# Patient Record
Sex: Female | Born: 1950 | Race: White | Hispanic: No | Smoking: Never smoker
Health system: Southern US, Community
[De-identification: ages and names within clinical notes are randomized; demographics above are authoritative.]

## PROBLEM LIST (undated history)

## (undated) DIAGNOSIS — E785 Hyperlipidemia, unspecified: Secondary | ICD-10-CM

## (undated) DIAGNOSIS — I499 Cardiac arrhythmia, unspecified: Secondary | ICD-10-CM

## (undated) DIAGNOSIS — F329 Major depressive disorder, single episode, unspecified: Secondary | ICD-10-CM

## (undated) DIAGNOSIS — M810 Age-related osteoporosis without current pathological fracture: Secondary | ICD-10-CM

## (undated) DIAGNOSIS — M1990 Unspecified osteoarthritis, unspecified site: Secondary | ICD-10-CM

## (undated) DIAGNOSIS — J189 Pneumonia, unspecified organism: Secondary | ICD-10-CM

## (undated) DIAGNOSIS — Z8719 Personal history of other diseases of the digestive system: Secondary | ICD-10-CM

## (undated) DIAGNOSIS — R197 Diarrhea, unspecified: Secondary | ICD-10-CM

## (undated) DIAGNOSIS — F32A Depression, unspecified: Secondary | ICD-10-CM

## (undated) DIAGNOSIS — L709 Acne, unspecified: Secondary | ICD-10-CM

## (undated) DIAGNOSIS — K635 Polyp of colon: Secondary | ICD-10-CM

## (undated) DIAGNOSIS — T884XXA Failed or difficult intubation, initial encounter: Secondary | ICD-10-CM

## (undated) DIAGNOSIS — K219 Gastro-esophageal reflux disease without esophagitis: Secondary | ICD-10-CM

## (undated) DIAGNOSIS — F419 Anxiety disorder, unspecified: Secondary | ICD-10-CM

## (undated) DIAGNOSIS — D649 Anemia, unspecified: Secondary | ICD-10-CM

## (undated) DIAGNOSIS — G4700 Insomnia, unspecified: Secondary | ICD-10-CM

## (undated) HISTORY — DX: Diarrhea, unspecified: R19.7

## (undated) HISTORY — PX: DIAGNOSTIC LAPAROSCOPY: SUR761

## (undated) HISTORY — DX: Polyp of colon: K63.5

## (undated) HISTORY — DX: Insomnia, unspecified: G47.00

## (undated) HISTORY — DX: Gastro-esophageal reflux disease without esophagitis: K21.9

## (undated) HISTORY — DX: Major depressive disorder, single episode, unspecified: F32.9

## (undated) HISTORY — PX: ABDOMINAL HYSTERECTOMY: SHX81

## (undated) HISTORY — DX: Hyperlipidemia, unspecified: E78.5

## (undated) HISTORY — PX: TONSILLECTOMY AND ADENOIDECTOMY: SHX28

## (undated) HISTORY — DX: Depression, unspecified: F32.A

## (undated) HISTORY — DX: Acne, unspecified: L70.9

## (undated) HISTORY — PX: TONSILLECTOMY: SUR1361

## (undated) HISTORY — PX: COLONOSCOPY W/ POLYPECTOMY: SHX1380

## (undated) HISTORY — DX: Age-related osteoporosis without current pathological fracture: M81.0

## (undated) HISTORY — PX: ENDOMETRIAL ABLATION: SHX621

---

## 1999-06-04 ENCOUNTER — Encounter: Admission: RE | Admit: 1999-06-04 | Discharge: 1999-06-04 | Payer: Self-pay | Admitting: Family Medicine

## 1999-10-15 ENCOUNTER — Encounter: Payer: Self-pay | Admitting: Family Medicine

## 1999-10-15 ENCOUNTER — Ambulatory Visit (HOSPITAL_COMMUNITY): Admission: RE | Admit: 1999-10-15 | Discharge: 1999-10-15 | Payer: Self-pay | Admitting: Family Medicine

## 1999-10-21 ENCOUNTER — Encounter: Payer: Self-pay | Admitting: Family Medicine

## 1999-10-21 ENCOUNTER — Ambulatory Visit (HOSPITAL_COMMUNITY): Admission: RE | Admit: 1999-10-21 | Discharge: 1999-10-21 | Payer: Self-pay | Admitting: Family Medicine

## 2000-04-03 ENCOUNTER — Other Ambulatory Visit: Admission: RE | Admit: 2000-04-03 | Discharge: 2000-04-03 | Payer: Self-pay | Admitting: Internal Medicine

## 2000-04-03 ENCOUNTER — Encounter (INDEPENDENT_AMBULATORY_CARE_PROVIDER_SITE_OTHER): Payer: Self-pay

## 2000-04-03 ENCOUNTER — Encounter: Payer: Self-pay | Admitting: Internal Medicine

## 2004-11-26 ENCOUNTER — Ambulatory Visit: Payer: Self-pay | Admitting: Family Medicine

## 2005-09-02 ENCOUNTER — Ambulatory Visit: Payer: Self-pay | Admitting: Internal Medicine

## 2005-09-24 ENCOUNTER — Emergency Department (HOSPITAL_COMMUNITY): Admission: EM | Admit: 2005-09-24 | Discharge: 2005-09-24 | Payer: Self-pay | Admitting: *Deleted

## 2005-09-29 ENCOUNTER — Ambulatory Visit: Payer: Self-pay | Admitting: Family Medicine

## 2005-11-22 ENCOUNTER — Ambulatory Visit: Payer: Self-pay | Admitting: Family Medicine

## 2005-11-29 ENCOUNTER — Ambulatory Visit: Payer: Self-pay | Admitting: Family Medicine

## 2005-12-13 ENCOUNTER — Ambulatory Visit: Payer: Self-pay | Admitting: Family Medicine

## 2006-04-18 ENCOUNTER — Ambulatory Visit: Payer: Self-pay | Admitting: Family Medicine

## 2006-06-20 ENCOUNTER — Ambulatory Visit: Payer: Self-pay | Admitting: Family Medicine

## 2006-09-28 ENCOUNTER — Ambulatory Visit: Payer: Self-pay | Admitting: Gastroenterology

## 2006-10-10 ENCOUNTER — Ambulatory Visit: Payer: Self-pay | Admitting: Gastroenterology

## 2006-11-14 ENCOUNTER — Ambulatory Visit: Payer: Self-pay | Admitting: Family Medicine

## 2006-11-14 LAB — CONVERTED CEMR LAB
ALT: 16 units/L (ref 0–40)
AST: 18 units/L (ref 0–37)
Albumin: 4.2 g/dL (ref 3.5–5.2)
Alkaline Phosphatase: 47 units/L (ref 39–117)
BUN: 16 mg/dL (ref 6–23)
Basophils Absolute: 0 10*3/uL (ref 0.0–0.1)
Basophils Relative: 0.1 % (ref 0.0–1.0)
Bilirubin, Direct: 0.1 mg/dL (ref 0.0–0.3)
CO2: 32 meq/L (ref 19–32)
Calcium: 9.7 mg/dL (ref 8.4–10.5)
Chloride: 108 meq/L (ref 96–112)
Cholesterol: 190 mg/dL (ref 0–200)
Creatinine, Ser: 0.9 mg/dL (ref 0.4–1.2)
Eosinophils Absolute: 0.1 10*3/uL (ref 0.0–0.6)
Eosinophils Relative: 3.3 % (ref 0.0–5.0)
GFR calc Af Amer: 83 mL/min
GFR calc non Af Amer: 69 mL/min
Glucose, Bld: 90 mg/dL (ref 70–99)
HCT: 35.7 % — ABNORMAL LOW (ref 36.0–46.0)
HDL: 77.2 mg/dL (ref >39.0)
Hemoglobin: 12.6 g/dL (ref 12.0–15.0)
LDL Cholesterol: 97 mg/dL (ref 0–99)
Lymphocytes Relative: 37.2 % (ref 12.0–46.0)
MCHC: 35.4 g/dL (ref 30.0–36.0)
MCV: 94.5 fL (ref 78.0–100.0)
Monocytes Absolute: 0.3 10*3/uL (ref 0.2–0.7)
Monocytes Relative: 9.1 % (ref 3.0–11.0)
Neutro Abs: 1.9 10*3/uL (ref 1.4–7.7)
Neutrophils Relative %: 50.3 % (ref 43.0–77.0)
Platelets: 283 10*3/uL (ref 150–400)
Potassium: 4.1 meq/L (ref 3.5–5.1)
RBC: 3.78 M/uL — ABNORMAL LOW (ref 3.87–5.11)
RDW: 12.6 % (ref 11.5–14.6)
Sodium: 143 meq/L (ref 135–145)
TSH: 1.31 microintl units/mL (ref 0.35–5.50)
Total Bilirubin: 1.1 mg/dL (ref 0.3–1.2)
Total CHOL/HDL Ratio: 2.5
Total Protein: 6.7 g/dL (ref 6.0–8.3)
Triglycerides: 79 mg/dL (ref 0–149)
VLDL: 16 mg/dL (ref 0–40)
WBC: 3.7 10*3/uL — ABNORMAL LOW (ref 4.5–10.5)

## 2006-11-28 ENCOUNTER — Ambulatory Visit: Payer: Self-pay | Admitting: Family Medicine

## 2007-03-28 DIAGNOSIS — G43909 Migraine, unspecified, not intractable, without status migrainosus: Secondary | ICD-10-CM | POA: Insufficient documentation

## 2007-03-28 DIAGNOSIS — L708 Other acne: Secondary | ICD-10-CM | POA: Insufficient documentation

## 2007-03-28 DIAGNOSIS — K219 Gastro-esophageal reflux disease without esophagitis: Secondary | ICD-10-CM

## 2007-03-28 DIAGNOSIS — R319 Hematuria, unspecified: Secondary | ICD-10-CM

## 2007-03-28 DIAGNOSIS — G4700 Insomnia, unspecified: Secondary | ICD-10-CM

## 2007-03-28 DIAGNOSIS — M810 Age-related osteoporosis without current pathological fracture: Secondary | ICD-10-CM

## 2007-03-28 HISTORY — DX: Gastro-esophageal reflux disease without esophagitis: K21.9

## 2007-03-28 HISTORY — DX: Hematuria, unspecified: R31.9

## 2007-03-28 HISTORY — DX: Age-related osteoporosis without current pathological fracture: M81.0

## 2007-05-31 ENCOUNTER — Encounter: Payer: Self-pay | Admitting: Family Medicine

## 2007-09-27 ENCOUNTER — Telehealth: Payer: Self-pay | Admitting: Family Medicine

## 2007-12-19 ENCOUNTER — Telehealth: Payer: Self-pay | Admitting: Family Medicine

## 2008-03-21 ENCOUNTER — Telehealth: Payer: Self-pay | Admitting: Family Medicine

## 2008-04-17 ENCOUNTER — Ambulatory Visit: Payer: Self-pay | Admitting: Family Medicine

## 2008-04-17 DIAGNOSIS — F329 Major depressive disorder, single episode, unspecified: Secondary | ICD-10-CM

## 2008-04-29 ENCOUNTER — Telehealth: Payer: Self-pay | Admitting: Family Medicine

## 2008-05-01 ENCOUNTER — Ambulatory Visit: Payer: Self-pay | Admitting: Family Medicine

## 2008-05-01 LAB — CONVERTED CEMR LAB
ALT: 18 units/L (ref 0–35)
AST: 16 units/L (ref 0–37)
Albumin: 4.5 g/dL (ref 3.5–5.2)
Alkaline Phosphatase: 50 units/L (ref 39–117)
BUN: 17 mg/dL (ref 6–23)
Basophils Absolute: 0 10*3/uL (ref 0.0–0.1)
Basophils Relative: 0.4 % (ref 0.0–3.0)
Bilirubin Urine: NEGATIVE
Bilirubin, Direct: 0.1 mg/dL (ref 0.0–0.3)
CO2: 31 meq/L (ref 19–32)
Calcium: 9.3 mg/dL (ref 8.4–10.5)
Chloride: 106 meq/L (ref 96–112)
Cholesterol: 176 mg/dL (ref 0–200)
Creatinine, Ser: 0.8 mg/dL (ref 0.4–1.2)
Eosinophils Absolute: 0.1 10*3/uL (ref 0.0–0.7)
Eosinophils Relative: 3.5 % (ref 0.0–5.0)
GFR calc Af Amer: 95 mL/min
GFR calc non Af Amer: 79 mL/min
Glucose, Bld: 80 mg/dL (ref 70–99)
Glucose, Urine, Semiquant: NEGATIVE
HCT: 37.3 % (ref 36.0–46.0)
HDL: 78.9 mg/dL (ref >39.0)
Hemoglobin: 12.7 g/dL (ref 12.0–15.0)
Ketones, urine, test strip: NEGATIVE
LDL Cholesterol: 83 mg/dL (ref 0–99)
Lymphocytes Relative: 33.7 % (ref 12.0–46.0)
MCHC: 34.2 g/dL (ref 30.0–36.0)
MCV: 98.2 fL (ref 78.0–100.0)
Monocytes Absolute: 0.3 10*3/uL (ref 0.1–1.0)
Monocytes Relative: 7.7 % (ref 3.0–12.0)
Neutro Abs: 2.4 10*3/uL (ref 1.4–7.7)
Neutrophils Relative %: 54.7 % (ref 43.0–77.0)
Nitrite: NEGATIVE
Platelets: 247 10*3/uL (ref 150–400)
Potassium: 4 meq/L (ref 3.5–5.1)
Protein, U semiquant: NEGATIVE
RBC: 3.8 M/uL — ABNORMAL LOW (ref 3.87–5.11)
RDW: 12.8 % (ref 11.5–14.6)
Sodium: 144 meq/L (ref 135–145)
Specific Gravity, Urine: 1.02
TSH: 1.22 microintl units/mL (ref 0.35–5.50)
Total Bilirubin: 0.7 mg/dL (ref 0.3–1.2)
Total CHOL/HDL Ratio: 2.2
Total Protein: 7.1 g/dL (ref 6.0–8.3)
Triglycerides: 73 mg/dL (ref 0–149)
Urobilinogen, UA: 0.2
VLDL: 15 mg/dL (ref 0–40)
WBC Urine, dipstick: NEGATIVE
WBC: 4.2 10*3/uL — ABNORMAL LOW (ref 4.5–10.5)
pH: 6

## 2008-05-08 ENCOUNTER — Ambulatory Visit: Payer: Self-pay | Admitting: Family Medicine

## 2008-05-08 DIAGNOSIS — Z860100 Personal history of colon polyps, unspecified: Secondary | ICD-10-CM

## 2008-05-08 HISTORY — DX: Personal history of colonic polyps: Z86.010

## 2008-05-08 HISTORY — DX: Personal history of colon polyps, unspecified: Z86.0100

## 2008-12-19 ENCOUNTER — Telehealth: Payer: Self-pay | Admitting: *Deleted

## 2008-12-23 ENCOUNTER — Telehealth: Payer: Self-pay | Admitting: Family Medicine

## 2009-05-20 ENCOUNTER — Encounter: Payer: Self-pay | Admitting: *Deleted

## 2009-07-24 ENCOUNTER — Ambulatory Visit: Payer: Self-pay | Admitting: Family Medicine

## 2009-07-24 LAB — CONVERTED CEMR LAB
ALT: 19 units/L (ref 0–35)
AST: 21 units/L (ref 0–37)
Albumin: 4 g/dL (ref 3.5–5.2)
Alkaline Phosphatase: 46 units/L (ref 39–117)
BUN: 15 mg/dL (ref 6–23)
Basophils Absolute: 0 10*3/uL (ref 0.0–0.1)
Basophils Relative: 0.7 % (ref 0.0–3.0)
Bilirubin Urine: NEGATIVE
Bilirubin, Direct: 0 mg/dL (ref 0.0–0.3)
CO2: 29 meq/L (ref 19–32)
Calcium: 9.3 mg/dL (ref 8.4–10.5)
Chloride: 111 meq/L (ref 96–112)
Cholesterol: 188 mg/dL (ref 0–200)
Creatinine, Ser: 0.8 mg/dL (ref 0.4–1.2)
Eosinophils Absolute: 0.1 10*3/uL (ref 0.0–0.7)
Eosinophils Relative: 3.5 % (ref 0.0–5.0)
GFR calc non Af Amer: 77.99 mL/min (ref >60)
Glucose, Bld: 75 mg/dL (ref 70–99)
HCT: 35.6 % — ABNORMAL LOW (ref 36.0–46.0)
HDL: 83.6 mg/dL (ref >39.00)
Hemoglobin: 11.6 g/dL — ABNORMAL LOW (ref 12.0–15.0)
Ketones, ur: NEGATIVE mg/dL
LDL Cholesterol: 87 mg/dL (ref 0–99)
Leukocytes, UA: NEGATIVE
Lymphocytes Relative: 44.2 % (ref 12.0–46.0)
Lymphs Abs: 1.6 10*3/uL (ref 0.7–4.0)
MCHC: 32.7 g/dL (ref 30.0–36.0)
MCV: 99.1 fL (ref 78.0–100.0)
Monocytes Absolute: 0.3 10*3/uL (ref 0.1–1.0)
Monocytes Relative: 7.6 % (ref 3.0–12.0)
Neutro Abs: 1.6 10*3/uL (ref 1.4–7.7)
Neutrophils Relative %: 44 % (ref 43.0–77.0)
Nitrite: NEGATIVE
Platelets: 248 10*3/uL (ref 150.0–400.0)
Potassium: 3.9 meq/L (ref 3.5–5.1)
RBC: 3.59 M/uL — ABNORMAL LOW (ref 3.87–5.11)
RDW: 13.3 % (ref 11.5–14.6)
Sodium: 145 meq/L (ref 135–145)
Specific Gravity, Urine: 1.02 (ref 1.000–1.030)
TSH: 1.26 microintl units/mL (ref 0.35–5.50)
Total Bilirubin: 0.5 mg/dL (ref 0.3–1.2)
Total CHOL/HDL Ratio: 2
Total Protein, Urine: NEGATIVE mg/dL
Total Protein: 6.7 g/dL (ref 6.0–8.3)
Triglycerides: 87 mg/dL (ref 0.0–149.0)
Urine Glucose: NEGATIVE mg/dL
Urobilinogen, UA: 0.2 (ref 0.0–1.0)
VLDL: 17.4 mg/dL (ref 0.0–40.0)
WBC: 3.6 10*3/uL — ABNORMAL LOW (ref 4.5–10.5)
pH: 5.5 (ref 5.0–8.0)

## 2009-08-03 ENCOUNTER — Ambulatory Visit: Payer: Self-pay | Admitting: Family Medicine

## 2009-08-03 ENCOUNTER — Encounter (INDEPENDENT_AMBULATORY_CARE_PROVIDER_SITE_OTHER): Payer: Self-pay | Admitting: *Deleted

## 2009-08-03 DIAGNOSIS — I059 Rheumatic mitral valve disease, unspecified: Secondary | ICD-10-CM

## 2009-08-03 HISTORY — DX: Rheumatic mitral valve disease, unspecified: I05.9

## 2009-08-10 ENCOUNTER — Ambulatory Visit: Payer: Self-pay | Admitting: Internal Medicine

## 2009-08-10 ENCOUNTER — Encounter: Payer: Self-pay | Admitting: Family Medicine

## 2009-08-26 ENCOUNTER — Ambulatory Visit: Payer: Self-pay | Admitting: Gastroenterology

## 2009-08-28 ENCOUNTER — Ambulatory Visit: Payer: Self-pay | Admitting: Gastroenterology

## 2009-09-01 ENCOUNTER — Encounter: Payer: Self-pay | Admitting: Gastroenterology

## 2009-12-16 ENCOUNTER — Telehealth: Payer: Self-pay | Admitting: Family Medicine

## 2010-05-05 ENCOUNTER — Encounter: Payer: Self-pay | Admitting: Family Medicine

## 2010-08-19 NOTE — Assessment & Plan Note (Signed)
Summary: cpx/no pap/njr/pt rescd//ccm   Vital Signs:  Patient profile:   60 year old female Height:      61.5 inches Weight:      124 pounds BMI:     23.13 Temp:     98.4 degrees F oral BP sitting:   130 / 72  (left arm) Cuff size:   regular  Vitals Entered By: Kern Reap CMA Duncan Dull) (August 03, 2009 2:06 PM)  Reason for Visit cpx  History of Present Illness: Tonya Pope is a 60 year old female, retired Engineer, civil (consulting) nonsmoker, who comes in today for evaluation of multiple issues.  She has a history of sleep dysfunction, for which he takes Ambien 10 mg nightly p.r.n.  She has trouble with vaginal dryness.  She uses Premarin vaginal cream twice weekly.  She also has migraine headaches, however, she's not had a migraine in over 6 months.  She takes calcium and vitamin D for bone health.  She takes Prilosec 20 mg q.a.m. for reflux esophagitis.  She also takes indomethacin 50 mg daily in the morning with food for chronic low back pain.  If he doesn't take the Indocin.  She hurts all day long.  However, the indomethacin is causing her to double up on her Prilosec to one twice a day.  She is requesting a GI consult for evaluation.  She has no difficulty swallowing.  She also takes Effexor, long-acting 75 mg q.a.m. for mild depression.  We also gave her a few Vicodin and Zofran to take p.r.n. for breakthrough migraine.  She does recheck here dental care.  Colonoscopy 2008 normal, tetanus, 2004, seasonal flu 2010.  She also has a history of fibrocystic breast disease skipped her mammogram in November.  Advised to call and get it set up now.  She had a uterus and right ovary removed for nonmalignant reasons  Allergies: 1)  Phenergan (Promethazine Hcl) 2)  Codeine Phosphate (Codeine Phosphate) 3)  Penicillin V Potassium (Penicillin V Potassium)  Past History:  Past medical, surgical, family and social histories (including risk factors) reviewed, and no changes noted (except as noted  below).  Past Medical History: Reviewed history from 05/08/2008 and no changes required. GERD Osteoporosis migraine headache acne insomnia asymptomatic hematuria Depression TAH nonmalignant reasons Colonic polyps, hx of  Past Surgical History: Reviewed history from 03/28/2007 and no changes required. Colon polypectomy Hysterectomy, TAH Tonsillectomy, adenoidectomy abdominal pain X2  Family History: Reviewed history from 05/08/2008 and no changes required. father had a history of hypertension, glaucoma.  Mother died at 56 from multiple medical problems.  She had strokes, arthritis, and diabetes, heart attack, hypertension.  One brother in good health.  Sister with a history of hypothyroidism and cholecystitis  Social History: Reviewed history from 04/17/2008 and no changes required.  Never Smoked Alcohol use-no Drug use-no Retired       2010  Review of Systems      See HPI  Physical Exam  General:  Well-developed,well-nourished,in no acute distress; alert,appropriate and cooperative throughout examination Head:  Normocephalic and atraumatic without obvious abnormalities. No apparent alopecia or balding. Eyes:  No corneal or conjunctival inflammation noted. EOMI. Perrla. Funduscopic exam benign, without hemorrhages, exudates or papilledema. Vision grossly normal. Ears:  External ear exam shows no significant lesions or deformities.  Otoscopic examination reveals clear canals, tympanic membranes are intact bilaterally without bulging, retraction, inflammation or discharge. Hearing is grossly normal bilaterally. Nose:  External nasal examination shows no deformity or inflammation. Nasal mucosa are pink and moist without lesions or  exudates. Mouth:  Oral mucosa and oropharynx without lesions or exudates.  Teeth in good repair. Neck:  No deformities, masses, or tenderness noted. Chest Wall:  No deformities, masses, or tenderness noted. Breasts:  No mass, nodules, thickening,  tenderness, bulging, retraction, inflamation, nipple discharge or skin changes noted.   Lungs:  Normal respiratory effort, chest expands symmetrically. Lungs are clear to auscultation, no crackles or wheezes. Heart:  the PMI is in the fifth intercostal space in the future.  Line.  No heaves, or thrills.  S1 is within normal limits.  There is a wide split S2 with a mitral valve regurgitation murmur Abdomen:  Bowel sounds positive,abdomen soft and non-tender without masses, organomegaly or hernias noted. Rectal:  No external abnormalities noted. Normal sphincter tone. No rectal masses or tenderness. Genitalia:  Pelvic Exam:        External: normal female genitalia without lesions or masses        Vagina: normal without lesions or masses        Cervix: normal without lesions or masses        Adnexa: normal bimanual exam without masses or fullness        Uterus: normal by palpation        Pap smear: not performed Msk:  No deformity or scoliosis noted of thoracic or lumbar spine.   Pulses:  R and L carotid,radial,femoral,dorsalis pedis and posterior tibial pulses are full and equal bilaterally Extremities:  No clubbing, cyanosis, edema, or deformity noted with normal full range of motion of all joints.   Neurologic:  No cranial nerve deficits noted. Station and gait are normal. Plantar reflexes are down-going bilaterally. DTRs are symmetrical throughout. Sensory, motor and coordinative functions appear intact. Skin:  Intact without suspicious lesions or rashes Cervical Nodes:  No lymphadenopathy noted Axillary Nodes:  No palpable lymphadenopathy Inguinal Nodes:  No significant adenopathy Psych:  Cognition and judgment appear intact. Alert and cooperative with normal attention span and concentration. No apparent delusions, illusions, hallucinations   Impression & Recommendations:  Problem # 1:  HEALTH MAINTENANCE EXAM (ICD-V70.0) Assessment Unchanged  Orders: Prescription Created  Electronically [id number])  Problem # 2:  DEPRESSION (ICD-311) Assessment: Improved  Her updated medication list for this problem includes:    Effexor Xr 75 Mg Xr24h-cap (Venlafaxine hcl) .Marland Kitchen... Take 1 tablet by mouth every morning  Orders: Prescription Created Electronically [phone])  Problem # 3:  INSOMNIA (ICD-780.52) Assessment: Improved  Her updated medication list for this problem includes:    Ambien 10 Mg Tabs (Zolpidem tartrate) .Marland Kitchen... Take 1 tablet by mouth at bedtime  Orders: Prescription Created Electronically [phone])  Problem # 4:  MIGRAINE HEADACHE (ICD-346.90) Assessment: Improved  Her updated medication list for this problem includes:    Indomethacin 50 Mg Caps (Indomethacin) .Marland Kitchen... Take 1 capsule by mouth once daily    Zomig Zmt 5 Mg Tbdp (Zolmitriptan) .Marland Kitchen... Take 1 tablet by mouth    Vicodin 5-500 Mg Tabs (Hydrocodone-acetaminophen) .Marland Kitchen... As needed  Orders: Prescription Created Electronically [phone])  Problem # 5:  OSTEOPOROSIS (ICD-733.00) Assessment: Unchanged  Orders: T-Bone Densitometry (65784) Prescription Created Electronically [phone])  Problem # 6:  GERD (ICD-530.81) Assessment: Deteriorated  Her updated medication list for this problem includes:    Prilosec Otc 20 Mg Tbec (Omeprazole magnesium) .Marland Kitchen... Daily  Orders: Prescription Created Electronically [phone]) Gastroenterology Referral (GI)  Problem # 7:  MITRAL VALVE PROLAPSE (ICD-424.0) Assessment: Unchanged  Orders: EKG w/ Interpretation (93000)  Complete Medication List: 1)  Ambien 10 Mg Tabs (  Zolpidem tartrate) .... Take 1 tablet by mouth at bedtime 2)  Indomethacin 50 Mg Caps (Indomethacin) .... Take 1 capsule by mouth once daily 3)  Premarin 0.625 Mg/gm Crea (Estrogens, conjugated) .... Insert 1 a small amount into vagina as directed 4)  Zomig Zmt 5 Mg Tbdp (Zolmitriptan) .... Take 1 tablet by mouth 5)  Caltrate 600+d 600-400 Mg-unit Tabs (Calcium carbonate-vitamin d) .... 2 daily 6)   Prilosec Otc 20 Mg Tbec (Omeprazole magnesium) .... Daily 7)  Colace 100 Mg Caps (Docusate sodium) .... Daily 8)  Vicodin 5-500 Mg Tabs (Hydrocodone-acetaminophen) .... As needed 9)  Effexor Xr 75 Mg Xr24h-cap (Venlafaxine hcl) .... Take 1 tablet by mouth every morning 10)  Lutein 6 Mg Caps (Lutein) .... Take one tab once daily 11)  Zofran 4 Mg Tabs (Ondansetron hcl) .... Take 1 tablet by mouth three times a day as needed nausea  Patient Instructions: 1)  continue your current medications, except take one Prilosec twice a day. 2)  We will get u  set up for a GI consult. 3)  Call to get set up  your mammogram ASAP. 4)  We will get you set up for a follow-up bone density Prescriptions: ZOFRAN 4 MG TABS (ONDANSETRON HCL) Take 1 tablet by mouth three times a day as needed nausea  #10 x 1   Entered and Authorized by:   Roderick Pee MD   Signed by:   Roderick Pee MD on 08/03/2009   Method used:   Print then Give to Patient   RxID:   [id number] EFFEXOR XR 75 MG XR24H-CAP (VENLAFAXINE HCL) Take 1 tablet by mouth every morning  #100 x 3   Entered and Authorized by:   Roderick Pee MD   Signed by:   Roderick Pee MD on 08/03/2009   Method used:   Print then Give to Patient   RxID:   [id number] VICODIN 5-500 MG  TABS (HYDROCODONE-ACETAMINOPHEN) as needed  #30 x 2   Entered and Authorized by:   Roderick Pee MD   Signed by:   Roderick Pee MD on 08/03/2009   Method used:   Print then Give to Patient   RxID:   [id number] ZOMIG ZMT 5 MG TBDP (ZOLMITRIPTAN) Take 1 tablet by mouth  #6 x 4   Entered and Authorized by:   Roderick Pee MD   Signed by:   Roderick Pee MD on 08/03/2009   Method used:   Print then Give to Patient   RxID:   [id number] PREMARIN 0.625 MG/GM CREA (ESTROGENS, CONJUGATED) Insert 1 a small amount into vagina as directed  #3 tubes x 4   Entered and Authorized by:   Roderick Pee MD   Signed by:   Roderick Pee MD on 08/03/2009   Method used:    Print then Give to Patient   RxID:   [id number] INDOMETHACIN 50 MG CAPS (INDOMETHACIN) Take 1 capsule by mouth once daily  #100 x 3   Entered and Authorized by:   Roderick Pee MD   Signed by:   Roderick Pee MD on 08/03/2009   Method used:   Print then Give to Patient   RxID:   [id number] AMBIEN 10 MG TABS (ZOLPIDEM TARTRATE) Take 1 tablet by mouth at bedtime  #100 x 3   Entered and Authorized by:   Roderick Pee MD   Signed by:   Roderick Pee MD on 08/03/2009  Method used:   Print then Give to Patient   RxID:   [id number]

## 2010-08-19 NOTE — Miscellaneous (Signed)
Summary: BONE DENSITY  Clinical Lists Changes  Orders: Added new Test order of T-Bone Densitometry (77080) - Signed Added new Test order of T-Lumbar Vertebral Assessment (77082) - Signed 

## 2010-08-19 NOTE — Letter (Signed)
Summary: New Patient letter  [facility]  [address] [place], [place] [address]   Phone: [phone]  Fax: [phone]       08/03/2009 MRN: [id number]  [facility] [address] [place], [place]  [address]  Dear Ms. Shankel,  Welcome to the Gastroenterology Division at [facility].    You are scheduled to see Dr.  Arlyce Dice on  08-26-09 at 11:15AM on the 3rd floor at [facility], [address]  We ask that you try to arrive at our office 15 minutes prior to your appointment time to allow for check-in.  We would like you to complete the enclosed self-administered evaluation form prior to your visit and bring it with you on the day of your appointment.  We will review it with you.  Also, please bring a complete list of all your medications or, if you prefer, bring the medication bottles and we will list them.  Please bring your insurance card so that we may make a copy of it.  If your insurance requires a referral to see a specialist, please bring your referral form from your primary care physician.  Co-payments are due at the time of your visit and may be paid by cash, check or credit card.     Your office visit will consist of a consult with your physician (includes a physical exam), any laboratory testing he/she may order, scheduling of any necessary diagnostic testing (e.g. x-ray, ultrasound, CT-scan), and scheduling of a procedure (e.g. Endoscopy, Colonoscopy) if required.  Please allow enough time on your schedule to allow for any/all of these possibilities.    If you cannot keep your appointment, please call [phone] to cancel or reschedule prior to your appointment date.  This allows [place] the opportunity to schedule an appointment for another patient in need of care.  If you do not cancel or reschedule by 5 p.m. the business day prior to your appointment date, you will be charged a $50.00 late cancellation/no-show fee.    Thank you for  choosing  Gastroenterology for your medical needs.  We appreciate the opportunity to care for you.  Please visit [place] at our website  to learn more about our practice.                     Sincerely,                                                             The Gastroenterology Division

## 2010-08-19 NOTE — Progress Notes (Signed)
Summary: refill  Phone Note Refill Request Message from:  Fax from Pharmacy on December 16, 2009 10:34 AM  Refills Requested: Medication #1:  EFFEXOR XR 75 MG XR24H-CAP Take 1 tablet by mouth every morning Initial call taken by: Kern Reap CMA Duncan Dull),  December 16, 2009 10:34 AM    Prescriptions: EFFEXOR XR 75 MG XR24H-CAP (VENLAFAXINE HCL) Take 1 tablet by mouth every morning  #100 x 3   Entered by:   Kern Reap CMA (AAMA)   Authorized by:   Roderick Pee MD   Signed by:   Kern Reap CMA (AAMA) on 12/16/2009   Method used:   Electronically to        [facility]* (mail-order)             ,          Ph: [phone]       Fax: [phone]   RxID:   [id number]

## 2010-08-19 NOTE — Procedures (Signed)
Summary: colonoscopy   Colonoscopy  Procedure date:  10/10/2006  Findings:      Results: Normal. Location:  [facility].    Comments:      Repeat colonoscopy in 10 years.    Colonoscopy  Procedure date:  10/10/2006  Findings:      Results: Normal. Location:  [facility].    Comments:      Repeat colonoscopy in 10 years.   Patient Name: Tonya Pope, Tonya Pope MRN: [id number] Procedure Procedures: Colonoscopy CPT: [phone].  Personnel: Endoscopist: Barbette Hair. Arlyce Dice, MD.  Patient Consent: Procedure, Alternatives, Risks and Benefits discussed, consent obtained, from patient.  Indications  Surveillance of: Adenomatous Polyp(s). 2004.  History  Current Medications: Patient is not currently taking Coumadin.  Pre-Exam Physical: Performed Oct 10, 2006. Cardio-pulmonary exam, HEENT exam , Abdominal exam, Mental status exam WNL.  Comments: Patient history reviewed/updated, physical performed prior to initiation of sedation?yes Exam Exam: Extent of exam reached: Cecum, extent intended: Cecum.  The cecum was identified by appendiceal orifice and IC valve. Time to Cecum: 00:04: 26. Time for Withdrawl: 00:06:05. Colon retroflexion performed. ASA Classification: I. Tolerance: good.  Monitoring: Pulse and BP monitoring, Oximetry used. Supplemental O2 given. at 2 Liters.  Colon Prep Used Miralax for colon prep. Prep results: good.  Sedation Meds: Patient assessed and found to be appropriate for moderate (conscious) sedation. Sedation was managed by the Endoscopist. Fentanyl 75 mcg. given IV. Versed 8 mg. given IV.  Findings - NORMAL EXAM: Cecum to Rectum.  NORMAL EXAM: Cecum.  NORMAL EXAM: Rectum.   Assessment Normal examination.  Events  Unplanned Interventions: No intervention was required.  Unplanned Events: There were no complications. Plans  Post Exam Instructions: Post sedation instructions given.  Patient Education: Patient given  standard instructions for: a normal exam.  Disposition: After procedure patient sent to recovery. After recovery patient sent home.  Scheduling/Referral: Colonoscopy, to Barbette Hair. Arlyce Dice, MD, around Oct 09, 2016.    This report was created from the original endoscopy report, which was reviewed and signed by the above listed endoscopist.    cc. Eugenio Hoes Todd,MD

## 2010-08-19 NOTE — Letter (Signed)
Summary: Results Letter  [facility]  [address] [place], [place] [address]   Phone: [phone]  Fax: [phone]        September 01, 2009 MRN: [id number]    [facility] [address] [place], [place]  [address]    Dear Tonya Pope,   Your biopsies demonstrated inflammatory changes only.    Please follow the recommendations previously discussed.  Should you have any immediate concerns or questions, feel free to contact me at the office.    Sincerely,  Barbette Hair. Arlyce Dice, M.D., [facility]          Sincerely,  Louis Meckel MD  This letter has been electronically signed by your physician.  Appended Document: Results Letter Letter mailed 2.16.11

## 2010-08-19 NOTE — Procedures (Signed)
Summary: Upper Endoscopy  Patient: Tonya Pope Note: All result statuses are Final unless otherwise noted.  Tests: (1) Upper Endoscopy (EGD)   EGD Upper Endoscopy       DONE     [facility]     [address]     [place], [place]  [address]           ENDOSCOPY PROCEDURE REPORT           PATIENT:  Tonya, Pope  MR#:  [id number]     BIRTHDATE:  1951/01/27, 58 yrs. old  GENDER:  female           ENDOSCOPIST:  Barbette Hair. Arlyce Dice, MD     Referred by:  Eugenio Hoes Tawanna Cooler, M.D.           PROCEDURE DATE:  08/28/2009     PROCEDURE:  EGD with biopsy     ASA CLASS:  Class II     INDICATIONS:  GERD           MEDICATIONS:   Fentanyl 50 mcg IV, Versed 10 mg IV, 0.6cc     simethancone 0.6 cc PO     TOPICAL ANESTHETIC:  Exactacain Spray           DESCRIPTION OF PROCEDURE:   After the risks benefits and     alternatives of the procedure were thoroughly explained, informed     consent was obtained.  The LB [id number] endoscope was     introduced through the mouth and advanced to the third portion of     the duodenum, without limitations.  The instrument was slowly     withdrawn as the mucosa was fully examined.     <<PROCEDUREIMAGES>>           irregular Z-line at the gastroesophageal junction. Multiple     biopsies were obtained and sent to pathology to r/o Barrett's     esophagus (see image2 and image1).  Otherwise the examination was     normal.    Retroflexed views revealed no abnormalities.    The     scope was then withdrawn from the patient and the procedure     completed.           COMPLICATIONS:  None           ENDOSCOPIC IMPRESSION:     1) Irregular Z-line at the gastroesophageal junction - r/o     Barrett's esophagus     2) Otherwise normal examination     RECOMMENDATIONS:     1) await biopsy results     2) continue current medications           REPEAT EXAM:  You will receive a letter from Dr. Arlyce Dice in 1-2     weeks, after reviewing the final  pathology, with followup     recommendations.           ______________________________     Barbette Hair Arlyce Dice, MD           CC:           n.     eSIGNED:   Barbette Hair. Calena Salem at 08/28/2009 09:13 AM           Ted, Leonhart, [id number]  Note: An exclamation mark (!) indicates a result that was not dispersed into the flowsheet. Document Creation Date: 08/28/2009 9:13 AM _______________________________________________________________________  (1) Order result status: Final Collection or observation date-time: 08/28/2009 09:06 Requested date-time:  Receipt date-time:  Reported date-time:  Referring Physician:   Ordering Physician: Melvia Heaps [phone]) Specimen Source:  Source: Launa Grill Order Number: [id number] Lab site:

## 2010-08-19 NOTE — Assessment & Plan Note (Signed)
Summary: REFLUX ESOPHAGITITS/YF    History of Present Illness Visit Type: Initial Consult Primary GI MD: Melvia Heaps MD [facility] Primary Provider: Kelle Darting, MD Requesting Provider: Kelle Darting, MD Chief Complaint: Patient having alot of heartburn which she has had for several years and is wanting to know is she might need a EGD. She is taking the Prilosec OTC and does have to increase it from time to time to a twice a day dose.  History of Present Illness:   Tonya Pope is a pleasant 60 year old white female referred at the request of Dr. Tawanna Cooler for evaluation of reflux.  She has had reflux for several years characterized by pyrosis.  This was initially controlled with an H2 receptor antagonist.  She was switched to Prilosec  which she has taken for several years.  She has occasional breakthrough pyrosis.  She denies dysphagia.   GI Review of Systems    Reports acid reflux, belching, and  bloating.      Denies abdominal pain, chest pain, dysphagia with liquids, dysphagia with solids, heartburn, loss of appetite, nausea, vomiting, vomiting blood, weight loss, and  weight gain.      Reports constipation.     Denies anal fissure, black tarry stools, change in bowel habit, diarrhea, diverticulosis, fecal incontinence, heme positive stool, hemorrhoids, irritable bowel syndrome, jaundice, light color stool, liver problems, rectal bleeding, and  rectal pain. Preventive Screening-Counseling & Management  Alcohol-Tobacco     Smoking Status: quit    Current Medications (verified): 1)  Ambien 10 Mg Tabs (Zolpidem Tartrate) .... Take 1/2  Tablet By Mouth At Bedtime 2)  Indomethacin 50 Mg Caps (Indomethacin) .... Take 1 Capsule By Mouth Once Daily 3)  Premarin 0.625 Mg/gm Crea (Estrogens, Conjugated) .... Insert 1 A Small Amount Into Vagina As Directed 4)  Zomig Zmt 5 Mg Tbdp (Zolmitriptan) .... Take 1 Tablet By Mouth 5)  Caltrate 600+d 600-400 Mg-Unit  Tabs (Calcium Carbonate-Vitamin D) .... 2  Daily 6)  Prilosec Otc 20 Mg  Tbec (Omeprazole Magnesium) .... Daily 7)  Colace 100 Mg  Caps (Docusate Sodium) .... Daily 8)  Vicodin 5-500 Mg  Tabs (Hydrocodone-Acetaminophen) .... As Needed 9)  Effexor Xr 75 Mg Xr24h-Cap (Venlafaxine Hcl) .... Take 1 Tablet By Mouth Every Morning 10)  Lutein 6 Mg Caps (Lutein) .... Take One Tab Once Daily 11)  Zofran 4 Mg Tabs (Ondansetron Hcl) .... Take 1 Tablet By Mouth Three Times A Day As Needed Nausea 12)  Caltrate+d Plus 1200-800 Mg-Unit Tabs .... Take Two By Mouth Once Daily 13)  Multivitamins  Tabs (Multiple Vitamin) .... Take One By Mouth Once Daily 14)  Feosol 200 (65 Fe) Mg Tabs (Ferrous Sulfate Dried) .... Take One By Mouth Once Daily  Allergies (verified): 1)  Phenergan (Promethazine Hcl) 2)  Codeine Phosphate (Codeine Phosphate) 3)  Penicillin V Potassium (Penicillin V Potassium)  Past History:  Past Medical History: Reviewed history from 08/25/2009 and no changes required. GERD Osteoporosis migraine headache acne insomnia asymptomatic hematuria Depression TAH nonmalignant reasons Colonic polyps, hx of 2001  Past Surgical History: Reviewed history from 03/28/2007 and no changes required. Colon polypectomy Hysterectomy, TAH Tonsillectomy, adenoidectomy abdominal pain X2  Family History: father had a history of hypertension, glaucoma.  Mother died at 75 from multiple medical problems.  She had strokes, arthritis, and diabetes, heart attack, hypertension.  One brother in good health.  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  Alcohol Use - yes 2 per day Smoking Status:  quit  Review of Systems       The patient complains of back pain, heart murmur, heart rhythm changes, and  sleeping problems.  The patient denies allergy/sinus, anemia, anxiety-new, arthritis/joint pain, blood in urine, breast changes/lumps, change in vision, confusion, cough, coughing up blood, depression-new, fainting, fatigue, fever, headaches-new, hearing problems, itching, menstrual pain, muscle pains/cramps, night sweats, nosebleeds, pregnancy symptoms, shortness of breath, skin rash, sore throat, swelling of feet/legs, swollen lymph glands, thirst - excessive , urination - excessive , urination changes/pain, urine leakage, vision changes, and voice change.    Vital Signs:  Patient profile:   60 year old female Height:      61 inches Weight:      125 pounds BMI:     23.70 Pulse rate:   64 / minute Pulse rhythm:   regular BP sitting:   118 / 62  (left arm) Cuff size:   regular  Vitals Entered By: Harlow Mares CMA Duncan Dull) (August 26, 2009 11:58 AM)  Physical Exam  Additional Exam:  on physical exam she is a healthy-appearing female  skin: anicteric HEENT: normocephalic; PEERLA; no nasal or pharyngeal abnormalities neck: supple nodes: no cervical lymphadenopathy chest: clear to ausculatation and percussion heart: no murmurs, gallops, or rubs; there is a mid systolic click abd: soft, nontender; BS normoactive; no abdominal masses, tenderness, organomegaly rectal: deferred ext: no cynanosis, clubbing, edema skeletal: no deformities neuro: oriented x 3; no focal abnormalities    Impression & Recommendations:  Problem # 1:  GERD (ICD-530.81)  This has been  a stable problem which is well controlled with PPI therapy.  Barrett's esophagus what to be ruled out.  Recommendations #1 continue Prilosec #2 upper endoscopy  Risks, alternatives, and complications of the procedure, including bleeding, perforation, and possible need for surgery, were explained to the patient.  Patient's questions were answered.  Orders: EGD (EGD)  Problem # 2:  MITRAL VALVE PROLAPSE  (ICD-424.0) Assessment: Comment Only  Patient Instructions: 1)  Conscious Sedation brochure given.  2)  Upper Endoscopy brochure given.  3)  GI Reflux brochure given.  4)  Continue Prilosec 5)  Your EGD is scheduled for 08/28/2009 at 8:30am 6)  The medication list was reviewed and reconciled.  All changed / newly prescribed medications were explained.  A complete medication list was provided to the patient / caregiver.

## 2010-08-19 NOTE — Miscellaneous (Signed)
Summary: flu vaccine   Clinical Lists Changes  Observations: Added new observation of FLU VAX: Historical (04/30/2010 11:52)        Immunization History:  Influenza Immunization History:    Influenza:  historical (04/30/2010)

## 2010-08-19 NOTE — Procedures (Signed)
Summary: colonoscopy   Colonoscopy  Procedure date:  04/03/2000  Findings:      Results: Polyp.  Location:  [facility].    Comments:      Repeat colonoscopy in 5 years.   Colonoscopy  Procedure date:  04/03/2000  Findings:      Results: Polyp.  Location:  [facility].    Comments:      Repeat colonoscopy in 5 years.  Patient Name: Tonya Pope, Tonya Pope MRN: [id number] Procedure Procedures: Colonoscopy CPT: [phone].    with polypectomy. CPT: [id number].  Personnel: Endoscopist: Wilhemina Bonito. Marina Goodell, MD.  Patient Consent: Procedure, Alternatives, Risks and Benefits discussed, consent obtained, Consent to be contacted was given Indications  Surveillance of: Last exam: Jul, 1996.  Average Risk Screening Routine.  History  Pre-Exam Physical: Performed Apr 03, 2000. Cardio-pulmonary exam, Rectal exam, HEENT exam , Abdominal exam, Extremity exam, Neurological exam WNL.  Exam Exam: Extent of exam reached: Cecum, extent intended: Cecum.  The cecum was identified by appendiceal orifice and IC valve. Colon retroflexion performed. Images taken. ASA Classification: I. Tolerance: excellent.  Monitoring: Pulse and BP monitoring, Oximetry used. Supplemental O2 given.  Colon Prep Used Golytely for colon prep. Prep results: excellent.  Fluoroscopy: Fluoroscopy was not used.  Sedation Meds: Fentanyl 150 mcg. Versed 13  Findings POLYP: Descending Colon, Maximum size: 5 mm. Procedure:  snare with cautery, removed, retrieved, Polyp sent to pathology. ICD9: Colon Polyps: 211.3.   Assessment Abnormal examination, see findings above.  Diagnoses: 211.3: Colon Polyps.   Events  Unplanned Interventions: No intervention was required.  Unplanned Events: There were no complications. Plans Disposition: After procedure patient sent to recovery. After recovery patient sent home.  Scheduling/Referral: Await pathology to schedule patient. Colonoscopy, to Wilhemina Bonito.  Marina Goodell, MD, around Apr 03, 2005.    This report was created from the original endoscopy report, which was reviewed and signed by the above listed endoscopist.

## 2010-08-19 NOTE — Letter (Signed)
Summary: New Patient letter  [facility]  [address] [place], [place] [address]   Phone: [phone]  Fax: [phone]       08/03/2009 MRN: [id number]  [facility] [address] [place], [place]  [address]  Dear Ms. Eisner,  Welcome to the Gastroenterology Division at [facility].    You are scheduled to see Dr.  Arlyce Dice on 08-26-09 at 11:15am on the 3rd floor at [facility], [address]  We ask that you try to arrive at our office 15 minutes prior to your appointment time to allow for check-in.  We would like you to complete the enclosed self-administered evaluation form prior to your visit and bring it with you on the day of your appointment.  We will review it with you.  Also, please bring a complete list of all your medications or, if you prefer, bring the medication bottles and we will list them.  Please bring your insurance card so that we may make a copy of it.  If your insurance requires a referral to see a specialist, please bring your referral form from your primary care physician.  Co-payments are due at the time of your visit and may be paid by cash, check or credit card.     Your office visit will consist of a consult with your physician (includes a physical exam), any laboratory testing he/she may order, scheduling of any necessary diagnostic testing (e.g. x-ray, ultrasound, CT-scan), and scheduling of a procedure (e.g. Endoscopy, Colonoscopy) if required.  Please allow enough time on your schedule to allow for any/all of these possibilities.    If you cannot keep your appointment, please call [phone] to cancel or reschedule prior to your appointment date.  This allows [place] the opportunity to schedule an appointment for another patient in need of care.  If you do not cancel or reschedule by 5 p.m. the business day prior to your appointment date, you will be charged a $50.00 late cancellation/no-show fee.    Thank you for choosing  [facility] for your medical needs.  We appreciate the opportunity to care for you.  Please visit [place] at our website  to learn more about our practice.                     Sincerely,                                                             The Gastroenterology Division

## 2010-08-19 NOTE — Letter (Signed)
Summary: EGD Instructions  [facility]  [address] [place], [place] [address]   Phone: [phone]  Fax: [phone]       Tonya Pope    02/10/1951    MRN: [id number]       Procedure Day /Date:FRIDAY 08/28/2009     Arrival Time: 7;30AM     Procedure Time:8:30AM     Location of Procedure:                    [facility] (4th Floor)    PREPARATION FOR ENDOSCOPY   On 2/112011 F THE PROCEDURE:  1.   No solid foods, milk or milk products are allowed after midnight the night before your procedure.  2.   Do not drink anything colored red or purple.  Avoid juices with pulp.  No orange juice.  3.  You may drink clear liquids until6:30AMh is 2 hours before your procedure.                                                                                                CLEAR LIQUIDS INCLUDE: Water Jello Ice Popsicles Tea (sugar ok, no milk/cream) Powdered fruit flavored drinks Coffee (sugar ok, no milk/cream) Gatorade Juice: apple, white grape, white cranberry  Lemonade Clear bullion, consomm, broth Carbonated beverages (any kind) Strained chicken noodle soup Hard Candy   MEDICATION INSTRUCTIONS  Unless otherwise instructed, you should take regular prescription medications with a small sip of water as early as possible the morning of your procedure.            OTHER INSTRUCTIONS  You will need a responsible adult at least 61 years of age to accompany you and drive you home.   This person must remain in the waiting room during your procedure.  Wear loose fitting clothing that is easily removed.  Leave jewelry and other valuables at home.  However, you may wish to bring a book to read or an iPod/MP3 player to listen to music as you wait for your procedure to start.  Remove all body piercing jewelry and leave at home.  Total time from sign-in until discharge is approximately 2-3 hours.  You should go home directly after your  procedure and rest.  You can resume normal activities the day after your procedure.  The day of your procedure you should not:   Drive   Make legal decisions   Operate machinery   Drink alcohol   Return to work  You will receive specific instructions about eating, activities and medications before you leave.    The above instructions have been reviewed and explained to me by   _______________________    I fully understand and can verbalize these instructions _____________________________ Date _________

## 2010-09-28 ENCOUNTER — Other Ambulatory Visit: Payer: Self-pay | Admitting: Family Medicine

## 2010-11-25 ENCOUNTER — Other Ambulatory Visit: Payer: Self-pay | Admitting: Family Medicine

## 2010-11-25 NOTE — Telephone Encounter (Signed)
Time for an office visit 

## 2010-12-29 ENCOUNTER — Other Ambulatory Visit: Payer: Self-pay | Admitting: Family Medicine

## 2011-01-04 ENCOUNTER — Telehealth: Payer: Self-pay | Admitting: *Deleted

## 2011-01-04 MED ORDER — VENLAFAXINE HCL ER 75 MG PO CP24: 75.0000 mg | ORAL_CAPSULE | Freq: Every day | ORAL | Status: DC

## 2011-01-04 NOTE — Telephone Encounter (Signed)
rx sent

## 2011-01-04 NOTE — Telephone Encounter (Signed)
Pt states [facility] advised her that refill request for effexor ER 75mg  was denied.  Pt states she has appt with Dr. Tawanna Cooler next month and needs refill to be approved.

## 2011-01-10 ENCOUNTER — Other Ambulatory Visit (INDEPENDENT_AMBULATORY_CARE_PROVIDER_SITE_OTHER): Payer: 59

## 2011-01-10 DIAGNOSIS — Z0000 Encounter for general adult medical examination without abnormal findings: Secondary | ICD-10-CM

## 2011-01-10 LAB — CBC WITH DIFFERENTIAL/PLATELET
Eosinophils Absolute: 0.1 10*3/uL (ref 0.0–0.7)
MCHC: 34.2 g/dL (ref 30.0–36.0)
MCV: 100.8 fl — ABNORMAL HIGH (ref 78.0–100.0)
Monocytes Absolute: 0.3 10*3/uL (ref 0.1–1.0)
Neutrophils Relative %: 46.2 % (ref 43.0–77.0)
Platelets: 184 10*3/uL (ref 150.0–400.0)
RDW: 13.9 % (ref 11.5–14.6)

## 2011-01-10 LAB — POCT URINALYSIS DIPSTICK
Ketones, UA: NEGATIVE
Leukocytes, UA: NEGATIVE
Nitrite, UA: NEGATIVE
Protein, UA: NEGATIVE
pH, UA: 6

## 2011-01-10 LAB — BASIC METABOLIC PANEL
BUN: 19 mg/dL (ref 6–23)
CO2: 28 mEq/L (ref 19–32)
Chloride: 106 mEq/L (ref 96–112)
Creatinine, Ser: 0.8 mg/dL (ref 0.4–1.2)
Glucose, Bld: 92 mg/dL (ref 70–99)

## 2011-01-10 LAB — LIPID PANEL
Cholesterol: 218 mg/dL — ABNORMAL HIGH (ref 0–200)
HDL: 101.6 mg/dL (ref >39.00)
Total CHOL/HDL Ratio: 2
Triglycerides: 36 mg/dL (ref 0.0–149.0)

## 2011-01-10 LAB — HEPATIC FUNCTION PANEL
Bilirubin, Direct: 0.1 mg/dL (ref 0.0–0.3)
Total Bilirubin: 0.6 mg/dL (ref 0.3–1.2)

## 2011-01-14 ENCOUNTER — Encounter: Payer: Self-pay | Admitting: Family Medicine

## 2011-01-17 ENCOUNTER — Encounter: Payer: Self-pay | Admitting: Family Medicine

## 2011-01-17 ENCOUNTER — Ambulatory Visit (INDEPENDENT_AMBULATORY_CARE_PROVIDER_SITE_OTHER): Payer: 59 | Admitting: Family Medicine

## 2011-01-17 DIAGNOSIS — G4700 Insomnia, unspecified: Secondary | ICD-10-CM

## 2011-01-17 DIAGNOSIS — M810 Age-related osteoporosis without current pathological fracture: Secondary | ICD-10-CM

## 2011-01-17 DIAGNOSIS — G43909 Migraine, unspecified, not intractable, without status migrainosus: Secondary | ICD-10-CM

## 2011-01-17 DIAGNOSIS — I059 Rheumatic mitral valve disease, unspecified: Secondary | ICD-10-CM

## 2011-01-17 DIAGNOSIS — K219 Gastro-esophageal reflux disease without esophagitis: Secondary | ICD-10-CM

## 2011-01-17 DIAGNOSIS — F329 Major depressive disorder, single episode, unspecified: Secondary | ICD-10-CM

## 2011-01-17 MED ORDER — ZOLPIDEM TARTRATE 10 MG PO TABS: 10.0000 mg | ORAL_TABLET | Freq: Every evening | ORAL | Status: DC | PRN

## 2011-01-17 MED ORDER — OMEPRAZOLE 20 MG PO CPDR: 20.0000 mg | DELAYED_RELEASE_CAPSULE | Freq: Every day | ORAL | Status: DC

## 2011-01-17 MED ORDER — INDOMETHACIN 50 MG PO CAPS: 50.0000 mg | ORAL_CAPSULE | Freq: Two times a day (BID) | ORAL | Status: DC

## 2011-01-17 MED ORDER — VENLAFAXINE HCL ER 75 MG PO CP24: 75.0000 mg | ORAL_CAPSULE | Freq: Every day | ORAL | Status: DC

## 2011-01-17 MED ORDER — HYDROCODONE-ACETAMINOPHEN 5-500 MG PO TABS: 1.0000 | ORAL_TABLET | Freq: Four times a day (QID) | ORAL | Status: DC | PRN

## 2011-01-17 MED ORDER — ESTROGENS, CONJUGATED 0.625 MG/GM VA CREA: 1.0000 g | TOPICAL_CREAM | Freq: Every day | VAGINAL | Status: DC

## 2011-01-17 NOTE — Progress Notes (Signed)
  Subjective:    Patient ID: Tonya Pope, female    DOB: 1950-10-29, 60 y.o.   MRN: [id number]  HPIPam is a 60 year old, married female, nonsmoker, who comes in today for a general physical examination because of the following issues She has a history of low bone density and takes calcium, vitamin D, and exercises on a regular basis.  She has a history of postmenopausal vaginal dryness for which he uses Premarin vaginal cream twice weekly.  She has a history of osteoarthritis, which he takes Indocin, 50 mg daily.  She has a history of reflux esophagitis, for which he takes Prozac 20 mg daily.  She is a history of mild depression, for which he takes Effexor 75 mg daily.  She also has a history of migraine headaches.  However, this year.  Her migraines have virtually stopped.  She also has a history of insomnia for which he takes Ambien 10 mg about 5 tabs per month.  She also takes over-the-counter magnesium, and Claritin for allergic rhinitis.  She does have trouble with snoring, but no fatigue.  No symptoms of sleep apnea.  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  She is off lactose and feels better.  Mammogram 2011, tetanus, 2004, colonoscopy, 2000, information given on shingles.     Review of Systems  Constitutional: Negative.   HENT: Negative.   Eyes: Negative.   Respiratory: Negative.   Cardiovascular: Negative.   Gastrointestinal: Negative.   Genitourinary: Negative.   Musculoskeletal: Negative.   Neurological: Negative.   Hematological: Negative.   Psychiatric/Behavioral: Negative.        Objective:   Physical Exam  Constitutional: She appears well-developed and well-nourished.  HENT:  Head: Normocephalic and atraumatic.  Right Ear: External ear normal.  Left Ear: External ear normal.  Nose: Nose normal.  Mouth/Throat: Oropharynx is clear and moist.  Eyes: EOM are normal. Pupils are equal, round, and reactive to light.  Neck: Normal  range of motion. Neck supple. No thyromegaly present.  Cardiovascular: Normal rate, regular rhythm, normal heart sounds and intact distal pulses.  Exam reveals no gallop and no friction rub.   No murmur heard. Pulmonary/Chest: Effort normal and breath sounds normal.  Abdominal: Soft. Bowel sounds are normal. She exhibits no distension and no mass. There is no tenderness. There is no rebound.  Genitourinary: Vagina normal. Guaiac negative stool. No vaginal discharge found.       Bilateral breast exam shows thickening in both breasts from the 3 to the 9 o'clock position on the periphery.  This is inconsistent in the past with her fibrocystic changes  Musculoskeletal: Normal range of motion.  Lymphadenopathy:    She has no cervical adenopathy.  Neurological: She is alert. She has normal reflexes. No cranial nerve deficit. She exhibits normal muscle tone. Coordination normal.  Skin: Skin is warm and dry.  Psychiatric: She has a normal mood and affect. Her behavior is normal. Judgment and thought content normal.          Assessment & Plan:  Healthy female.  Osteopenia.  Continue calcium, vitamin D, and exercise.  Postmenopausal vaginal dryness.  Continue determine vaginal cream.  Osteoarthritis.  Continue Indocin 50 mg daily.  Reflux esophagitis.  Continue Prilosec 20 daily.  Depression.  Continue Effexor 75 daily.  Migraine headaches have stopped.  Sleep dysfunction.  Ambien p.r.n.  Chest the takes an occasional Vicodin about one tab per month when she is having severe pain.

## 2011-01-17 NOTE — Patient Instructions (Signed)
Continue current medications.  Follow-up in one year or sooner if any problems remember to do a thorough breast exam monthly and get an annual mammogram

## 2011-12-08 ENCOUNTER — Other Ambulatory Visit: Payer: Self-pay | Admitting: Family Medicine

## 2012-01-05 ENCOUNTER — Other Ambulatory Visit: Payer: Self-pay | Admitting: Family Medicine

## 2012-03-22 ENCOUNTER — Other Ambulatory Visit: Payer: Self-pay | Admitting: Family Medicine

## 2012-03-29 ENCOUNTER — Other Ambulatory Visit: Payer: Self-pay | Admitting: Family Medicine

## 2012-04-06 ENCOUNTER — Other Ambulatory Visit: Payer: Self-pay | Admitting: Family Medicine

## 2012-04-13 ENCOUNTER — Other Ambulatory Visit (INDEPENDENT_AMBULATORY_CARE_PROVIDER_SITE_OTHER): Payer: 59

## 2012-04-13 DIAGNOSIS — Z0000 Encounter for general adult medical examination without abnormal findings: Secondary | ICD-10-CM

## 2012-04-13 LAB — CBC WITH DIFFERENTIAL/PLATELET
Basophils Absolute: 0 K/uL (ref 0.0–0.1)
Basophils Relative: 0.6 % (ref 0.0–3.0)
Eosinophils Absolute: 0.1 K/uL (ref 0.0–0.7)
Eosinophils Relative: 3.8 % (ref 0.0–5.0)
HCT: 36.4 % (ref 36.0–46.0)
Hemoglobin: 12 g/dL (ref 12.0–15.0)
Lymphocytes Relative: 34.1 % (ref 12.0–46.0)
Lymphs Abs: 1.2 K/uL (ref 0.7–4.0)
MCHC: 32.9 g/dL (ref 30.0–36.0)
MCV: 100.1 fl — ABNORMAL HIGH (ref 78.0–100.0)
Monocytes Absolute: 0.3 K/uL (ref 0.1–1.0)
Monocytes Relative: 8.5 % (ref 3.0–12.0)
Neutro Abs: 1.8 K/uL (ref 1.4–7.7)
Neutrophils Relative %: 53 % (ref 43.0–77.0)
Platelets: 190 K/uL (ref 150.0–400.0)
RBC: 3.64 Mil/uL — ABNORMAL LOW (ref 3.87–5.11)
RDW: 13.8 % (ref 11.5–14.6)
WBC: 3.4 K/uL — ABNORMAL LOW (ref 4.5–10.5)

## 2012-04-13 LAB — BASIC METABOLIC PANEL
CO2: 28 mEq/L (ref 19–32)
Chloride: 105 mEq/L (ref 96–112)
Glucose, Bld: 95 mg/dL (ref 70–99)
Potassium: 4.9 mEq/L (ref 3.5–5.1)
Sodium: 140 mEq/L (ref 135–145)

## 2012-04-13 LAB — HEPATIC FUNCTION PANEL
AST: 23 U/L (ref 0–37)
Albumin: 4.3 g/dL (ref 3.5–5.2)

## 2012-04-13 LAB — LDL CHOLESTEROL, DIRECT: Direct LDL: 82.2 mg/dL

## 2012-04-13 LAB — LIPID PANEL
HDL: 97.7 mg/dL (ref >39.00)
Triglycerides: 41 mg/dL (ref 0.0–149.0)

## 2012-04-13 LAB — POCT URINALYSIS DIPSTICK
Bilirubin, UA: NEGATIVE
Glucose, UA: NEGATIVE
Nitrite, UA: NEGATIVE

## 2012-04-13 LAB — TSH: TSH: 1.39 u[IU]/mL (ref 0.35–5.50)

## 2012-05-01 ENCOUNTER — Encounter: Payer: Self-pay | Admitting: Family Medicine

## 2012-05-01 ENCOUNTER — Ambulatory Visit (INDEPENDENT_AMBULATORY_CARE_PROVIDER_SITE_OTHER): Payer: 59 | Admitting: Family Medicine

## 2012-05-01 VITALS — BP 120/80 | Temp 98.7°F | Ht 63.0 in | Wt 132.0 lb

## 2012-05-01 DIAGNOSIS — G4700 Insomnia, unspecified: Secondary | ICD-10-CM

## 2012-05-01 DIAGNOSIS — K219 Gastro-esophageal reflux disease without esophagitis: Secondary | ICD-10-CM

## 2012-05-01 DIAGNOSIS — R319 Hematuria, unspecified: Secondary | ICD-10-CM

## 2012-05-01 DIAGNOSIS — G43909 Migraine, unspecified, not intractable, without status migrainosus: Secondary | ICD-10-CM

## 2012-05-01 DIAGNOSIS — I059 Rheumatic mitral valve disease, unspecified: Secondary | ICD-10-CM

## 2012-05-01 DIAGNOSIS — M810 Age-related osteoporosis without current pathological fracture: Secondary | ICD-10-CM

## 2012-05-01 DIAGNOSIS — Z0000 Encounter for general adult medical examination without abnormal findings: Secondary | ICD-10-CM

## 2012-05-01 DIAGNOSIS — F329 Major depressive disorder, single episode, unspecified: Secondary | ICD-10-CM

## 2012-05-01 DIAGNOSIS — Z23 Encounter for immunization: Secondary | ICD-10-CM

## 2012-05-01 DIAGNOSIS — N760 Acute vaginitis: Secondary | ICD-10-CM

## 2012-05-01 MED ORDER — OMEPRAZOLE 20 MG PO CPDR: 20.0000 mg | DELAYED_RELEASE_CAPSULE | Freq: Every day | ORAL | Status: DC

## 2012-05-01 MED ORDER — ZOLPIDEM TARTRATE 5 MG PO TABS: ORAL_TABLET | ORAL | Status: DC

## 2012-05-01 MED ORDER — VENLAFAXINE HCL ER 75 MG PO CP24: 75.0000 mg | ORAL_CAPSULE | Freq: Every day | ORAL | Status: DC

## 2012-05-01 MED ORDER — TRAMADOL HCL 50 MG PO TABS: 50.0000 mg | ORAL_TABLET | Freq: Three times a day (TID) | ORAL | Status: DC | PRN

## 2012-05-01 MED ORDER — ESTROGENS, CONJUGATED 0.625 MG/GM VA CREA: 1.0000 g | TOPICAL_CREAM | Freq: Every day | VAGINAL | Status: DC

## 2012-05-01 MED ORDER — INDOMETHACIN 50 MG PO CAPS: 50.0000 mg | ORAL_CAPSULE | Freq: Two times a day (BID) | ORAL | Status: DC

## 2012-05-01 NOTE — Progress Notes (Signed)
  Subjective:    Patient ID: Tonya Pope, female    DOB: 22-Jun-1951, 61 y.o.   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 We discussed switching from Vicodin to tramadol  Because of some osteoarthritis she takes indomethacin 50 mg twice a day with food. She also takes Claritin 10 mg for allergic round nidus, Prilosec 20 mg for reflux and Effexor long-acting 75 mg daily for mild depression  He takes Ambien 5 mg one half tab each bedtime for chronic insomnia.  She gets routine eye care, dental care, BSE monthly, overdue for mammogram, colonoscopy and GI, tetanus booster 2004, we booster today, seasonal flu shot today.   Review of Systems  Constitutional: Negative.   HENT: Negative.   Eyes: Negative.   Respiratory: Negative.   Cardiovascular: Negative.   Gastrointestinal: Negative.   Genitourinary: Negative.   Musculoskeletal: Negative.   Neurological: Negative.   Hematological: Negative.   Psychiatric/Behavioral: Negative.        Objective:   Physical Exam  Constitutional: She appears well-developed and well-nourished.  HENT:  Head: Normocephalic and atraumatic.  Right Ear: External ear normal.  Left Ear: External ear normal.  Nose: Nose normal.  Mouth/Throat: Oropharynx is clear and moist.  Eyes: EOM are normal. Pupils are equal, round, and reactive to light.  Neck: Normal range of motion. Neck supple. No thyromegaly present.  Cardiovascular: Normal rate, regular rhythm, normal heart sounds and intact distal pulses.  Exam reveals no gallop and no friction rub.   No murmur heard. Pulmonary/Chest: Effort normal and breath sounds normal.    Abdominal: Soft. Bowel sounds are normal. She exhibits no distension and no mass. There is no tenderness. There is no rebound.  Genitourinary: Vagina normal. Guaiac negative stool. No vaginal discharge found.       Bilateral breast exam normal except for diffuse fibrocystic changes  Musculoskeletal: Normal range of motion.  Lymphadenopathy:    She has no cervical adenopathy.  Neurological: She is alert. She has normal reflexes. No cranial nerve deficit. She exhibits normal muscle tone. Coordination normal.  Skin: Skin is warm and dry.  Psychiatric: She has a normal mood and affect. Her behavior is normal. Judgment and thought content normal.          Assessment & Plan:  Healthy female  Postmenopausal vaginal dryness continue hormonal cream twice weekly  Neck pain tramadol when necessary  Osteoarthritis indomethacin 50 mg twice a day  Allergic rhinitis Claritin 10 mg daily  Reflux esophagitis Prilosec 20 mg daily  Mild depression continued continue Effexor 75 mg long acting daily  Chronic insomnia continue Ambien 5 mg one half tab each bedtime  Migraine headaches resolved none x2 years  Fibrocystic breast changes and encouraged BSE monthly and you mammography

## 2012-05-01 NOTE — Patient Instructions (Signed)
Return in one year or sooner if any problems  Remember to call and get set up for your mammogram this fall

## 2012-06-28 ENCOUNTER — Other Ambulatory Visit: Payer: Self-pay | Admitting: Family Medicine

## 2012-07-13 ENCOUNTER — Other Ambulatory Visit: Payer: Self-pay | Admitting: Family Medicine

## 2012-08-08 ENCOUNTER — Emergency Department (HOSPITAL_COMMUNITY)
Admission: EM | Admit: 2012-08-08 | Discharge: 2012-08-08 | Disposition: A | Discharge status: Home / Self Care | Payer: 59 | Attending: Emergency Medicine | Admitting: Emergency Medicine

## 2012-08-08 ENCOUNTER — Emergency Department (HOSPITAL_COMMUNITY): Payer: 59

## 2012-08-08 ENCOUNTER — Encounter (HOSPITAL_COMMUNITY): Payer: Self-pay | Admitting: Emergency Medicine

## 2012-08-08 DIAGNOSIS — IMO0002 Reserved for concepts with insufficient information to code with codable children: Secondary | ICD-10-CM | POA: Insufficient documentation

## 2012-08-08 DIAGNOSIS — Y92009 Unspecified place in unspecified non-institutional (private) residence as the place of occurrence of the external cause: Secondary | ICD-10-CM | POA: Insufficient documentation

## 2012-08-08 DIAGNOSIS — Z8679 Personal history of other diseases of the circulatory system: Secondary | ICD-10-CM | POA: Insufficient documentation

## 2012-08-08 DIAGNOSIS — Z8659 Personal history of other mental and behavioral disorders: Secondary | ICD-10-CM | POA: Insufficient documentation

## 2012-08-08 DIAGNOSIS — S139XXA Sprain of joints and ligaments of unspecified parts of neck, initial encounter: Secondary | ICD-10-CM | POA: Insufficient documentation

## 2012-08-08 DIAGNOSIS — Z872 Personal history of diseases of the skin and subcutaneous tissue: Secondary | ICD-10-CM | POA: Insufficient documentation

## 2012-08-08 DIAGNOSIS — S060X9A Concussion with loss of consciousness of unspecified duration, initial encounter: Secondary | ICD-10-CM | POA: Insufficient documentation

## 2012-08-08 DIAGNOSIS — T148XXA Other injury of unspecified body region, initial encounter: Secondary | ICD-10-CM

## 2012-08-08 DIAGNOSIS — G4700 Insomnia, unspecified: Secondary | ICD-10-CM | POA: Insufficient documentation

## 2012-08-08 DIAGNOSIS — M810 Age-related osteoporosis without current pathological fracture: Secondary | ICD-10-CM | POA: Insufficient documentation

## 2012-08-08 DIAGNOSIS — Y9389 Activity, other specified: Secondary | ICD-10-CM | POA: Insufficient documentation

## 2012-08-08 DIAGNOSIS — S161XXA Strain of muscle, fascia and tendon at neck level, initial encounter: Secondary | ICD-10-CM

## 2012-08-08 DIAGNOSIS — S0003XA Contusion of scalp, initial encounter: Secondary | ICD-10-CM | POA: Insufficient documentation

## 2012-08-08 DIAGNOSIS — S060XAA Concussion with loss of consciousness status unknown, initial encounter: Secondary | ICD-10-CM | POA: Insufficient documentation

## 2012-08-08 DIAGNOSIS — Z87891 Personal history of nicotine dependence: Secondary | ICD-10-CM | POA: Insufficient documentation

## 2012-08-08 DIAGNOSIS — K219 Gastro-esophageal reflux disease without esophagitis: Secondary | ICD-10-CM | POA: Insufficient documentation

## 2012-08-08 DIAGNOSIS — Z860100 Personal history of colon polyps, unspecified: Secondary | ICD-10-CM | POA: Insufficient documentation

## 2012-08-08 DIAGNOSIS — W010XXA Fall on same level from slipping, tripping and stumbling without subsequent striking against object, initial encounter: Secondary | ICD-10-CM | POA: Insufficient documentation

## 2012-08-08 DIAGNOSIS — S1093XA Contusion of unspecified part of neck, initial encounter: Secondary | ICD-10-CM | POA: Insufficient documentation

## 2012-08-08 DIAGNOSIS — Z7982 Long term (current) use of aspirin: Secondary | ICD-10-CM | POA: Insufficient documentation

## 2012-08-08 MED ORDER — METHOCARBAMOL 500 MG PO TABS: 500.0000 mg | ORAL_TABLET | Freq: Two times a day (BID) | ORAL | Status: DC

## 2012-08-08 MED ORDER — TRAMADOL HCL 50 MG PO TABS
50.0000 mg | ORAL_TABLET | Freq: Once | ORAL | Status: AC
  Administered 2012-08-08: 50 mg via ORAL
  Filled 2012-08-08: qty 1

## 2012-08-08 MED ORDER — FAMOTIDINE 20 MG PO TABS: 20.0000 mg | ORAL_TABLET | Freq: Two times a day (BID) | ORAL | Status: DC

## 2012-08-08 MED ORDER — TRAMADOL HCL 50 MG PO TABS: 50.0000 mg | ORAL_TABLET | Freq: Four times a day (QID) | ORAL | Status: DC | PRN

## 2012-08-08 MED ORDER — METHOCARBAMOL 500 MG PO TABS
500.0000 mg | ORAL_TABLET | Freq: Once | ORAL | Status: AC
  Administered 2012-08-08: 500 mg via ORAL
  Filled 2012-08-08: qty 1

## 2012-08-08 MED ORDER — IBUPROFEN 600 MG PO TABS: 600.0000 mg | ORAL_TABLET | Freq: Four times a day (QID) | ORAL | Status: DC | PRN

## 2012-08-08 NOTE — ED Notes (Signed)
Pt transported to radiology.

## 2012-08-08 NOTE — ED Notes (Signed)
Per EMS pt came from home. Pt was checking her mail and slipped backward on ice and hit the back of her head. No LOC, no neck or back pain. Pt is Ox4. Hematoma noted to back of head. No other injuries or deformities noted.

## 2012-08-08 NOTE — ED Notes (Signed)
Pt returned from radiology.

## 2012-08-08 NOTE — ED Provider Notes (Signed)
History     CSN: [id number]  Arrival date & time 08/08/12  1329   First MD Initiated Contact with Patient 08/08/12 1347      Chief Complaint  Patient presents with  . Fall    (Consider location/radiation/quality/duration/timing/severity/associated sxs/prior treatment) HPI Comments: Pt cokes in with cc of fall. Pt states that stepped outside her home, slipped due to the ice and fell right on to her back and hit her head. She has a headache in the back of her head, and tail bone pain with some lateral pain over the neck. Pt has no nausea, vomiting, visual complains, seizures, altered mental status, loss of consciousness, new weakness, or numbness, no gait instability. Pt has ambulated sicne the fall.  Patient is a 62 y.o. female presenting with fall. The history is provided by the patient.  Fall Associated symptoms include headaches. Pertinent negatives include no abdominal pain, no nausea and no vomiting.    Past Medical History  Diagnosis Date  . GERD (gastroesophageal reflux disease)   . OP (osteoporosis)   . Migraine   . Acne   . Insomnia   . Depression   . Colon polyps     Past Surgical History  Procedure Date  . Abdominal hysterectomy   . Colonoscopy w/ polypectomy   . Tonsillectomy and adenoidectomy     Family History  Problem Relation Age of Onset  . Stroke Mother   . Arthritis Mother   . Diabetes Mother   . Heart disease Mother   . Hypertension Mother   . Hyperlipidemia Father   . Glaucoma Father   . Thyroid disease Sister   . Cancer Other     breast,colon    History  Substance Use Topics  . Smoking status: Former Games developer  . Smokeless tobacco: Not on file  . Alcohol Use:     OB History    Grav Para Term Preterm Abortions TAB SAB Ect Mult Living                  Review of Systems  Constitutional: Negative for activity change.  HENT: Negative for neck pain.   Respiratory: Negative for shortness of breath.   Cardiovascular: Negative for  chest pain.  Gastrointestinal: Negative for nausea, vomiting and abdominal pain.  Genitourinary: Negative for dysuria.  Musculoskeletal: Positive for myalgias and back pain.  Neurological: Positive for headaches.    Allergies  Codeine phosphate; Penicillins; and Promethazine hcl  Home Medications   Current Outpatient Rx  Name  Route  Sig  Dispense  Refill  . ASPIRIN-ACETAMINOPHEN-CAFFEINE 250-250-65 MG PO TABS   Oral   Take 1 tablet by mouth every 6 (six) hours as needed. For migraine         . ONE-A-DAY CALCIUM PLUS PO   Oral   Take 1 tablet by mouth daily.          Marland Kitchen ESTROGENS, CONJUGATED 0.625 MG/GM VA CREA   Vaginal   Place 0.25 Applicatorfuls vaginally daily.   42.5 g   11   . INDOMETHACIN 50 MG PO CAPS   Oral   Take 1 capsule (50 mg total) by mouth 2 (two) times daily with a meal.   200 capsule   3   . OMEPRAZOLE 20 MG PO CPDR   Oral   Take 1 capsule (20 mg total) by mouth daily.   100 capsule   3   . TRAMADOL HCL 50 MG PO TABS   Oral   Take 1  tablet (50 mg total) by mouth every 8 (eight) hours as needed for pain.   50 tablet   3   . VENLAFAXINE HCL ER 75 MG PO CP24   Oral   Take 1 capsule (75 mg total) by mouth daily.   100 capsule   3   . ZOLPIDEM TARTRATE 5 MG PO TABS   Oral   Take 2.5 mg by mouth at bedtime as needed. For sleep           BP 147/75  Pulse 73  Temp 98.6 F (37 C) (Oral)  Resp 16  SpO2 99%  Physical Exam  Nursing note and vitals reviewed. Constitutional: She is oriented to person, place, and time. She appears well-developed and well-nourished.  HENT:  Head: Normocephalic and atraumatic.       No midline c-spine tenderness, pt able to turn head to 45 degrees bilaterally without any pain and able to flex neck to the chest and extend without any pain or neurologic symptoms.  Eyes: EOM are normal. Pupils are equal, round, and reactive to light.  Neck: Neck supple.  Cardiovascular: Normal rate, regular rhythm and normal  heart sounds.   No murmur heard. Pulmonary/Chest: Effort normal. No respiratory distress.  Abdominal: Soft. She exhibits no distension. There is no tenderness. There is no rebound and no guarding.  Musculoskeletal:       Head to toe evaluation shows + hematoma over the occiput, but no bleeding of the scalp, no facial abrasions, step offs, crepitus, no tenderness to palpation of the bilateral upper and lower extremities, no gross deformities, no chest tenderness, no pelvic pain.  Pt also has coccyx pain, and slight tenderness lateral to the coccyx  Neurological: She is alert and oriented to person, place, and time.  Skin: Skin is warm and dry.    ED Course  Procedures (including critical care time)  Labs Reviewed - No data to display Dg Sacrum/coccyx  08/08/2012  *RADIOLOGY REPORT*  Clinical Data: Larey Seat.  Sacral pain.  SACRUM AND COCCYX - 2+ VIEW  Comparison: None  Findings: The pubic symphysis and SI joints are intact.  The sacrum appears normal.  No definite fracture of the sacrum or coccyx.  No pubic rami fractures.  IMPRESSION: No acute bony findings.   Original Report Authenticated By: Rudie Meyer, M.D.    Ct Head Wo Contrast  08/08/2012  *RADIOLOGY REPORT*  Clinical Data: Larey Seat and hit head  CT HEAD WITHOUT CONTRAST  Technique:  Contiguous axial images were obtained from the base of the skull through the vertex without contrast.  Comparison: None.  Findings: Ventricle size is normal.  Mild chronic microvascular ischemic change in the white matter.  No acute infarct.  Negative for hemorrhage or mass lesion.  No subdural hemorrhage is present. Negative for skull fracture.  IMPRESSION: No acute abnormality.   Original Report Authenticated By: Janeece Riggers, M.D.      No diagnosis found.    MDM  DDx includes: - Mechanical falls - ICH - Fractures - Contusions - Soft tissue injury  Pt comes in with cc of fall. Will get CT head due to hematoma and headaches. Cspine cleared  clinically.  Will get sacral xrays.  Suspect contusions.  Derwood Kaplan, MD 08/08/12 1507

## 2012-10-04 ENCOUNTER — Telehealth: Payer: Self-pay | Admitting: Family Medicine

## 2012-10-04 DIAGNOSIS — N760 Acute vaginitis: Secondary | ICD-10-CM

## 2012-10-04 MED ORDER — ESTROGENS, CONJUGATED 0.625 MG/GM VA CREA: 1.0000 g | TOPICAL_CREAM | Freq: Every day | VAGINAL | Status: DC

## 2012-10-04 MED ORDER — ZOLPIDEM TARTRATE 5 MG PO TABS: 2.5000 mg | ORAL_TABLET | Freq: Every evening | ORAL | Status: DC | PRN

## 2012-10-04 NOTE — Telephone Encounter (Signed)
Pt called and stated that her insurance switched to [employer] for mail services. They are requesting that a copy of her zolpidem (AMBIEN) 5 MG tablet RX, and her conjugated estrogens (PREMARIN) vaginal cream RX be faxed to them. Please assist.

## 2012-10-05 NOTE — Telephone Encounter (Signed)
Rx faxed to pharmacy  

## 2013-02-06 ENCOUNTER — Telehealth: Payer: Self-pay | Admitting: Family Medicine

## 2013-02-06 DIAGNOSIS — M810 Age-related osteoporosis without current pathological fracture: Secondary | ICD-10-CM

## 2013-02-06 MED ORDER — INDOMETHACIN 50 MG PO CAPS: 50.0000 mg | ORAL_CAPSULE | Freq: Every day | ORAL | Status: DC

## 2013-02-06 NOTE — Telephone Encounter (Signed)
Pt needs indocin 50 mg once a day #90 with 3 refills sent to [facility]. Pt stated she does not take med twice a day.

## 2013-02-08 ENCOUNTER — Telehealth: Payer: Self-pay | Admitting: Family Medicine

## 2013-02-08 DIAGNOSIS — M810 Age-related osteoporosis without current pathological fracture: Secondary | ICD-10-CM

## 2013-02-08 NOTE — Telephone Encounter (Signed)
Rx was sent to [facility] so Rx was not sent to [facility]

## 2013-02-08 NOTE — Telephone Encounter (Signed)
Pt states she called a few days ago and request rx refill for indocin 50mg  to be sent to [facility].  However pharmacy has been changed to [facility].  Please cancel request sent to [facility] and send to [facility].

## 2013-04-04 ENCOUNTER — Other Ambulatory Visit: Payer: Self-pay | Admitting: *Deleted

## 2013-04-04 DIAGNOSIS — K219 Gastro-esophageal reflux disease without esophagitis: Secondary | ICD-10-CM

## 2013-04-04 MED ORDER — OMEPRAZOLE 20 MG PO CPDR: 20.0000 mg | DELAYED_RELEASE_CAPSULE | Freq: Every day | ORAL | Status: DC

## 2013-07-01 ENCOUNTER — Other Ambulatory Visit: Payer: Self-pay | Admitting: Family Medicine

## 2013-07-04 ENCOUNTER — Telehealth: Payer: Self-pay | Admitting: Family Medicine

## 2013-07-04 DIAGNOSIS — F329 Major depressive disorder, single episode, unspecified: Secondary | ICD-10-CM

## 2013-07-04 DIAGNOSIS — M810 Age-related osteoporosis without current pathological fracture: Secondary | ICD-10-CM

## 2013-07-04 DIAGNOSIS — K219 Gastro-esophageal reflux disease without esophagitis: Secondary | ICD-10-CM

## 2013-07-04 NOTE — Telephone Encounter (Signed)
Pt made cpe  First avaliable in march. Can you refill these until then? omeprazole (PRILOSEC) 20 MG capsule  1/ /day venlafaxine XR (EFFEXOR-XR) 75 MG 24 hr capsule  1/day indomethacin (INDOCIN) 50 MG capsule   2 / day zolpidem (AMBIEN) 5 MG tablet Pt almost our of all meds. New pharm info:  [facility]

## 2013-07-05 MED ORDER — OMEPRAZOLE 20 MG PO CPDR: 20.0000 mg | DELAYED_RELEASE_CAPSULE | Freq: Every day | ORAL | Status: DC

## 2013-07-05 MED ORDER — INDOMETHACIN 50 MG PO CAPS: 50.0000 mg | ORAL_CAPSULE | Freq: Every day | ORAL | Status: DC

## 2013-07-05 MED ORDER — VENLAFAXINE HCL ER 75 MG PO CP24: 75.0000 mg | ORAL_CAPSULE | Freq: Every day | ORAL | Status: DC

## 2013-07-05 NOTE — Telephone Encounter (Signed)
Refill sent except Ambien..will need doc signature.

## 2013-07-08 MED ORDER — ZOLPIDEM TARTRATE 5 MG PO TABS: 2.5000 mg | ORAL_TABLET | Freq: Every evening | ORAL | Status: DC | PRN

## 2013-09-10 ENCOUNTER — Other Ambulatory Visit: Payer: 59

## 2013-09-17 ENCOUNTER — Encounter: Payer: 59 | Admitting: Family Medicine

## 2013-09-20 ENCOUNTER — Other Ambulatory Visit: Payer: Self-pay | Admitting: Family Medicine

## 2013-10-22 ENCOUNTER — Other Ambulatory Visit (INDEPENDENT_AMBULATORY_CARE_PROVIDER_SITE_OTHER): Payer: 59

## 2013-10-22 DIAGNOSIS — Z0000 Encounter for general adult medical examination without abnormal findings: Secondary | ICD-10-CM

## 2013-10-22 LAB — CBC WITH DIFFERENTIAL/PLATELET
BASOS PCT: 0.2 % (ref 0.0–3.0)
Basophils Absolute: 0 10*3/uL (ref 0.0–0.1)
EOS PCT: 3.7 % (ref 0.0–5.0)
Eosinophils Absolute: 0.2 10*3/uL (ref 0.0–0.7)
HCT: 38.4 % (ref 36.0–46.0)
HEMOGLOBIN: 12.8 g/dL (ref 12.0–15.0)
LYMPHS PCT: 39 % (ref 12.0–46.0)
Lymphs Abs: 1.6 10*3/uL (ref 0.7–4.0)
MCHC: 33.5 g/dL (ref 30.0–36.0)
MCV: 97.4 fl (ref 78.0–100.0)
MONOS PCT: 7.3 % (ref 3.0–12.0)
Monocytes Absolute: 0.3 10*3/uL (ref 0.1–1.0)
NEUTROS ABS: 2.1 10*3/uL (ref 1.4–7.7)
Neutrophils Relative %: 49.8 % (ref 43.0–77.0)
Platelets: 176 10*3/uL (ref 150.0–400.0)
RBC: 3.94 Mil/uL (ref 3.87–5.11)
RDW: 13.3 % (ref 11.5–14.6)
WBC: 4.1 10*3/uL — ABNORMAL LOW (ref 4.5–10.5)

## 2013-10-22 LAB — LIPID PANEL
CHOL/HDL RATIO: 3
Cholesterol: 200 mg/dL (ref 0–200)
HDL: 78.3 mg/dL (ref >39.00)
LDL Cholesterol: 111 mg/dL — ABNORMAL HIGH (ref 0–99)
Triglycerides: 53 mg/dL (ref 0.0–149.0)
VLDL: 10.6 mg/dL (ref 0.0–40.0)

## 2013-10-22 LAB — BASIC METABOLIC PANEL
BUN: 20 mg/dL (ref 6–23)
CALCIUM: 9.8 mg/dL (ref 8.4–10.5)
CO2: 28 meq/L (ref 19–32)
Chloride: 105 mEq/L (ref 96–112)
Creatinine, Ser: 0.8 mg/dL (ref 0.4–1.2)
GFR: 73.7 mL/min (ref >60.00)
Glucose, Bld: 97 mg/dL (ref 70–99)
Potassium: 5 mEq/L (ref 3.5–5.1)
SODIUM: 140 meq/L (ref 135–145)

## 2013-10-22 LAB — HEPATIC FUNCTION PANEL
ALBUMIN: 4.4 g/dL (ref 3.5–5.2)
ALK PHOS: 60 U/L (ref 39–117)
ALT: 26 U/L (ref 0–35)
AST: 21 U/L (ref 0–37)
Bilirubin, Direct: 0.1 mg/dL (ref 0.0–0.3)
TOTAL PROTEIN: 7.2 g/dL (ref 6.0–8.3)
Total Bilirubin: 1 mg/dL (ref 0.3–1.2)

## 2013-10-22 LAB — TSH: TSH: 1.51 u[IU]/mL (ref 0.35–5.50)

## 2013-10-29 ENCOUNTER — Encounter: Payer: 59 | Admitting: Family Medicine

## 2013-10-31 ENCOUNTER — Ambulatory Visit (INDEPENDENT_AMBULATORY_CARE_PROVIDER_SITE_OTHER): Payer: 59 | Admitting: Family Medicine

## 2013-10-31 ENCOUNTER — Encounter: Payer: Self-pay | Admitting: Family Medicine

## 2013-10-31 VITALS — BP 130/80 | Temp 98.4°F | Ht 63.25 in | Wt 127.0 lb

## 2013-10-31 DIAGNOSIS — G4700 Insomnia, unspecified: Secondary | ICD-10-CM

## 2013-10-31 DIAGNOSIS — R319 Hematuria, unspecified: Secondary | ICD-10-CM

## 2013-10-31 DIAGNOSIS — M1610 Unilateral primary osteoarthritis, unspecified hip: Secondary | ICD-10-CM

## 2013-10-31 DIAGNOSIS — M810 Age-related osteoporosis without current pathological fracture: Secondary | ICD-10-CM

## 2013-10-31 DIAGNOSIS — K219 Gastro-esophageal reflux disease without esophagitis: Secondary | ICD-10-CM

## 2013-10-31 DIAGNOSIS — M169 Osteoarthritis of hip, unspecified: Secondary | ICD-10-CM

## 2013-10-31 DIAGNOSIS — Z860100 Personal history of colon polyps, unspecified: Secondary | ICD-10-CM

## 2013-10-31 DIAGNOSIS — F3289 Other specified depressive episodes: Secondary | ICD-10-CM

## 2013-10-31 DIAGNOSIS — M1611 Unilateral primary osteoarthritis, right hip: Secondary | ICD-10-CM

## 2013-10-31 DIAGNOSIS — N760 Acute vaginitis: Secondary | ICD-10-CM

## 2013-10-31 DIAGNOSIS — F329 Major depressive disorder, single episode, unspecified: Secondary | ICD-10-CM

## 2013-10-31 HISTORY — DX: Unilateral primary osteoarthritis, right hip: M16.11

## 2013-10-31 MED ORDER — ESTROGENS, CONJUGATED 0.625 MG/GM VA CREA: 1.0000 g | TOPICAL_CREAM | Freq: Every day | VAGINAL | Status: DC

## 2013-10-31 MED ORDER — METHOCARBAMOL 500 MG PO TABS: 500.0000 mg | ORAL_TABLET | Freq: Two times a day (BID) | ORAL | Status: DC

## 2013-10-31 MED ORDER — OMEPRAZOLE 20 MG PO CPDR: DELAYED_RELEASE_CAPSULE | ORAL | Status: DC

## 2013-10-31 MED ORDER — INDOMETHACIN 50 MG PO CAPS: 50.0000 mg | ORAL_CAPSULE | Freq: Every day | ORAL | Status: DC

## 2013-10-31 MED ORDER — TRAMADOL HCL 50 MG PO TABS: 50.0000 mg | ORAL_TABLET | Freq: Four times a day (QID) | ORAL | Status: DC | PRN

## 2013-10-31 MED ORDER — ZOLPIDEM TARTRATE 5 MG PO TABS: 2.5000 mg | ORAL_TABLET | Freq: Every evening | ORAL | Status: DC | PRN

## 2013-10-31 MED ORDER — VENLAFAXINE HCL ER 75 MG PO CP24: 75.0000 mg | ORAL_CAPSULE | Freq: Every day | ORAL | Status: DC

## 2013-10-31 NOTE — Patient Instructions (Signed)
Indomethacin 50 mg......Marland Kitchen. 1 daily  Prilosec 20 mg.......Marland Kitchen. 1 daily  Effexor 75 mg.......Marland Kitchen. 1 daily  Ambien 5 mg............ one half tab at bedtime when necessary  Robaxin and tramadol............ one half to one of each at bedtime when necessary for severe pain  Premarin vaginal cream,,,,,,,,,, small amounts as directed 2-3 times weekly  Return in one year sooner if any problems  Dr. Herold HarmsHanna Kim

## 2013-10-31 NOTE — Progress Notes (Signed)
   Subjective:    Patient ID: Tonya Pope, female    DOB: 07/02/1951, 63 y.o.   MRN: [id number]  HPI Tonya Pope is a 63 year old married female nonsmoker........ her husband is [id number] and has had numerous vascular issues this year including AAA and femoropopliteal bypasses in many strokes. 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 Should her uterus and one ovary removed for nonmalignant year it reasons years ago. Pelvic exams not indicated  She takes indomethacin 50 mg daily for osteoarthritis and Robaxin and tramadol when necessary for severe pain. She has marked decrease in mentation of range of motion of both hips  She's not walking on a daily basis because she's going on time caring for her 64 year old husband.  She takes Prilosec 20 mg for chronic reflux  Effexor 75 mg daily for history of chronic mild depression and Ambien 5 mg one half tab 2-3 times weekly for sleep dysfunction  She gets routine eye care, dental care, BSE monthly, and you mammography,: Prostate x3 because a history of polyps. Vaccinations up-to-date   Review of Systems  Constitutional: Negative.   HENT: Negative.   Eyes: Negative.   Respiratory: Negative.   Cardiovascular: Negative.   Gastrointestinal: Negative.   Genitourinary: Negative.   Musculoskeletal: Negative.   Neurological: Negative.   Psychiatric/Behavioral: Negative.        Objective:   Physical Exam  Nursing note and vitals reviewed. Constitutional: She appears well-developed and well-nourished.  HENT:  Head: Normocephalic and atraumatic.  Right Ear: External ear normal.  Left Ear: External ear normal.  Nose: Nose normal.  Mouth/Throat: Oropharynx is clear and moist.  Eyes: EOM are normal. Pupils are equal, round, and reactive to light.  Neck: Normal range of motion. Neck supple. No thyromegaly present.    Cardiovascular: Normal rate, regular rhythm, normal heart sounds and intact distal pulses.  Exam reveals no gallop and no friction rub.   No murmur heard. No carotid or artery bruits peripheral pulses 2+ and symmetrical  Pulmonary/Chest: Effort normal and breath sounds normal.  Abdominal: Soft. Bowel sounds are normal. She exhibits no distension and no mass. There is no tenderness. There is no rebound.  Genitourinary:  Bilateral breast exam normal  Musculoskeletal: Normal range of motion.  Marked decrease range of motion of both hips 30  Lymphadenopathy:    She has no cervical adenopathy.  Neurological: She is alert. She has normal reflexes. No cranial nerve deficit. She exhibits normal muscle tone. Coordination normal.  Skin: Skin is warm and dry.  Total body skin exam normal  Psychiatric: She has a normal mood and affect. Her behavior is normal. Judgment and thought content normal.          Assessment & Plan:  Healthy female  Osteoarthritis continue indomethacin 50 mg daily  Postmenopausal vaginal dryness continue hormonal cream small amounts twice weekly  Reflux esophagitis continue Prilosec  History of mild depression continue Effexor  Occasional sleep dysfunction Ambien 5 mg one half tab each bedtime when necessary   tramadol and Robaxin when necessary for severe back pain  History colon polyps followup in GI

## 2013-10-31 NOTE — Progress Notes (Signed)
Pre visit review using our clinic review tool, if applicable. No additional management support is needed unless otherwise documented below in the visit note. 

## 2013-11-25 ENCOUNTER — Telehealth: Payer: Self-pay | Admitting: Family Medicine

## 2013-11-25 DIAGNOSIS — M810 Age-related osteoporosis without current pathological fracture: Secondary | ICD-10-CM

## 2013-11-25 MED ORDER — INDOMETHACIN 50 MG PO CAPS: 50.0000 mg | ORAL_CAPSULE | Freq: Every day | ORAL | Status: DC

## 2013-11-25 NOTE — Telephone Encounter (Signed)
Pt called her rx for indomethacin (INDOCIN) 50 MG capsule [place] her mail order pharmarcy advise her to call and req a 30 day supply till she receive her mail order

## 2013-11-26 MED ORDER — INDOMETHACIN 50 MG PO CAPS: 50.0000 mg | ORAL_CAPSULE | Freq: Every day | ORAL | Status: DC

## 2013-11-26 NOTE — Addendum Note (Signed)
Addended by: Kern ReapVEREEN, Chrissie Dacquisto B on: 11/26/2013 05:24 PM   Modules accepted: Orders

## 2013-11-26 NOTE — Telephone Encounter (Signed)
Pt call 30 day supply [facility] [address]

## 2014-01-29 ENCOUNTER — Telehealth: Payer: Self-pay | Admitting: Family Medicine

## 2014-01-29 MED ORDER — ZOSTER VACCINE LIVE 19400 UNT/0.65ML ~~LOC~~ SOLR: 0.6500 mL | Freq: Once | SUBCUTANEOUS | Status: DC

## 2014-01-29 NOTE — Telephone Encounter (Signed)
Pt would like the shingles vaccines rx sent to [facility]/ [address]. Pt not sure if insurance will pay, but want to get anyway.

## 2014-01-29 NOTE — Telephone Encounter (Signed)
Rx sent 

## 2014-02-17 ENCOUNTER — Encounter: Payer: Self-pay | Admitting: Internal Medicine

## 2014-02-17 ENCOUNTER — Telehealth: Payer: Self-pay | Admitting: Family Medicine

## 2014-02-17 ENCOUNTER — Ambulatory Visit (INDEPENDENT_AMBULATORY_CARE_PROVIDER_SITE_OTHER): Payer: 59 | Admitting: Internal Medicine

## 2014-02-17 VITALS — BP 142/70 | Temp 98.3°F | Ht 63.25 in | Wt 127.0 lb

## 2014-02-17 DIAGNOSIS — F410 Panic disorder [episodic paroxysmal anxiety] without agoraphobia: Secondary | ICD-10-CM

## 2014-02-17 DIAGNOSIS — F3289 Other specified depressive episodes: Secondary | ICD-10-CM

## 2014-02-17 DIAGNOSIS — F329 Major depressive disorder, single episode, unspecified: Secondary | ICD-10-CM

## 2014-02-17 DIAGNOSIS — G4700 Insomnia, unspecified: Secondary | ICD-10-CM

## 2014-02-17 HISTORY — DX: Panic disorder (episodic paroxysmal anxiety): F41.0

## 2014-02-17 MED ORDER — ALPRAZOLAM 0.25 MG PO TABS: 0.2500 mg | ORAL_TABLET | Freq: Two times a day (BID) | ORAL | Status: DC | PRN

## 2014-02-17 NOTE — Patient Instructions (Signed)
Please contact our office if your symptoms do not improve or gets worse. Start counseling sessions with hospice Follow up with Dr. Tawanna Coolerodd within 6 weeks

## 2014-02-17 NOTE — Progress Notes (Signed)
Pre visit review using our clinic review tool, if applicable. No additional management support is needed unless otherwise documented below in the visit note. 

## 2014-02-17 NOTE — Telephone Encounter (Signed)
Patient Information:  Caller Name: [name]  Phone: [phone]  Patient: Tonya FantasiaHouglan, Tonya B  Gender: Female  DOB: 02/17/1951  Age: 63 Years  PCP: Kelle Dartingodd, Jeffrey [facility](Family Practice)  Office Follow Up:  Does the office need to follow up with this patient?: No  Instructions For The Office: N/A  RN Note:  Report called to [place] who offered patient a 2:30pm appointment with Dr. Artist PaisYoo. Patient agreeable.  Symptoms  Reason For Call & Symptoms: Patient reports husband died 6 weeks ago and patient has recently been experiencing extreme nervousness, panic attacks, and depression. Patient reports she feels she needs evaluation due to having history of symptoms likes these in the past "turning into something bigger." Denies thoughts of harming self or others. Denies feelings of hopelessness or "nothing worth living for". Admits symptoms are preventing her from keeping to her routine and  caring for herself like she should.  Reviewed Health History In EMR: Yes  Reviewed Medications In EMR: Yes  Reviewed Allergies In EMR: Yes  Reviewed Surgeries / Procedures: Yes  Date of Onset of Symptoms: 02/14/2014  Guideline(s) Used:  Suicide Concerns  Depression  Disposition Per Guideline:   Call Local Agency Today  Reason For Disposition Reached:   Symptoms interfere with work or school  Advice Given:  Call Back If:  You become worse.  Patient Will Follow Care Advice:  YES

## 2014-02-17 NOTE — Assessment & Plan Note (Signed)
Unchanged. Continue zolpidem.

## 2014-02-17 NOTE — Progress Notes (Signed)
Subjective:    Patient ID: Tonya Pope, female    DOB: 1951/05/31, 63 y.o.   MRN: [id number]  HPI  63 year old white female with history of chronic insomnia and depression presents with acute panic attacks. Since previous visit, her husband with advanced age and multiple medical issues passed away. He was in hospice. Patient reports they were married for 32 years. Her symptoms got worse 3 or 4 days ago when she got multiple phone calls regarding questions about his death. Patient also has been struggling to get her finances in order.  Her panic attack symptoms include shortness of breath, crying, heart racing in the generalized sensation of being "closed in".  History of depression-patient has been on Effexor for at least 8 years. She tried Sertraline but experienced side effects.  She has been unsuccessful in her previous attempts to taper off Effexor.  Chronic insomnia - no significant change.  Review of Systems No significant change in weight or appetite   Past Medical History  Diagnosis Date  . GERD (gastroesophageal reflux disease)   . OP (osteoporosis)   . Migraine   . Acne   . Insomnia   . Depression   . Colon polyps     History   Social History  . Marital Status: Married    Spouse Name: N/A    Number of Children: N/A  . Years of Education: N/A   Occupational History  . Not on file.   Social History Main Topics  . Smoking status: Former Games developer  . Smokeless tobacco: Not on file  . Alcohol Use:   . Drug Use:   . Sexual Activity:    Other Topics Concern  . Not on file   Social History Narrative  . No narrative on file    Past Surgical History  Procedure Laterality Date  . Abdominal hysterectomy    . Colonoscopy w/ polypectomy    . Tonsillectomy and adenoidectomy      Family History  Problem Relation Age of Onset  . Stroke Mother   . Arthritis Mother   . Diabetes Mother   . Heart disease Mother   . Hypertension Mother   . Hyperlipidemia  Father   . Glaucoma Father   . Thyroid disease Sister   . Cancer Other     breast,colon    Allergies  Allergen Reactions  . Codeine Phosphate     REACTION: nausea  . Penicillins     REACTION: rash  . Promethazine Hcl     REACTION: CNS S.E.    Current Outpatient Prescriptions on File Prior to Visit  Medication Sig Dispense Refill  . Calcium-Magnesium-Vitamin D (ONE-A-DAY CALCIUM PLUS PO) Take 1 tablet by mouth daily.       Marland Kitchen conjugated estrogens (PREMARIN) vaginal cream Place 0.5 Applicatorfuls vaginally daily.  42.5 g  11  . indomethacin (INDOCIN) 50 MG capsule Take 1 capsule (50 mg total) by mouth daily.  30 capsule  0  . methocarbamol (ROBAXIN) 500 MG tablet Take 1 tablet (500 mg total) by mouth 2 (two) times daily.  40 tablet  1  . omeprazole (PRILOSEC) 20 MG capsule Take 1 capsule by mouth  daily  100 capsule  3  . traMADol (ULTRAM) 50 MG tablet Take 1 tablet (50 mg total) by mouth every 6 (six) hours as needed.  15 tablet  1  . venlafaxine XR (EFFEXOR-XR) 75 MG 24 hr capsule Take 1 capsule (75 mg total) by mouth daily.  100 capsule  3  . zolpidem (AMBIEN) 5 MG tablet Take 0.5 tablets (2.5 mg total) by mouth at bedtime as needed. For sleep  90 tablet  1   No current facility-administered medications on file prior to visit.    BP 142/70  Temp(Src) 98.3 F (36.8 C) (Oral)  Ht 5' 3.25" (1.607 m)  Wt 127 lb (57.607 kg)  BMI 22.31 kg/m2       Objective:   Physical Exam  Constitutional: She is oriented to person, place, and time. She appears well-developed and well-nourished. No distress.  HENT:  Head: Normocephalic and atraumatic.  Neck: Neck supple. No thyromegaly present.  Cardiovascular: Normal rate, regular rhythm and normal heart sounds.   No murmur heard. Pulmonary/Chest: Effort normal and breath sounds normal. She has no wheezes.  Neurological: She is oriented to person, place, and time. No cranial nerve deficit.  Skin: Skin is warm and dry.  Psychiatric:    Tearful but good eye contact          Assessment & Plan:

## 2014-02-17 NOTE — Assessment & Plan Note (Signed)
Continue Effexor XR 75 mg once daily. If panic attacks do not improve, we discussed potentially transitioning from Effexor to either citalopram or Lexapro.  She will further discuss with her PCP.

## 2014-02-17 NOTE — Assessment & Plan Note (Signed)
Patient counseling provided (time spent approximately 30 minutes). Use alprazolam 0.25 mg twice daily as needed. Patient also strongly encouraged to take advantage of hospice counseling services.

## 2014-02-27 LAB — HM MAMMOGRAPHY: HM Mammogram: NORMAL

## 2014-03-26 ENCOUNTER — Other Ambulatory Visit: Payer: Self-pay | Admitting: *Deleted

## 2014-03-26 DIAGNOSIS — F3289 Other specified depressive episodes: Secondary | ICD-10-CM

## 2014-03-26 DIAGNOSIS — F329 Major depressive disorder, single episode, unspecified: Secondary | ICD-10-CM

## 2014-03-26 MED ORDER — ZOLPIDEM TARTRATE 5 MG PO TABS: 2.5000 mg | ORAL_TABLET | Freq: Every evening | ORAL | Status: DC | PRN

## 2014-03-26 NOTE — Telephone Encounter (Signed)
Rx faxed

## 2014-03-27 ENCOUNTER — Ambulatory Visit (INDEPENDENT_AMBULATORY_CARE_PROVIDER_SITE_OTHER): Payer: 59 | Admitting: Family Medicine

## 2014-03-27 ENCOUNTER — Encounter: Payer: Self-pay | Admitting: Family Medicine

## 2014-03-27 VITALS — BP 130/70 | Temp 98.2°F | Wt 126.0 lb

## 2014-03-27 DIAGNOSIS — F410 Panic disorder [episodic paroxysmal anxiety] without agoraphobia: Secondary | ICD-10-CM

## 2014-03-27 DIAGNOSIS — R1013 Epigastric pain: Secondary | ICD-10-CM

## 2014-03-27 DIAGNOSIS — F329 Major depressive disorder, single episode, unspecified: Secondary | ICD-10-CM

## 2014-03-27 DIAGNOSIS — F3289 Other specified depressive episodes: Secondary | ICD-10-CM

## 2014-03-27 DIAGNOSIS — G4700 Insomnia, unspecified: Secondary | ICD-10-CM

## 2014-03-27 HISTORY — DX: Epigastric pain: R10.13

## 2014-03-27 MED ORDER — VENLAFAXINE HCL ER 150 MG PO CP24: 150.0000 mg | ORAL_CAPSULE | Freq: Every day | ORAL | Status: DC

## 2014-03-27 NOTE — Progress Notes (Signed)
   Subjective:    Patient ID: Tonya Pope, female    DOB: 08/17/1950, 63 y.o.   MRN: [id number]  HPI [name] is a 63 year old recently widowed female....... her husband died in 01-25-2023 and chronic renal [place] congestive heart failure....... who comes in today for followup  She states she feels very depressed because her husband's situation. She's: Hospice counseling. She's taking Ambien 2.5 mg at bedtime along with Effexor long-acting 75 mg daily. She also states the wintertime as bad for her.  For the past 3 days she's had a midepigastric burning pain. She takes indomethacin 50 mg daily for osteoarthritis. She has no fever chills nausea vomiting diarrhea change in bowel habits or urine. She's had midepigastric burning in the past from indomethacin   Review of Systems    review of systems otherwise negative Objective:   Physical Exam  Well-developed well-nourished female no acute distress vital signs stable she is afebrile  She is oriented x3 tearful appropriate      Assessment & Plan:  Depression secondary to the death of her husband,,,,,,,,, increase Effexor to 150 mg daily,,,, continue hospice counseling,,,,,,,, followup in 6 weeks  Midepigastric burning,,,,,,,,,,,, discontinue the indomethacin,,,,,,,,, double the Prilosec for a week to 10 days and to use symptoms abate also add Maalox or Riopan +2 tablespoons twice daily,

## 2014-03-27 NOTE — Patient Instructions (Signed)
Stop the indomethacin  Prilosec one twice daily until your symptoms resolve  Rynatan plus,,,,,,,,,, 2 tablespoons twice daily until your symptoms resolve  Increase the  Effexor 150 mg daily,

## 2014-03-27 NOTE — Progress Notes (Signed)
Pre visit review using our clinic review tool, if applicable. No additional management support is needed unless otherwise documented below in the visit note. 

## 2014-05-08 ENCOUNTER — Encounter: Payer: Self-pay | Admitting: Family Medicine

## 2014-05-08 ENCOUNTER — Ambulatory Visit (INDEPENDENT_AMBULATORY_CARE_PROVIDER_SITE_OTHER): Payer: 59 | Admitting: Family Medicine

## 2014-05-08 VITALS — BP 120/80 | Temp 98.5°F | Wt 127.0 lb

## 2014-05-08 DIAGNOSIS — G4700 Insomnia, unspecified: Secondary | ICD-10-CM

## 2014-05-08 DIAGNOSIS — F32A Depression, unspecified: Secondary | ICD-10-CM

## 2014-05-08 DIAGNOSIS — F329 Major depressive disorder, single episode, unspecified: Secondary | ICD-10-CM

## 2014-05-08 MED ORDER — VENLAFAXINE HCL ER 37.5 MG PO CP24: 37.5000 mg | ORAL_CAPSULE | Freq: Every day | ORAL | Status: DC

## 2014-05-08 MED ORDER — LORAZEPAM 1 MG PO TABS: 1.0000 mg | ORAL_TABLET | Freq: Two times a day (BID) | ORAL | Status: DC | PRN

## 2014-05-08 NOTE — Patient Instructions (Signed)
Effexor 150 mg.......... +37.5 mg............Marland Kitchen. every morning  Do not take the Ambien nor the Xanax  Ativan 1 mg........... one tablet at bedtime  Walk 30 minutes daily  If in 4-6 weeks we don't see any improvement then call and make an appointment to see Tonya Pope

## 2014-05-08 NOTE — Progress Notes (Signed)
   Subjective:    Patient ID: Audley HosePamella B Barbara, female    DOB: 04/24/1951, 63 y.o.   MRN: [id number]  HPI  [name] is a 63 year old female who comes in better. She still having trouble with sleep. She wakes up at 3:00 in the morning and is up to 7 AM.  She came in one time this past summer my absence and was given a prescription for Xanax. Asked her not to use that. She's also increased her Ambien to 5 mg daily but still can't sleep.  We talked about all the options including trying to adjust her medication and/or a consult with Emerson MonteParrish McKinney.today for followup of depression  We started on Effexor 150 mg long-acting daily and she   She would first like to increase her medication review of systems otherwise negative well-developed well-nourished female no acute distress vital signs stable she is afebrile depression..............states review of systems negative well-developed well-nourished female no acute distress vital signs stable she is afebrile review of systems negative she feels about 50%   Review of Systems    review of systems negative Objective:   Physical Exam  Vital signs stable she is afebrile      Assessment & Plan:  . Depression.......... increase Effexor since she is about 50% better  Sleep dysfunction.......... stop the Ambien......... add Ativan.......... consult with Emerson MonteParrish McKinney when necessary.

## 2014-05-08 NOTE — Progress Notes (Signed)
Pre visit review using our clinic review tool, if applicable. No additional management support is needed unless otherwise documented below in the visit note. 

## 2014-05-20 ENCOUNTER — Telehealth: Payer: Self-pay | Admitting: Family Medicine

## 2014-05-20 DIAGNOSIS — F32A Depression, unspecified: Secondary | ICD-10-CM

## 2014-05-20 DIAGNOSIS — F329 Major depressive disorder, single episode, unspecified: Secondary | ICD-10-CM

## 2014-05-20 DIAGNOSIS — G479 Sleep disorder, unspecified: Secondary | ICD-10-CM

## 2014-05-20 DIAGNOSIS — F410 Panic disorder [episodic paroxysmal anxiety] without agoraphobia: Secondary | ICD-10-CM

## 2014-05-20 NOTE — Telephone Encounter (Signed)
Pt given LORazepam (ATIVAN) 1 MG tablet for sleep and its not working . Would like another rx. [facility] [address]  Pt also states dr todd was to give referral for counseling. In our group

## 2014-05-20 NOTE — Telephone Encounter (Signed)
Per Dr Tawanna Coolerodd if the Ativan is not helping then she should see Dr Nolen MuMcKinney.  Patient is aware and referral placed

## 2014-11-11 ENCOUNTER — Other Ambulatory Visit: Payer: Self-pay | Admitting: Family Medicine

## 2014-11-28 ENCOUNTER — Encounter: Payer: Self-pay | Admitting: Family Medicine

## 2015-02-18 ENCOUNTER — Telehealth: Payer: Self-pay | Admitting: Adult Health

## 2015-02-18 ENCOUNTER — Ambulatory Visit (INDEPENDENT_AMBULATORY_CARE_PROVIDER_SITE_OTHER): Payer: 59 | Admitting: Adult Health

## 2015-02-18 ENCOUNTER — Ambulatory Visit (INDEPENDENT_AMBULATORY_CARE_PROVIDER_SITE_OTHER)
Admission: RE | Admit: 2015-02-18 | Discharge: 2015-02-18 | Disposition: A | Discharge status: Home / Self Care | Payer: 59 | Source: Ambulatory Visit | Attending: Adult Health | Admitting: Adult Health

## 2015-02-18 ENCOUNTER — Encounter: Payer: Self-pay | Admitting: Adult Health

## 2015-02-18 VITALS — BP 124/68 | Temp 98.7°F | Ht 63.25 in | Wt 128.7 lb

## 2015-02-18 DIAGNOSIS — K2100 Gastro-esophageal reflux disease with esophagitis, without bleeding: Secondary | ICD-10-CM

## 2015-02-18 DIAGNOSIS — M5442 Lumbago with sciatica, left side: Secondary | ICD-10-CM

## 2015-02-18 MED ORDER — PREDNISONE 20 MG PO TABS: 20.0000 mg | ORAL_TABLET | Freq: Every day | ORAL | Status: DC

## 2015-02-18 MED ORDER — CYCLOBENZAPRINE HCL 10 MG PO TABS: 10.0000 mg | ORAL_TABLET | Freq: Three times a day (TID) | ORAL | Status: DC | PRN

## 2015-02-18 MED ORDER — PANTOPRAZOLE SODIUM 40 MG PO TBEC: 40.0000 mg | DELAYED_RELEASE_TABLET | Freq: Every day | ORAL | Status: DC

## 2015-02-18 NOTE — Telephone Encounter (Signed)
Spoke to patient on the phone and updated her of her xray read. She will try medication first to help with the pain. If no improvement radiologist recommends MRI

## 2015-02-18 NOTE — Progress Notes (Addendum)
Subjective:    Patient ID: Tonya Pope, female    DOB: 11-Mar-1951, 64 y.o.   MRN: [id number]  HPI  64 year old female who presents to the office with multiple complaints.   1. Bilateral lower back pain that feels like " a stack of bricks is laying on it." She has had this pain for three weeks it radiates to the back of her leg She also endorses that when she walks her left foot goes "numb". Denies any tingling. Denies any UTI type symptoms. Has history of Spondylosis. Has not tried any OTC medications. Denies any edema in lower extremities or burning in lower extremities.  No recent long trips.   2. Diarrhea for two or three weeks, has been intermittent. Usually after she eats. Denies any abdominal pain,  blood in stool, fever, nausea or vomiting. Has since resolved for a week.   3. Continues to have GERD like symptoms. Has been on Prilosec " for ten years" and would like to try something else.   Review of Systems  Constitutional: Negative.   Respiratory: Negative.   Cardiovascular: Negative.   Gastrointestinal: Positive for diarrhea.  Genitourinary: Negative.   Musculoskeletal: Positive for back pain. Negative for myalgias, joint swelling, arthralgias, gait problem, neck pain and neck stiffness.  Neurological: Positive for numbness. Negative for dizziness, weakness and light-headedness.  All other systems reviewed and are negative.  Past Medical History  Diagnosis Date  . GERD (gastroesophageal reflux disease)   . OP (osteoporosis)   . Migraine   . Acne   . Insomnia   . Depression   . Colon polyps     History   Social History  . Marital Status: Married    Spouse Name: N/A  . Number of Children: N/A  . Years of Education: N/A   Occupational History  . Not on file.   Social History Main Topics  . Smoking status: Former Games developer  . Smokeless tobacco: Not on file  . Alcohol Use: Not on file  . Drug Use: Not on file  . Sexual Activity: Not on file   Other Topics  Concern  . Not on file   Social History Narrative    Past Surgical History  Procedure Laterality Date  . Abdominal hysterectomy    . Colonoscopy w/ polypectomy    . Tonsillectomy and adenoidectomy      Family History  Problem Relation Age of Onset  . Stroke Mother   . Arthritis Mother   . Diabetes Mother   . Heart disease Mother   . Hypertension Mother   . Hyperlipidemia Father   . Glaucoma Father   . Thyroid disease Sister   . Cancer Other     breast,colon    Allergies  Allergen Reactions  . Codeine Phosphate     REACTION: nausea  . Penicillins     REACTION: rash  . Promethazine Hcl     REACTION: CNS S.E.    Current Outpatient Prescriptions on File Prior to Visit  Medication Sig Dispense Refill  . conjugated estrogens (PREMARIN) vaginal cream Place 0.5 Applicatorfuls vaginally daily. 42.5 g 11  . indomethacin (INDOCIN) 50 MG capsule TAKE 1 CAPSULE BY MOUTH EVERY DAY. 90 capsule 0  . omeprazole (PRILOSEC) 20 MG capsule TAKE 1 CAPSULE BY MOUTH DAILY 130 capsule 0  . venlafaxine XR (EFFEXOR XR) 150 MG 24 hr capsule Take 1 capsule (150 mg total) by mouth daily with breakfast. 100 capsule 3  . venlafaxine XR (EFFEXOR XR)  37.5 MG 24 hr capsule Take 1 capsule (37.5 mg total) by mouth daily with breakfast. 100 capsule 3   No current facility-administered medications on file prior to visit.    BP 124/68 mmHg  Temp(Src) 98.7 F (37.1 C) (Oral)  Ht 5' 3.25" (1.607 m)  Wt 128 lb 11.2 oz (58.378 kg)  BMI 22.61 kg/m2       Objective:   Physical Exam  Constitutional: She is oriented to person, place, and time. She appears well-developed and well-nourished. No distress.  Cardiovascular: Normal rate, regular rhythm, normal heart sounds and intact distal pulses.  Exam reveals no gallop and no friction rub.   No murmur heard. Pulmonary/Chest: Effort normal and breath sounds normal. No respiratory distress. She has no wheezes. She has no rales. She exhibits no tenderness.    Abdominal: Soft. Bowel sounds are normal. She exhibits no distension and no mass. There is no tenderness. There is no rebound and no guarding.  Musculoskeletal: Normal range of motion. She exhibits no edema or tenderness.  No increased pain with straight leg raise or bending of knee to chest.   Neurological: She is alert and oriented to person, place, and time. She has normal reflexes.  Skin: Skin is warm and dry. No rash noted. She is not diaphoretic. No erythema. No pallor.  Psychiatric: She has a normal mood and affect. Her behavior is normal. Judgment and thought content normal.  Nursing note and vitals reviewed.     Assessment & Plan:  1. Bilateral low back pain with left-sided sciatica - cyclobenzaprine (FLEXERIL) 10 MG tablet; Take 1 tablet (10 mg total) by mouth 3 (three) times daily as needed for muscle spasms.  Dispense: 30 tablet; Refill: 0 - predniSONE (DELTASONE) 20 MG tablet; Take 1 tablet (20 mg total) by mouth daily with breakfast.  Dispense: 19 tablet; Refill: 0 60mg  x 3 days, 40mg  x 3 days, 20 mg x 3 days.  - DG Lumbar Spine Complete; Future - Will follow up with imaging results.   2. Gastroesophageal reflux disease with esophagitis - Discontinue Prilosec.  - pantoprazole (PROTONIX) 40 MG tablet; Take 1 tablet (40 mg total) by mouth daily.  Dispense: 90 tablet; Refill: 3

## 2015-02-18 NOTE — Patient Instructions (Signed)
I have sent in a prescription for Flexeril, this is the muscle relaxer. Please take it at night as it can make you feel sleepy.   Take the prednisone as directed: Day 1 60 mg Day 2 60 mg Day 3 60 mg Day 4 40 mg Day 5 40 mg Day 6 40 mg Day 7 20 mg Day 8 20 mg Day 9   I will follow up with you regarding labs.   Please follow up if no improvement after completing prednisone.

## 2015-02-28 ENCOUNTER — Other Ambulatory Visit: Payer: Self-pay | Admitting: Family Medicine

## 2015-03-17 ENCOUNTER — Other Ambulatory Visit: Payer: Self-pay | Admitting: Adult Health

## 2015-03-17 ENCOUNTER — Telehealth: Payer: Self-pay | Admitting: Family Medicine

## 2015-03-17 ENCOUNTER — Other Ambulatory Visit: Payer: Self-pay | Admitting: Family Medicine

## 2015-03-17 DIAGNOSIS — M5442 Lumbago with sciatica, left side: Secondary | ICD-10-CM

## 2015-03-17 DIAGNOSIS — R197 Diarrhea, unspecified: Secondary | ICD-10-CM

## 2015-03-17 MED ORDER — DICYCLOMINE HCL 10 MG PO CAPS
10.0000 mg | ORAL_CAPSULE | Freq: Three times a day (TID) | ORAL | Status: DC
Start: 2015-03-17 — End: 2015-05-08

## 2015-03-17 NOTE — Telephone Encounter (Signed)
Called and spoke with pt and pt is aware of Cory's recommendations.  Pt would like to have the MRI and the stool samples.  Expressed the importance of not starting the Bentyl until the stool samples are returned for testing.  Pt verbalized understanding.  Pt will come pick up the stool samples on 8.31.2016.

## 2015-03-17 NOTE — Telephone Encounter (Signed)
Pt states her back pain continues , numbness in foot and has gone to the leg. Pt states an MRI was suggested and she would like to schedule an MRI asap.  In addition, pt states she is still having diarrhea.  Pt had colonoscopy 2011 , and would like to schedule that as well. And if you have any other recommendations for this diarrhea

## 2015-03-17 NOTE — Telephone Encounter (Signed)
We can go ahead and put the MRI order in.   As for the diarrhea, before we jump to a colonoscopy, let us get some stool samples to make sure there is no infectious bacteria in it.   I will send in some Bentyl for the diarrhea, but would prefer a stool sample prior to taking. She can stop by and pick up a cup and then return it if needed.

## 2015-03-18 ENCOUNTER — Other Ambulatory Visit: Payer: Self-pay | Admitting: Adult Health

## 2015-03-18 ENCOUNTER — Other Ambulatory Visit: Payer: 59

## 2015-03-18 DIAGNOSIS — R197 Diarrhea, unspecified: Secondary | ICD-10-CM

## 2015-03-20 ENCOUNTER — Telehealth: Payer: Self-pay | Admitting: *Deleted

## 2015-03-20 LAB — CLOSTRIDIUM DIFFICILE BY PCR: Toxigenic C. Difficile by PCR: DETECTED — CR

## 2015-03-20 MED ORDER — METRONIDAZOLE 500 MG PO TABS: 500.0000 mg | ORAL_TABLET | Freq: Three times a day (TID) | ORAL | Status: DC

## 2015-03-20 NOTE — Telephone Encounter (Signed)
CRITICAL VALUE STICKER  CRITICAL VALUE: + C. DIFF  RECEIVER (INITALS): MJ  DATE & TIME NOTIFIED: 2:35PM 03/20/2015  MD NOTIFIED: MD FRY  TIME OF NOTIFICATION: 2:50 PM  RESPONSE: ORDER 500 MG FLAGYL TID 30 TABLETS IF ASYMPTOMATIC AND F/U Tuesday TO MAKE APPOINTMENT TO SEE PCP.

## 2015-03-20 NOTE — Telephone Encounter (Signed)
Called and spoke to patient. Patient states she has a friend whose mother is in the nursing home and she may have contracted C.Diff from there. Patient states overall feels good. Educated patient to drink plenty of fluids and Gatorade to maintain electrolytes. Patient plans to pick up prescription this afternoon and call Tuesday to schedule follow-up appointment. Rx sent in.

## 2015-03-20 NOTE — Addendum Note (Signed)
Addended by: Shade Flood on: 03/20/2015 03:45 PM   Modules accepted: Orders

## 2015-03-23 LAB — STOOL CULTURE

## 2015-03-26 ENCOUNTER — Other Ambulatory Visit: Payer: Self-pay | Admitting: Adult Health

## 2015-03-27 ENCOUNTER — Ambulatory Visit: Payer: Self-pay | Admitting: Internal Medicine

## 2015-03-27 ENCOUNTER — Telehealth: Payer: Self-pay | Admitting: Family Medicine

## 2015-03-27 NOTE — Telephone Encounter (Signed)
Patient Name: Tonya Pope  DOB: 11-07-1950    Initial Comment Caller states she is having questions about her medications. She is having dark and reddish urine.   Nurse Assessment  Nurse: Sherilyn Cooter, RN, Thurmond Butts Date/Time Lamount Cohen Time): 03/27/2015 1:17:47 PM  Confirm and document reason for call. If symptomatic, describe symptoms. ---Caller states that she frequently has microscopic blood in her urine. She was seen and prescribed Flagyl. Her urine is now dark reddish. She began taking Flagyl about a week ago. She has had 2 outputs that were dark red in color. She denies pain and burning with urination. Denies fever.  Has the patient traveled out of the country within the last 30 days? ---No  Does the patient require triage? ---Yes  Related visit to physician within the last 2 weeks? ---No  Does the PT have any chronic conditions? (i.e. diabetes, asthma, etc.) ---No     Guidelines    Guideline Title Affirmed Question Affirmed Notes  Urine - Blood In Blood in urine, but all triage questions negative (Exception: could be normal menstrual bleeding)    Final Disposition User   See Physician within 24 Hours Sherilyn Cooter, RN, Thurmond Butts    Comments  Appointment made for 3:00pm today with Dr. Alfredo Batty   Disagree/Comply: Comply

## 2015-03-27 NOTE — Telephone Encounter (Signed)
Dr. Artist Pais is not in office today. Spoke with Dr. Clent Ridges and he agreed Flagyl can cause urine to turn an orange/dark color. Patient denied any symptoms. However, since patient was prescribed Flagyl due to C.Diff we wanted to ensure patient maintained her hydration. Patient educated to drink lots of fluids and to call office if symptoms arise. Patient verbalized understanding.

## 2015-03-31 ENCOUNTER — Other Ambulatory Visit: Payer: 59

## 2015-04-02 ENCOUNTER — Other Ambulatory Visit: Payer: Self-pay | Admitting: Adult Health

## 2015-04-02 MED ORDER — METRONIDAZOLE 500 MG PO TABS: 500.0000 mg | ORAL_TABLET | Freq: Three times a day (TID) | ORAL | Status: DC

## 2015-04-02 NOTE — Telephone Encounter (Signed)
Left VM informing patient to call back since she is still having symptoms. In the meantime, called in 4 additional days of Flagyl  TID.

## 2015-04-02 NOTE — Telephone Encounter (Signed)
Pt has finished the 10 days of ABX, and problem still persisit. Pt states it may be a little better, but she is not improving much. Would like to know what to do next.

## 2015-04-03 ENCOUNTER — Other Ambulatory Visit: Payer: Self-pay | Admitting: Adult Health

## 2015-04-03 ENCOUNTER — Other Ambulatory Visit: Payer: Self-pay

## 2015-04-03 ENCOUNTER — Telehealth: Payer: Self-pay | Admitting: Adult Health

## 2015-04-03 MED ORDER — DIPHENOXYLATE-ATROPINE 2.5-0.025 MG PO TABS: 1.0000 | ORAL_TABLET | Freq: Four times a day (QID) | ORAL | Status: DC | PRN

## 2015-04-03 NOTE — Telephone Encounter (Signed)
Spoke to patient on the phone. She had picked up the additional 4 days of Flagyl last night and has started taking them. She does endorses that before she was done with the original 10 day course that the diarrhea was getting better but then it had started again after she finished.   She continues to have diarrhea    Lomotil sent in.   She is going to follow up with me on Monday after she finishes this round of abx

## 2015-04-07 ENCOUNTER — Other Ambulatory Visit: Payer: Self-pay | Admitting: Family Medicine

## 2015-04-09 ENCOUNTER — Telehealth: Payer: Self-pay | Admitting: Family Medicine

## 2015-04-09 ENCOUNTER — Encounter: Payer: Self-pay | Admitting: Internal Medicine

## 2015-04-09 ENCOUNTER — Ambulatory Visit (INDEPENDENT_AMBULATORY_CARE_PROVIDER_SITE_OTHER): Payer: 59 | Admitting: Internal Medicine

## 2015-04-09 VITALS — BP 110/72 | HR 83 | Temp 98.4°F | Resp 18 | Ht 63.25 in | Wt 126.0 lb

## 2015-04-09 DIAGNOSIS — K219 Gastro-esophageal reflux disease without esophagitis: Secondary | ICD-10-CM | POA: Diagnosis not present

## 2015-04-09 DIAGNOSIS — R197 Diarrhea, unspecified: Secondary | ICD-10-CM | POA: Diagnosis not present

## 2015-04-09 DIAGNOSIS — Z86010 Personal history of colonic polyps: Secondary | ICD-10-CM

## 2015-04-09 LAB — CBC WITH DIFFERENTIAL/PLATELET
Basophils Absolute: 0 10*3/uL (ref 0.0–0.1)
Basophils Relative: 0.5 % (ref 0.0–3.0)
Eosinophils Absolute: 0.1 10*3/uL (ref 0.0–0.7)
Eosinophils Relative: 1.4 % (ref 0.0–5.0)
HCT: 36.4 % (ref 36.0–46.0)
Hemoglobin: 12.3 g/dL (ref 12.0–15.0)
LYMPHS ABS: 1.8 10*3/uL (ref 0.7–4.0)
Lymphocytes Relative: 34.9 % (ref 12.0–46.0)
MCHC: 33.7 g/dL (ref 30.0–36.0)
MCV: 95.9 fl (ref 78.0–100.0)
MONOS PCT: 10 % (ref 3.0–12.0)
Monocytes Absolute: 0.5 10*3/uL (ref 0.1–1.0)
NEUTROS ABS: 2.8 10*3/uL (ref 1.4–7.7)
Neutrophils Relative %: 53.2 % (ref 43.0–77.0)
PLATELETS: 267 10*3/uL (ref 150.0–400.0)
RBC: 3.8 Mil/uL — ABNORMAL LOW (ref 3.87–5.11)
RDW: 13.9 % (ref 11.5–15.5)
WBC: 5.2 10*3/uL (ref 4.0–10.5)

## 2015-04-09 LAB — COMPREHENSIVE METABOLIC PANEL
ALT: 13 U/L (ref 0–35)
AST: 15 U/L (ref 0–37)
Albumin: 4.1 g/dL (ref 3.5–5.2)
Alkaline Phosphatase: 53 U/L (ref 39–117)
BUN: 13 mg/dL (ref 6–23)
CO2: 29 meq/L (ref 19–32)
CREATININE: 0.78 mg/dL (ref 0.40–1.20)
Calcium: 9.3 mg/dL (ref 8.4–10.5)
Chloride: 104 mEq/L (ref 96–112)
GFR: 78.81 mL/min (ref >60.00)
GLUCOSE: 85 mg/dL (ref 70–99)
Potassium: 4 mEq/L (ref 3.5–5.1)
Sodium: 141 mEq/L (ref 135–145)
Total Bilirubin: 0.3 mg/dL (ref 0.2–1.2)
Total Protein: 6.3 g/dL (ref 6.0–8.3)

## 2015-04-09 LAB — TSH: TSH: 0.77 u[IU]/mL (ref 0.35–4.50)

## 2015-04-09 MED ORDER — DIPHENOXYLATE-ATROPINE 2.5-0.025 MG PO TABS: 1.0000 | ORAL_TABLET | Freq: Four times a day (QID) | ORAL | Status: DC | PRN

## 2015-04-09 NOTE — Progress Notes (Signed)
Subjective:    Patient ID: Tonya Pope, female    DOB: September 25, 1950, 64 y.o.   MRN: [id number]  HPI Wt Readings from Last 3 Encounters:  04/09/15 126 lb (57.153 kg)  02/18/15 128 lb 11.2 oz (58.378 kg)  05/08/14 127 lb (57.56 kg)   64 year old patient who is seen today with a chief complaint of persistent diarrhea.  She was seen on August 3 with the 2-3 week history of diarrhea.  At that time.  After she failed to improve.  Stool for enteric pathogens as well as C. difficile toxin was obtained on August 31.  C. difficile toxin was detected and the patient has been treated with 14 days of metronidazole the completed 2 days ago.  She continues to have loose watery diarrhea with intermittent frequency.  1-2 times daily to as many as 3-5 times daily.  No nocturnal diarrhea, fever, abdominal pain or blood in the stool.  There has been no weight loss or vomiting.  She does describe some modest decrease in appetite.  patient was treated with an antibiotic.  Briefly in April following a root canal  She states that a number of years ago.  She had some brief diarrhea that resolved on a lactose-free diet  She has had a colonoscopy in 2008 and a upper endoscopy in 2011  Past Medical History  Diagnosis Date  . GERD (gastroesophageal reflux disease)   . OP (osteoporosis)   . Migraine   . Acne   . Insomnia   . Depression   . Colon polyps     Social History   Social History  . Marital Status: Married    Spouse Name: N/A  . Number of Children: N/A  . Years of Education: N/A   Occupational History  . Not on file.   Social History Main Topics  . Smoking status: Former Games developer  . Smokeless tobacco: Not on file  . Alcohol Use: Not on file  . Drug Use: Not on file  . Sexual Activity: Not on file   Other Topics Concern  . Not on file   Social History Narrative    Past Surgical History  Procedure Laterality Date  . Abdominal hysterectomy    . Colonoscopy w/ polypectomy    .  Tonsillectomy and adenoidectomy      Family History  Problem Relation Age of Onset  . Stroke Mother   . Arthritis Mother   . Diabetes Mother   . Heart disease Mother   . Hypertension Mother   . Hyperlipidemia Father   . Glaucoma Father   . Thyroid disease Sister   . Cancer Other     breast,colon    Allergies  Allergen Reactions  . Codeine Phosphate     REACTION: nausea  . Penicillins     REACTION: rash  . Promethazine Hcl     REACTION: CNS S.E.    Current Outpatient Prescriptions on File Prior to Visit  Medication Sig Dispense Refill  . conjugated estrogens (PREMARIN) vaginal cream Place 0.5 Applicatorfuls vaginally daily. 42.5 g 11  . cyclobenzaprine (FLEXERIL) 10 MG tablet Take 1 tablet (10 mg total) by mouth 3 (three) times daily as needed for muscle spasms. 30 tablet 0  . dicyclomine (BENTYL) 10 MG capsule Take 1 capsule (10 mg total) by mouth 4 (four) times daily -  before meals and at bedtime. 30 capsule 0  . indomethacin (INDOCIN) 50 MG capsule TAKE ONE CAPSULE BY MOUTH DAILY 90 capsule 0  .  omeprazole (PRILOSEC) 20 MG capsule TAKE 1 CAPSULE BY MOUTH DAILY 130 capsule 0  . pantoprazole (PROTONIX) 40 MG tablet Take 1 tablet (40 mg total) by mouth daily. 90 tablet 3  . venlafaxine XR (EFFEXOR-XR) 150 MG 24 hr capsule TAKE ONE CAPSULE BY MOUTH DAILY WITH BREAKFAST 100 capsule 0  . venlafaxine XR (EFFEXOR-XR) 37.5 MG 24 hr capsule TAKE 1 CAPSULE BY MOUTH EVERY DAY WITH BREAKFAST 90 capsule 0  . diphenoxylate-atropine (LOMOTIL) 2.5-0.025 MG per tablet Take 1 tablet by mouth 4 (four) times daily as needed for diarrhea or loose stools. (Patient not taking: Reported on 04/09/2015) 30 tablet 0   No current facility-administered medications on file prior to visit.    BP 110/72 mmHg  Pulse 83  Temp(Src) 98.4 F (36.9 C) (Oral)  Resp 18  Ht 5' 3.25" (1.607 m)  Wt 126 lb (57.153 kg)  BMI 22.13 kg/m2  SpO2 97%     Review of Systems  Constitutional: Negative.   HENT:  Negative for congestion, dental problem, hearing loss, rhinorrhea, sinus pressure, sore throat and tinnitus.   Eyes: Negative for pain, discharge and visual disturbance.  Respiratory: Negative for cough and shortness of breath.   Cardiovascular: Negative for chest pain, palpitations and leg swelling.  Gastrointestinal: Positive for diarrhea. Negative for nausea, vomiting, abdominal pain, constipation, blood in stool and abdominal distention.  Genitourinary: Negative for dysuria, urgency, frequency, hematuria, flank pain, vaginal bleeding, vaginal discharge, difficulty urinating, vaginal pain and pelvic pain.  Musculoskeletal: Negative for joint swelling, arthralgias and gait problem.  Skin: Negative for rash.  Neurological: Negative for dizziness, syncope, speech difficulty, weakness, numbness and headaches.  Hematological: Negative for adenopathy.  Psychiatric/Behavioral: Negative for behavioral problems, dysphoric mood and agitation. The patient is not nervous/anxious.        Objective:   Physical Exam  Constitutional: She is oriented to person, place, and time. She appears well-developed and well-nourished. No distress.  HENT:  Head: Normocephalic.  Right Ear: External ear normal.  Left Ear: External ear normal.  Mouth/Throat: Oropharynx is clear and moist.  Appears well-hydrated  Eyes: Conjunctivae and EOM are normal. Pupils are equal, round, and reactive to light.  Neck: Normal range of motion. Neck supple. No thyromegaly present.  Cardiovascular: Normal rate, regular rhythm, normal heart sounds and intact distal pulses.   No tachycardia  Pulmonary/Chest: Effort normal and breath sounds normal.  Abdominal: Soft. Bowel sounds are normal. She exhibits no distension and no mass. There is no tenderness. There is no rebound and no guarding.  Musculoskeletal: Normal range of motion.  Lymphadenopathy:    She has no cervical adenopathy.  Neurological: She is alert and oriented to person,  place, and time.  Skin: Skin is warm and dry. No rash noted.  Psychiatric: She has a normal mood and affect. Her behavior is normal.          Assessment & Plan:   Chronic diarrhea of 2 months duration. History of positive C. difficile toxin.  We'll check screening labs.  Repeat stool for C. difficile toxin, WBC and qualitative fat.  Refer GI.  Consider other testing such as celiac screening and/or colonoscopy

## 2015-04-09 NOTE — Telephone Encounter (Signed)
Patient was seen by Dr Kirtland Bouchard

## 2015-04-09 NOTE — Patient Instructions (Addendum)
Chronic Diarrhea  Diarrhea is frequent loose and watery bowel movements. It can cause you to feel weak and dehydrated. Dehydration can cause you to become tired and thirsty and to have a dry mouth, decreased urination, and dark yellow urine. Diarrhea is a sign of another problem, most often an infection that will not last long. In most cases, diarrhea lasts 2-3 days. Diarrhea that lasts longer than 4 weeks is called long-lasting (chronic) diarrhea. It is important to treat your diarrhea as directed by your health care provider to lessen or prevent future episodes of diarrhea.   CAUSES   There are many causes of chronic diarrhea. The following are some possible causes:   · Gastrointestinal infections caused by viruses, bacteria, or parasites.    · Food poisoning or food allergies.    · Certain medicines, such as antibiotics, chemotherapy, and laxatives.    · Artificial sweeteners and fructose.    · Digestive disorders, such as celiac disease and inflammatory bowel diseases.    · Irritable bowel syndrome.  · Some disorders of the pancreas.  · Disorders of the thyroid.  · Reduced blood flow to the intestines.  · Cancer.  Sometimes the cause of chronic diarrhea is unknown.  RISK FACTORS  · Having a severely weakened immune system, such as from HIV or AIDS.    · Taking certain types of cancer-fighting drugs (such as with chemotherapy) or other medicines.    · Having had a recent organ transplant.    · Having a portion of the stomach or small bowel removed.    · Traveling to countries where food and water supplies are often contaminated.    SYMPTOMS   In addition to frequent, loose stools, diarrhea may cause:   · Cramping.    · Abdominal pain.    · Nausea.    · Fever.  · Fatigue.  · Urgent need to use the bathroom.  · Loss of bowel control.  DIAGNOSIS   Your health care provider must take a careful history and perform a physical exam. Tests given are based on your symptoms and history. Tests may include:   · Blood or  stool tests. Three or more stool samples may be examined. Stool cultures may be used to test for bacteria or parasites.    · X-rays.    · A procedure in which a thin tube is inserted into the mouth or rectum (endoscopy). This allows the health care provider to look inside the intestine.    TREATMENT   · Treatment is aimed at correcting the cause of the diarrhea when possible.  · Diarrhea caused by an infection can often be treated with antibiotic medicines.  · Diarrhea not caused by an infection may require you to take long-term medicine or have surgery. Specific treatment should be discussed with your health care provider.  · If the cause cannot be determined, treatment aims to relieve symptoms and prevent dehydration. Serious health problems can occur if you do not maintain proper fluid levels. Treatment may include:  ¨ Taking an oral rehydration solution (ORS).  ¨ Not drinking beverages that contain caffeine (such as tea, coffee, and soft drinks).  ¨ Not drinking alcohol.  ¨ Maintaining well-balanced nutrition to help you recover faster.  HOME CARE INSTRUCTIONS   · Drink enough fluids to keep urine clear or pale yellow. Drink 1 cup (8 oz) of fluid for each diarrhea episode. Avoid fluids that contain simple sugars, fruit juices, whole milk products, and sodas. Hydrate with an ORS. You may purchase the ORS or prepare it at home by mixing the   following ingredients together:  ¨  - tsp (1.7-3  mL) table salt.  ¨ ¾ tsp (3 ¾ mL) baking soda.  ¨  tsp (1.7 mL) salt substitute containing potassium chloride.  ¨ 1 tbsp (20 mL) sugar.  ¨ 4.2 c (1 L) of water.    · Certain foods and beverages may increase the speed at which food moves through the gastrointestinal (GI) tract. These foods and beverages should be avoided. They include:  ¨ Caffeinated and alcoholic beverages.  ¨ High-fiber foods, such as raw fruits and vegetables, nuts, seeds, and whole grain breads and cereals.  ¨ Foods and beverages sweetened with sugar  alcohols, such as xylitol, sorbitol, and mannitol.    · Some foods may be well tolerated and may help thicken stool. These include:  ¨ Starchy foods, such as rice, toast, pasta, low-sugar cereal, oatmeal, grits, baked potatoes, crackers, and bagels.  ¨ Bananas.  ¨ Applesauce.  · Add probiotic-rich foods to help increase healthy bacteria in the GI tract. These include yogurt and fermented milk products.  · Wash your hands well after each diarrhea episode.  · Only take over-the-counter or prescription medicines as directed by your health care provider.  · Take a warm bath to relieve any burning or pain from frequent diarrhea episodes.  SEEK MEDICAL CARE IF:   · You are not urinating as often.  · Your urine is a dark color.  · You become very tired or dizzy.  · You have severe pain in the abdomen or rectum.  · Your have blood or pus in your stools.  · Your stools look black and tarry.  SEEK IMMEDIATE MEDICAL CARE IF:   · You are unable to keep fluids down.  · You have persistent vomiting.  · You have blood in your stool.  · Your stools are black and tarry.  · You do not urinate in 6-8 hours, or there is only a small amount of very dark urine.  · You have abdominal pain that increases or localizes.  · You have weakness, dizziness, confusion, or lightheadedness.  · You have a severe headache.  · Your diarrhea gets worse or does not get better.  · You have a fever or persistent symptoms for more than 2-3 days.  · You have a fever and your symptoms suddenly get worse.  MAKE SURE YOU:   · Understand these instructions.  · Will watch your condition.  · Will get help right away if you are not doing well or get worse.  Document Released: 09/24/2003 Document Revised: 07/09/2013 Document Reviewed: 12/27/2012  [employer]® Patient Information ©2015 [employer]. This information is not intended to replace advice given to you by your health care provider. Make sure you discuss any questions you have with your health care  provider.

## 2015-04-09 NOTE — Telephone Encounter (Signed)
Patient Name: Tonya Pope DOB: [birth year] Initial Comment Caller states diarrhea and has been seen in the ooffice Nurse Assessment Nurse: Elijah Birk, RN, Stark Bray Date/Time (Eastern Time): 04/09/2015 12:19:54 PM Confirm and document reason for call. If symptomatic, describe symptoms. ---Caller states diarrhea and has been seen in the office. Was on Flagyl, finished it Tuesday. Still having frequent watery diarrhea. They did a stool culture. Has had some abdominal pain with the episodes. No fever. Has been queasy. Has the patient traveled out of the country within the last 30 days? ---No Does the patient require triage? ---Yes Related visit to physician within the last 2 weeks? ---Yes Does the PT have any chronic conditions? (i.e. diabetes, asthma, etc.) ---Yes List chronic conditions. ---[employer] Guidelines Guideline Title Affirmed Question Affirmed Notes Diarrhea [1] MODERATE diarrhea (e.g., 4-6 times / day more than normal) AND [2] present > 48 hours (2 days) Final Disposition User See Physician within 24 Hours [place], RN, [employer] Referrals REFERRED TO PCP OFFICE Disagree/Comply: Danella Maiers

## 2015-04-09 NOTE — Progress Notes (Signed)
Pre visit review using our clinic review tool, if applicable. No additional management support is needed unless otherwise documented below in the visit note. 

## 2015-04-09 NOTE — Addendum Note (Signed)
Addended by: Bonnye Fava on: 04/09/2015 04:31 PM   Modules accepted: Orders

## 2015-04-14 ENCOUNTER — Other Ambulatory Visit: Payer: Self-pay | Admitting: *Deleted

## 2015-04-14 ENCOUNTER — Telehealth: Payer: Self-pay | Admitting: *Deleted

## 2015-04-14 LAB — CLOSTRIDIUM DIFFICILE BY PCR: Toxigenic C. Difficile by PCR: DETECTED — CR

## 2015-04-14 MED ORDER — METRONIDAZOLE 500 MG PO TABS: 500.0000 mg | ORAL_TABLET | Freq: Three times a day (TID) | ORAL | Status: DC

## 2015-04-14 NOTE — Telephone Encounter (Signed)
Pt aware and treated, see result note.

## 2015-04-14 NOTE — Telephone Encounter (Signed)
CRITICAL VALUE STICKER  CRITICAL VALUE: + C-Diff  RECEIVER (INITALS): DO  DATE & TIME NOTIFIED: 04/14/2015 at 13:35  MD NOTIFIED: Dr. Amador Cunas  TIME OF NOTIFICATION: 13:40 verbally notified and message sent.  RESPONSE:

## 2015-04-15 LAB — FECAL LACTOFERRIN, QUANT: Lactoferrin: POSITIVE

## 2015-04-16 ENCOUNTER — Telehealth: Payer: Self-pay | Admitting: Family Medicine

## 2015-04-16 ENCOUNTER — Other Ambulatory Visit: Payer: Self-pay | Admitting: Internal Medicine

## 2015-04-16 MED ORDER — VANCOMYCIN HCL 125 MG PO CAPS: 125.0000 mg | ORAL_CAPSULE | Freq: Four times a day (QID) | ORAL | Status: DC

## 2015-04-16 NOTE — Telephone Encounter (Signed)
Noted  

## 2015-04-16 NOTE — Telephone Encounter (Signed)
Patient notified and discussed.  Will discontinue metronidazole and start vancomycin 125 mg 4 times daily for 10 days orally.  Prescription has been ordered

## 2015-04-16 NOTE — Telephone Encounter (Signed)
Pt said she saw Dr Kirtland Bouchard last week for a CD infection and was give the following med metroNIDAZOLE (FLAGYL) 500 MG tablet. Pt said she has been having some itching issues since taking this med and would like to know what to do. Request a call back

## 2015-04-16 NOTE — Telephone Encounter (Signed)
Please see message and advise 

## 2015-04-23 LAB — FECAL FAT, QUANTITATIVE

## 2015-04-24 ENCOUNTER — Encounter: Payer: Self-pay | Admitting: Physician Assistant

## 2015-04-24 ENCOUNTER — Telehealth: Payer: Self-pay | Admitting: Family Medicine

## 2015-04-24 NOTE — Telephone Encounter (Signed)
Spoke with patient and will follow the directions for Dr Kirtland Bouchard.  She would like Dr Nelida Meuse advise.

## 2015-04-24 NOTE — Telephone Encounter (Signed)
Completed antibiotics Align 1 daily Follow-up Dr. Tawanna Cooler on Tuesday

## 2015-04-24 NOTE — Telephone Encounter (Signed)
Pt calling to check status of referral to GI, advised pt referral had been submitted and per the notes Christy from GI had called the patient a few weeks ago to schedule her an appt but she had to leave a message.  Transferred pt to GI to schedule appt.  Pt wanted to let pcp know she is still not better and she will finish her antibiotics in 2 days.  Pt wants to know what she should do in the mean time until she can get in with gastroenterologist.

## 2015-04-28 ENCOUNTER — Telehealth: Payer: Self-pay | Admitting: Gastroenterology

## 2015-04-28 NOTE — Telephone Encounter (Signed)
I reviewed her notes.  Looks like she's had diarrhea for 2-3 months.  We will do our best to get her in asap but recent staffing issues have been difficult for [place].     Looks like [facility] has a spot Monday 10/17. Please offer that time.

## 2015-04-28 NOTE — Telephone Encounter (Signed)
Informed pt that Dr.todd has made this urgent and is trying to get pt seen this week by GI office

## 2015-04-28 NOTE — Telephone Encounter (Signed)
Doc of the Day Dr Tawanna Cooler insists this patient be seen this week. 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 Former Magazine features editor patient. No openings. No openings on extender schedule until 05/08/15. He was super insistent that she be seen this week.

## 2015-04-28 NOTE — Telephone Encounter (Signed)
Pt states she finished antibiotic on Sunday. Pt still has infection.  Would like to know what she should do and  today!! This has been going on all summer. Pt would like dr todd's advice asap

## 2015-04-28 NOTE — Telephone Encounter (Signed)
I have her on for 05/08/15. Other appointment is already taken. She states she completed her Vancomycin on Sunday. 4-8 urgent stools daily. On probiotics. Afebrile. No pain. Feels anorexic but tolerating PO's. Do I tell her to call the PCP about the atb's?

## 2015-04-29 ENCOUNTER — Other Ambulatory Visit: Payer: Self-pay

## 2015-04-29 MED ORDER — VANCOMYCIN HCL 125 MG PO CAPS: ORAL_CAPSULE | ORAL | Status: DC

## 2015-04-29 NOTE — Telephone Encounter (Signed)
Wow that appt went fast.  She should restart vancomycin course, long tapering course 125mg  po qid for 1 week, tid for 1 week, bid for 1 week, qd for 1 week, qod for 2 weeks, then off.

## 2015-04-29 NOTE — Telephone Encounter (Signed)
Patient is instructed. Rx sent to [facility] per the patient's request. She states she did not have any issues with her last Vancomycin prescription from there.

## 2015-05-08 ENCOUNTER — Encounter: Payer: Self-pay | Admitting: Gastroenterology

## 2015-05-08 ENCOUNTER — Other Ambulatory Visit: Payer: Self-pay | Admitting: Adult Health

## 2015-05-08 ENCOUNTER — Ambulatory Visit (INDEPENDENT_AMBULATORY_CARE_PROVIDER_SITE_OTHER): Payer: 59 | Admitting: Gastroenterology

## 2015-05-08 VITALS — BP 118/78 | HR 68 | Ht 63.25 in | Wt 127.0 lb

## 2015-05-08 DIAGNOSIS — A047 Enterocolitis due to Clostridium difficile: Secondary | ICD-10-CM | POA: Diagnosis not present

## 2015-05-08 DIAGNOSIS — A0472 Enterocolitis due to Clostridium difficile, not specified as recurrent: Secondary | ICD-10-CM | POA: Insufficient documentation

## 2015-05-08 MED ORDER — RANITIDINE HCL 300 MG PO TABS: ORAL_TABLET | ORAL | Status: DC

## 2015-05-08 NOTE — Progress Notes (Signed)
Given persistent symptoms and repeated positive stools for C Diff would recommend a switch and trial of fidaxomycin at this time. I would also consider adding sacchromyces boulardii (probiotic) which as evidence to lower risk of recurrence. She may warrant a stool transplant if symptoms persist despite multiple regimens of ABx.

## 2015-05-08 NOTE — Progress Notes (Signed)
05/08/2015 Tonya Pope [id number] [birth year]   HISTORY OF PRESENT ILLNESS:  This is a pleasant 64 year old female who is known to Dr. Arlyce Dice previously. Her care will be assumed by Dr. Adela Lank in Dr. Marzetta Board absence. She is being referred back here on this occasion by her PCP, Dr. Tawanna Cooler, regarding recent C. difficile infection. She does admit that she was on an antibiotic prescribed from her dentist back at the end of April. She believes that this was probably clindamycin, but is not 100% sure. She has had some intermittent diarrhea on and off in the past, but starting in July, she started with severe diarrhea several times a day. She says that immediately after she eats she has to use restroom. Having up to 6 or so bowel movements daily, but no nocturnal bowel movements. She denies any abdominal pain or rectal bleeding. Cdiff was positive on 8/31.  She was treated with a 14 day course of Flagyl, however, the diarrhea persisted. A second course of Flagyl was started, but then she became very itchy. This was changed to vancomycin and she completed a course of that as well.  Diarrhea persisted and Cdiff was again positive on 9/22.  She is now on a Vancomycin taper and so far still has not improvement in her diarrhea.  Is becoming very frustrated with this illness.  Her last colonoscopy was in March 2008 at which time the study was normal and repeat was recommended in 10 years from that time.     Past Medical History  Diagnosis Date  . GERD (gastroesophageal reflux disease)   . OP (osteoporosis)   . Migraine   . Acne   . Insomnia   . Depression   . Colon polyps    Past Surgical History  Procedure Laterality Date  . Abdominal hysterectomy    . Colonoscopy w/ polypectomy    . Tonsillectomy and adenoidectomy      reports that she has quit smoking. She does not have any smokeless tobacco history on file. Her alcohol and drug histories are not on file. family history includes  Arthritis in her mother; Cancer in her other; Diabetes in her mother; Glaucoma in her father; Heart disease in her mother; Hyperlipidemia in her father; Hypertension in her mother; Stroke in her mother; Thyroid disease in her sister. Allergies  Allergen Reactions  . Codeine Phosphate     REACTION: nausea  . Penicillins     REACTION: rash  . Promethazine Hcl     REACTION: CNS S.E.      Outpatient Encounter Prescriptions as of 05/08/2015  Medication Sig  . conjugated estrogens (PREMARIN) vaginal cream Place 0.5 Applicatorfuls vaginally daily.  . cyclobenzaprine (FLEXERIL) 10 MG tablet Take 1 tablet (10 mg total) by mouth 3 (three) times daily as needed for muscle spasms.  . diphenoxylate-atropine (LOMOTIL) 2.5-0.025 MG per tablet Take 1 tablet by mouth 4 (four) times daily as needed for diarrhea or loose stools.  . indomethacin (INDOCIN) 50 MG capsule TAKE ONE CAPSULE BY MOUTH DAILY  . pantoprazole (PROTONIX) 40 MG tablet Take 1 tablet (40 mg total) by mouth daily.  . vancomycin (VANCOCIN) 125 MG capsule Take 4 times daily x7days-3 times a day x7 days-2 times a day x7 days-1 time a day for x7 days-1 time daily every other day x14 days  . venlafaxine XR (EFFEXOR-XR) 150 MG 24 hr capsule TAKE ONE CAPSULE BY MOUTH DAILY WITH BREAKFAST  . venlafaxine XR (EFFEXOR-XR) 37.5 MG 24 hr capsule  TAKE 1 CAPSULE BY MOUTH EVERY DAY WITH BREAKFAST  . ranitidine (ZANTAC) 300 MG tablet Take 300 mg daily.  . [DISCONTINUED] dicyclomine (BENTYL) 10 MG capsule Take 1 capsule (10 mg total) by mouth 4 (four) times daily -  before meals and at bedtime. (Patient not taking: Reported on 05/08/2015)  . [DISCONTINUED] metroNIDAZOLE (FLAGYL) 500 MG tablet Take 1 tablet (500 mg total) by mouth 3 (three) times daily. (Patient not taking: Reported on 05/08/2015)  . [DISCONTINUED] omeprazole (PRILOSEC) 20 MG capsule TAKE 1 CAPSULE BY MOUTH DAILY (Patient not taking: Reported on 05/08/2015)   No facility-administered  encounter medications on file as of 05/08/2015.     REVIEW OF SYSTEMS  : All other systems reviewed and negative except where noted in the History of Present Illness.   PHYSICAL EXAM: BP 118/78 mmHg  Pulse 68  Ht 5' 3.25" (1.607 m)  Wt 127 lb (57.607 kg)  BMI 22.31 kg/m2 General: Well developed white female in no acute distress Head: Normocephalic and atraumatic Eyes:  Sclerae anicteric, conjunctiva pink. Ears: Normal auditory acuity Lungs: Clear throughout to auscultation Heart: Regular rate and rhythm Abdomen: Soft, non-distended.  Normal bowel sounds.  Non-tender. Musculoskeletal: Symmetrical with no gross deformities  Skin: No lesions on visible extremities Extremities: No edema  Neurological: Alert oriented x 4, grossly non-focal Psychological:  Alert and cooperative. Normal mood and affect  ASSESSMENT AND PLAN: -Cdiff:  Continues despite 14 day course of Flagyl, one course of Vancomycin, and now still no improvement in symptoms so far on Vancomycin taper.  She will continue the Vancomycin taper.  She is on Florastor but will increase that to twice daily.  We will discontinue her pantoprazole and just have her take Zantac 300 mg daily, which we will prescribe.  ? Need for Dificid if this continues.  Hopefully she will not get to the point of fecal transplant.  Patient is frustrated with this illness.  Will refer to ID, probably Dr. Drue SecondSnider, just for their input especially if she continue to be non-responsive to the Vancomycin.  Try to avoid using Lomotil if possible.  CC:  Roderick Peeodd, Jeffrey A, MD

## 2015-05-08 NOTE — Patient Instructions (Addendum)
Discontinue pantoprazole sodium 40 mg.  Increase Florastor to twice daily.  We sent refills to [facility] [address], [place] for Zantac 300 mg, daily.   You will get a call from Infectious disease for [facility].   Their number is [phone].

## 2015-05-12 ENCOUNTER — Telehealth: Payer: Self-pay | Admitting: *Deleted

## 2015-05-12 NOTE — Telephone Encounter (Signed)
I called and did a prior authorization for Zantac 300 mg.  Code is [id number].  This will be sent to pharmacist for review.  They will fax me back the answer regarding approval with 72 hours.

## 2015-05-14 ENCOUNTER — Telehealth: Payer: Self-pay | Admitting: Gastroenterology

## 2015-05-15 ENCOUNTER — Ambulatory Visit: Payer: 59 | Admitting: Physician Assistant

## 2015-05-15 ENCOUNTER — Telehealth: Payer: Self-pay | Admitting: *Deleted

## 2015-05-15 NOTE — Telephone Encounter (Signed)
See note from 10-28 -2016.

## 2015-05-15 NOTE — Telephone Encounter (Signed)
Called [facility] of [place], [phone] and left message for someone there to call me regarding this patient, I put in a referral for ID but havn't heard anything. Called the patient to advise I am waiting to hear about an appointment.

## 2015-05-20 ENCOUNTER — Telehealth: Payer: Self-pay | Admitting: Family Medicine

## 2015-05-20 NOTE — Telephone Encounter (Signed)
I called the patient and asked if she knew she has an appointment with ID on 06-30-2015 at 9:00am.  She said they did call her with that appointment and address and directions to their office. I called ID and asked if they could put her on a wait list for a sooner appointment? They just had someone cancel today so they changed the appointment to 06-16-2015 @ 9:00 am .  The patient's insurance just changed and she is calling Dr. Nelida Meuseodd's office for a new referral for this appointment.

## 2015-05-20 NOTE — Telephone Encounter (Signed)
Pt also needs a new referral to Infectious disease due to a change in health insurance.

## 2015-05-20 NOTE — Telephone Encounter (Signed)
Patient saw Tonya Pope in August and had a referral for a MRI. Patient cancelled appointment and patient has new insurance and needs a new referral.

## 2015-05-21 ENCOUNTER — Other Ambulatory Visit: Payer: 59

## 2015-05-21 NOTE — Telephone Encounter (Signed)
Ok for MRI. Has she seen ID in the past? And for what?

## 2015-05-21 NOTE — Telephone Encounter (Signed)
Ok to do this 

## 2015-05-22 NOTE — Telephone Encounter (Signed)
Called and spoke with pt and pt states she wanted the referral to ID for the c.diff because she is still having symptoms and is still taking medications.  Advised pt that she has an existing appt for 11.29.16 with ID. A new referral is not needed. MRI entered again.

## 2015-05-26 NOTE — Telephone Encounter (Signed)
Spoke with Tammara at [facility] and they stated that our office would have to get pt's MRI authorized with the new insurance and then pt could be scheduled.

## 2015-05-29 NOTE — Telephone Encounter (Signed)
MRI authorized through new insurance.

## 2015-06-04 ENCOUNTER — Ambulatory Visit
Admission: RE | Admit: 2015-06-04 | Discharge: 2015-06-04 | Disposition: A | Discharge status: Home / Self Care | Payer: PPO | Source: Ambulatory Visit | Attending: Adult Health | Admitting: Adult Health

## 2015-06-04 DIAGNOSIS — M5442 Lumbago with sciatica, left side: Secondary | ICD-10-CM

## 2015-06-05 ENCOUNTER — Telehealth: Payer: Self-pay | Admitting: Adult Health

## 2015-06-05 NOTE — Telephone Encounter (Signed)
Updated patient on MRI results. She does not wish to see Ortho at this time.

## 2015-06-08 ENCOUNTER — Other Ambulatory Visit: Payer: Self-pay | Admitting: Family Medicine

## 2015-06-15 ENCOUNTER — Encounter: Payer: Self-pay | Admitting: Internal Medicine

## 2015-06-16 ENCOUNTER — Ambulatory Visit (INDEPENDENT_AMBULATORY_CARE_PROVIDER_SITE_OTHER): Payer: PPO | Admitting: Internal Medicine

## 2015-06-16 ENCOUNTER — Encounter: Payer: Self-pay | Admitting: Internal Medicine

## 2015-06-16 DIAGNOSIS — A047 Enterocolitis due to Clostridium difficile: Secondary | ICD-10-CM

## 2015-06-16 DIAGNOSIS — A0472 Enterocolitis due to Clostridium difficile, not specified as recurrent: Secondary | ICD-10-CM

## 2015-06-16 MED ORDER — FIDAXOMICIN 200 MG PO TABS
200.0000 mg | ORAL_TABLET | Freq: Two times a day (BID) | ORAL | Status: DC
Start: 2015-06-16 — End: 2015-06-16

## 2015-06-16 MED ORDER — RIFAXIMIN 200 MG PO TABS: 200.0000 mg | ORAL_TABLET | Freq: Three times a day (TID) | ORAL | Status: DC

## 2015-06-16 NOTE — Progress Notes (Signed)
[facility]  Reason for Consult: Persistent C. difficile colitis Referring Physician: Dr. Alonza Smoker  Patient Active Problem List   Diagnosis Date Noted  . Clostridium difficile diarrhea 05/08/2015    Priority: High  . Abdominal pain, epigastric 03/27/2014  . Panic attacks 02/17/2014  . Osteoarthritis of right hip 10/31/2013  . MITRAL VALVE PROLAPSE 08/03/2009  . History of colonic polyps 05/08/2008  . Depression (emotion) 04/17/2008  . GERD 03/28/2007  . Hematuria 03/28/2007  . OSTEOPOROSIS 03/28/2007  . Insomnia 03/28/2007    Patient's Medications  New Prescriptions   RIFAXIMIN (XIFAXAN) 200 MG TABLET    Take 1 tablet (200 mg total) by mouth 3 (three) times daily.  Previous Medications   CONJUGATED ESTROGENS (PREMARIN) VAGINAL CREAM    Place 0.5 Applicatorfuls vaginally daily.   CYCLOBENZAPRINE (FLEXERIL) 10 MG TABLET    TAKE 1 TABLET(10 MG) BY MOUTH THREE TIMES DAILY AS NEEDED FOR MUSCLE SPASMS   INDOMETHACIN (INDOCIN) 50 MG CAPSULE    TAKE ONE CAPSULE BY MOUTH DAILY   PANTOPRAZOLE (PROTONIX) 40 MG TABLET    Take 1 tablet (40 mg total) by mouth daily.   VENLAFAXINE XR (EFFEXOR-XR) 150 MG 24 HR CAPSULE    TAKE ONE CAPSULE BY MOUTH DAILY WITH BREAKFAST   VENLAFAXINE XR (EFFEXOR-XR) 37.5 MG 24 HR CAPSULE    TAKE 1 CAPSULE BY MOUTH EVERY DAY WITH BREAKFAST  Modified Medications   No medications on file  Discontinued Medications   DIPHENOXYLATE-ATROPINE (LOMOTIL) 2.5-0.025 MG PER TABLET    Take 1 tablet by mouth 4 (four) times daily as needed for diarrhea or loose stools.   RANITIDINE (ZANTAC) 300 MG TABLET    Take 300 mg daily.   VANCOMYCIN (VANCOCIN) 125 MG CAPSULE    Take 4 times daily x7days-3 times a day x7 days-2 times a day x7 days-1 time a day for x7 days-1 time daily every other day x14 days    Recommendations: 1. Rifaximin 200 mg twice daily for 14 days  2. Follow-up in 6 weeks  Assessment: She has recurrent/persistent C.  difficile colitis that has now started to improve following a long vancomycin taper. I reviewed treatment options with her and will finish up therapy with a 2 week course of rifaximin. If her diarrhea returns following this her options would include a course of the fidaxomycin and/or fecal transplantation. If her diarrhea recurs I will schedule a follow-up visit with my partner, Dr. Judyann Munson, who oversees her fecal transplantation protocol.  HPI: Tonya Pope is a 64 y.o. female who took clindamycin in April after having a root canal. She began to develop watery diarrhea in July and was found to be C. difficile toxin positive on 03/18/2015. She was treated with oral metronidazole for 14 days but had persistent diarrhea. She started a second course of metronidazole but developed itching and will switch to oral vancomycin which he took for 14 days. She had slight improvement but the diarrhea returned. She was positive for C. difficile toxin again on 04/09/2015. She started on a vancomycin taper and probiotics. Her pantoprazole was changed to ranitidine. She completed her vancomycin taper yesterday. She continues to take florastor. Her acid indigestion worsened and she had to switch back to pantoprazole recently. She is feeling better. She is no longer having any watery diarrhea. However her bowel movements are not back to normal. She continues to have several soft bowel movements daily, usually after eating. She  has had slight queasiness but no vomiting. She is no longer having any abdominal cramps.  Review of Systems: Review of Systems  Constitutional: Positive for weight loss. Negative for fever, chills, malaise/fatigue and diaphoresis.       She has lost about 6 pounds since her illness began.  HENT: Negative for sore throat.   Respiratory: Negative for cough, sputum production and shortness of breath.   Cardiovascular: Negative for chest pain.  Gastrointestinal: Positive for nausea and  diarrhea. Negative for vomiting and abdominal pain.  Genitourinary: Negative for dysuria and frequency.  Musculoskeletal: Negative for myalgias and joint pain.  Skin: Negative for rash.  Neurological: Negative for focal weakness.  Psychiatric/Behavioral: Negative for depression and substance abuse. The patient is not nervous/anxious.       Past Medical History  Diagnosis Date  . GERD (gastroesophageal reflux disease)   . OP (osteoporosis)   . Migraine   . Acne   . Insomnia   . Depression   . Colon polyps     Social History  Substance Use Topics  . Smoking status: Former Games developermoker  . Smokeless tobacco: None  . Alcohol Use: None    Family History  Problem Relation Age of Onset  . Stroke Mother   . Arthritis Mother   . Diabetes Mother   . Heart disease Mother   . Hypertension Mother   . Hyperlipidemia Father   . Glaucoma Father   . Thyroid disease Sister   . Cancer Other     breast,colon   Allergies  Allergen Reactions  . Codeine Phosphate     REACTION: nausea  . Penicillins     REACTION: rash  . Promethazine Hcl     REACTION: CNS S.E.    OBJECTIVE: Filed Vitals:   06/16/15 0909  BP: 122/80  Pulse: 73  Temp: 98.2 F (36.8 C)  Height: 5' 1.5" (1.562 m)  Weight: 123 lb 1.9 oz (55.847 kg)   Body mass index is 22.89 kg/(m^2).   Physical Exam  Constitutional: She is oriented to person, place, and time.  HENT:  Mouth/Throat: No oropharyngeal exudate.  Eyes: Conjunctivae are normal.  Cardiovascular: Normal rate and regular rhythm.   No murmur heard. Pulmonary/Chest: Breath sounds normal.  Abdominal: Soft. She exhibits no mass. There is no tenderness.  Musculoskeletal: Normal range of motion.  Neurological: She is alert and oriented to person, place, and time.  Skin: No rash noted.  Psychiatric: Mood and affect normal.    Microbiology: No results found for this or any previous visit (from the past 240 hour(s)).  Cliffton AstersJohn Melissaann Dizdarevic, MD [facility] [phone] pager   [phone] cell 06/16/2015, 9:42 AM

## 2015-06-19 ENCOUNTER — Telehealth: Payer: Self-pay | Admitting: Family Medicine

## 2015-06-19 NOTE — Telephone Encounter (Signed)
Ms. Shara BlazingHouglan called saying she received a letter from her ins carrier stating they need a prior auth for the Rx Indomethacin.  [employer] ph# [phone] or [phone]   /Fax# [phone] If you have questions or concerns, please contact the pt.  *Note: pt did say if she needs to switch to a different medication, she's willing to do so.  Pt's ph# [phone] Thank you

## 2015-06-25 NOTE — Telephone Encounter (Signed)
[name] can you assist me with this? I cannot tell that she has gotten this filled for a few years and there is no dx code associated with it. The PA requires a dx code to complete.

## 2015-06-25 NOTE — Telephone Encounter (Signed)
Dx for Indomethacin is osteoarthritis of right hip - code M16.11. It looks like the last time a refill was sent was 11/11/14

## 2015-06-30 ENCOUNTER — Ambulatory Visit: Payer: 59 | Admitting: Internal Medicine

## 2015-07-05 ENCOUNTER — Other Ambulatory Visit: Payer: Self-pay | Admitting: Family Medicine

## 2015-07-12 ENCOUNTER — Other Ambulatory Visit: Payer: Self-pay | Admitting: Family Medicine

## 2015-07-14 ENCOUNTER — Other Ambulatory Visit: Payer: Self-pay | Admitting: Family Medicine

## 2015-07-28 ENCOUNTER — Ambulatory Visit: Payer: PPO | Admitting: Internal Medicine

## 2015-07-31 ENCOUNTER — Encounter: Payer: Self-pay | Admitting: Internal Medicine

## 2015-07-31 ENCOUNTER — Ambulatory Visit (INDEPENDENT_AMBULATORY_CARE_PROVIDER_SITE_OTHER): Payer: PPO | Admitting: Internal Medicine

## 2015-07-31 VITALS — BP 119/72 | HR 72 | Temp 98.7°F | Ht 62.0 in | Wt 125.0 lb

## 2015-07-31 DIAGNOSIS — A0471 Enterocolitis due to Clostridium difficile, recurrent: Secondary | ICD-10-CM

## 2015-07-31 DIAGNOSIS — A047 Enterocolitis due to Clostridium difficile: Secondary | ICD-10-CM

## 2015-07-31 NOTE — Progress Notes (Signed)
Rfv: diarrhea, recurrent c.difficile Subjective:    Patient ID: Tonya HosePamella B Brownstein, female    DOB: 02/19/1951, 65 y.o.   MRN: [id number]  HPI 64yo F. Had dental work in April given clindamycin then developed watery diarrhea in may. Since then has had many course  Did well for 2 wk after finishing her course of fidaxomicin. But then started to have intermittent watery diarrhea.  Three a day BM, initially soft, then watery, cramping, urgency, occurs post prandial. Has taken lomotil that has helped  Colonoscopy 1163yr ago. Dr. Arlyce DiceKaplan.   - 5lb weight loss. Appetite back to normal. 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 Codeine Phosphate     REACTION: nausea  . Penicillins     REACTION: rash  . Promethazine Hcl     REACTION: CNS S.E.   Current Outpatient Prescriptions on File Prior to Visit  Medication Sig Dispense Refill  . conjugated estrogens (PREMARIN) vaginal cream Place 0.5 Applicatorfuls vaginally daily. 42.5 g 11  . cyclobenzaprine (FLEXERIL) 10 MG tablet TAKE 1 TABLET(10 MG) BY MOUTH THREE TIMES DAILY AS NEEDED FOR MUSCLE SPASMS 30 tablet 0  . indomethacin (INDOCIN) 50 MG capsule TAKE ONE CAPSULE BY MOUTH DAILY 90 capsule 0  . pantoprazole (PROTONIX) 40 MG tablet Take 1 tablet (40 mg total) by mouth daily. 90 tablet 3  . venlafaxine XR (EFFEXOR-XR) 150 MG 24 hr capsule TAKE ONE CAPSULE BY MOUTH DAILY WITH BREAKFAST 90 capsule 0  . venlafaxine XR (EFFEXOR-XR) 37.5 MG 24 hr capsule TAKE 1 CAPSULE BY MOUTH EVERY DAY WITH BREAKFAST 90 capsule 0  . rifaximin (XIFAXAN) 200 MG tablet Take 1 tablet (200 mg total) by mouth 3 (three) times daily. (Patient not taking: Reported on 07/31/2015) 42 tablet 0   No current facility-administered medications on file prior to visit.   Active Ambulatory Problems    Diagnosis Date Noted  . Depression (emotion) 04/17/2008  . MITRAL VALVE PROLAPSE 08/03/2009  . GERD  03/28/2007  . Hematuria 03/28/2007  . OSTEOPOROSIS 03/28/2007  . Insomnia 03/28/2007  . History of colonic polyps 05/08/2008  . Osteoarthritis of right hip 10/31/2013  . Panic attacks 02/17/2014  . Abdominal pain, epigastric 03/27/2014  . Clostridium difficile diarrhea 05/08/2015   Resolved Ambulatory Problems    Diagnosis Date Noted  . MIGRAINE HEADACHE 03/28/2007  . ACNE NEC 03/28/2007   Past Medical History  Diagnosis Date  . GERD (gastroesophageal reflux disease)   . OP (osteoporosis)   . Migraine   . Acne   . Depression   . Colon polyps    Social History  Substance Use Topics  . Smoking status: Former Games developermoker  . Smokeless tobacco: None  . Alcohol Use: None  family history includes Arthritis in her mother; Cancer in her other; Diabetes in her mother; Glaucoma in her father; Heart disease in her mother; Hyperlipidemia in her father; Hypertension in her mother; Stroke in her mother; Thyroid disease in her sister.  Review of Systems Review of Systems  Constitutional: Negative for fever, chills, diaphoresis, activity change, appetite change, fatigue and unexpected weight change.  HENT: Negative for congestion, sore throat, rhinorrhea, sneezing, trouble swallowing and sinus pressure.  Eyes: Negative for photophobia and visual disturbance.  Respiratory: Negative for cough, chest tightness, shortness of breath, wheezing and stridor.  Cardiovascular: Negative for chest pain, palpitations and leg swelling.  Gastrointestinal: + loose stools per hpi. Negative for  nausea, vomiting, abdominal pain, diarrhea, constipation, blood in stool, abdominal distention and anal bleeding.  Genitourinary: Negative for dysuria, hematuria, flank pain and difficulty urinating.  Musculoskeletal: Negative for myalgias, back pain, joint swelling, arthralgias and gait problem.  Skin: Negative for color change, pallor, rash and wound.  Neurological: Negative for dizziness, tremors, weakness and  light-headedness.  Hematological: Negative for adenopathy. Does not bruise/bleed easily.  Psychiatric/Behavioral: Negative for behavioral problems, confusion, sleep disturbance, dysphoric mood, decreased concentration and agitation.       Objective:   Physical Exam BP 119/72 mmHg  Pulse 72  Temp(Src) 98.7 F (37.1 C) (Oral)  Ht 5\' 2"  (1.575 m)  Wt 125 lb (56.7 kg)  BMI 22.86 kg/m2 Physical Exam  Constitutional:  oriented to person, place, and time. appears well-developed and well-nourished. No distress.  HENT: [place]/AT, PERRLA, no scleral icterus Mouth/Throat: Oropharynx is clear and moist. No oropharyngeal exudate.  Cardiovascular: Normal rate, regular rhythm and normal heart sounds. Exam reveals no gallop and no friction rub.  No murmur heard.  Pulmonary/Chest: Effort normal and breath sounds normal. No respiratory distress.  has no wheezes.  Neck = supple, no nuchal rigidity Abdominal: Soft. Bowel sounds are normal.  exhibits no distension. There is no tenderness.  Lymphadenopathy: no cervical adenopathy. No axillary adenopathy Neurological: alert and oriented to person, place, and time.  Skin: Skin is warm and dry. No rash noted. No erythema.  Psychiatric: a normal mood and affect.  behavior is normal.       Assessment & Plan:  Recurrent c.difficile. = difficult to tell if she is having recurrence vs. Post-infectious inflammation.Plan on testing for cdiff. If positive, move towards FMT. 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  Will give 2 test kits  Give info on [facility]

## 2015-08-06 ENCOUNTER — Other Ambulatory Visit: Payer: PPO

## 2015-08-06 DIAGNOSIS — A0471 Enterocolitis due to Clostridium difficile, recurrent: Secondary | ICD-10-CM

## 2015-08-07 LAB — CLOSTRIDIUM DIFFICILE BY PCR: CDIFFPCR: NOT DETECTED

## 2015-08-13 ENCOUNTER — Other Ambulatory Visit: Payer: Self-pay | Admitting: Internal Medicine

## 2015-08-13 ENCOUNTER — Telehealth: Payer: Self-pay | Admitting: *Deleted

## 2015-08-13 DIAGNOSIS — K589 Irritable bowel syndrome without diarrhea: Secondary | ICD-10-CM

## 2015-08-13 MED ORDER — DICYCLOMINE HCL 20 MG PO TABS: 20.0000 mg | ORAL_TABLET | Freq: Three times a day (TID) | ORAL | Status: DC

## 2015-08-13 NOTE — Telephone Encounter (Signed)
Let her know it is in

## 2015-08-13 NOTE — Telephone Encounter (Signed)
Patient notified

## 2015-08-13 NOTE — Telephone Encounter (Signed)
Patient called triage asking if her bentyl has been called in.  Please advise.  Not on med list. Andree Coss, RN

## 2015-09-02 ENCOUNTER — Other Ambulatory Visit: Payer: Self-pay | Admitting: Family Medicine

## 2015-09-03 ENCOUNTER — Ambulatory Visit: Payer: PPO | Admitting: Internal Medicine

## 2015-09-07 ENCOUNTER — Other Ambulatory Visit: Payer: Self-pay | Admitting: Family Medicine

## 2015-10-27 ENCOUNTER — Encounter: Payer: Self-pay | Admitting: Adult Health

## 2015-10-27 ENCOUNTER — Ambulatory Visit (INDEPENDENT_AMBULATORY_CARE_PROVIDER_SITE_OTHER): Payer: PPO | Admitting: Adult Health

## 2015-10-27 VITALS — BP 128/64 | Temp 98.8°F | Wt 127.9 lb

## 2015-10-27 DIAGNOSIS — Z780 Asymptomatic menopausal state: Secondary | ICD-10-CM | POA: Diagnosis not present

## 2015-10-27 DIAGNOSIS — Z7189 Other specified counseling: Secondary | ICD-10-CM

## 2015-10-27 DIAGNOSIS — G4700 Insomnia, unspecified: Secondary | ICD-10-CM

## 2015-10-27 DIAGNOSIS — Z7689 Persons encountering health services in other specified circumstances: Secondary | ICD-10-CM

## 2015-10-27 MED ORDER — TRAZODONE HCL 50 MG PO TABS: 25.0000 mg | ORAL_TABLET | Freq: Every evening | ORAL | Status: DC | PRN

## 2015-10-27 NOTE — Patient Instructions (Addendum)
It was great seeing you again!  I have sent in a prescription for Trazodone, use this to help you sleep.   Follow up with me for your physical or sooner if you need anything.   It is great to have you on the team!  Menopause is a normal process in which your reproductive ability comes to an end. This process happens gradually over a span of months to years, usually between the ages of 34 and 7. Menopause is complete when you have missed 12 consecutive menstrual periods. It is important to talk with your health care provider about some of the most common conditions that affect postmenopausal women, such as heart disease, cancer, and bone loss (osteoporosis). Adopting a healthy lifestyle and getting preventive care can help to promote your health and wellness. Those actions can also lower your chances of developing some of these common conditions. WHAT SHOULD I KNOW ABOUT MENOPAUSE? During menopause, you may experience a number of symptoms, such as:  Moderate-to-severe hot flashes.  Night sweats.  Decrease in sex drive.  Mood swings.  Headaches.  Tiredness.  Irritability.  Memory problems.  Insomnia. Choosing to treat or not to treat menopausal changes is an individual decision that you make with your health care provider. WHAT SHOULD I KNOW ABOUT HORMONE REPLACEMENT THERAPY AND SUPPLEMENTS? Hormone therapy products are effective for treating symptoms that are associated with menopause, such as hot flashes and night sweats. Hormone replacement carries certain risks, especially as you become older. If you are thinking about using estrogen or estrogen with progestin treatments, discuss the benefits and risks with your health care provider. WHAT SHOULD I KNOW ABOUT HEART DISEASE AND STROKE? Heart disease, heart attack, and stroke become more likely as you age. This may be due, in part, to the hormonal changes that your body experiences during menopause. These can affect how your body  processes dietary fats, triglycerides, and cholesterol. Heart attack and stroke are both medical emergencies. There are many things that you can do to help prevent heart disease and stroke:  Have your blood pressure checked at least every 1-2 years. High blood pressure causes heart disease and increases the risk of stroke.  If you are 58-61 years old, ask your health care provider if you should take aspirin to prevent a heart attack or a stroke.  Do not use any tobacco products, including cigarettes, chewing tobacco, or electronic cigarettes. If you need help quitting, ask your health care provider.  It is important to eat a healthy diet and maintain a healthy weight.  Be sure to include plenty of vegetables, fruits, low-fat dairy products, and lean protein.  Avoid eating foods that are high in solid fats, added sugars, or salt (sodium).  Get regular exercise. This is one of the most important things that you can do for your health.  Try to exercise for at least 150 minutes each week. The type of exercise that you do should increase your heart rate and make you sweat. This is known as moderate-intensity exercise.  Try to do strengthening exercises at least twice each week. Do these in addition to the moderate-intensity exercise.  Know your numbers.Ask your health care provider to check your cholesterol and your blood glucose. Continue to have your blood tested as directed by your health care provider. WHAT SHOULD I KNOW ABOUT CANCER SCREENING? There are several types of cancer. Take the following steps to reduce your risk and to catch any cancer development as early as possible. Breast Cancer  Practice breast self-awareness.  This means understanding how your breasts normally appear and feel.  It also means doing regular breast self-exams. Let your health care provider know about any changes, no matter how small.  If you are 67 or older, have a clinician do a breast exam (clinical  breast exam or CBE) every year. Depending on your age, family history, and medical history, it may be recommended that you also have a yearly breast X-ray (mammogram).  If you have a family history of breast cancer, talk with your health care provider about genetic screening.  If you are at high risk for breast cancer, talk with your health care provider about having an MRI and a mammogram every year.  Breast cancer (BRCA) gene test is recommended for women who have family members with BRCA-related cancers. Results of the assessment will determine the need for genetic counseling and BRCA1 and for BRCA2 testing. BRCA-related cancers include these types:  Breast. This occurs in males or females.  Ovarian.  Tubal. This may also be called fallopian tube cancer.  Cancer of the abdominal or pelvic lining (peritoneal cancer).  Prostate.  Pancreatic. Cervical, Uterine, and Ovarian Cancer Your health care provider may recommend that you be screened regularly for cancer of the pelvic organs. These include your ovaries, uterus, and vagina. This screening involves a pelvic exam, which includes checking for microscopic changes to the surface of your cervix (Pap test).  For women ages 21-65, health care providers may recommend a pelvic exam and a Pap test every three years. For women ages 47-65, they may recommend the Pap test and pelvic exam, combined with testing for human papilloma virus (HPV), every five years. Some types of HPV increase your risk of cervical cancer. Testing for HPV may also be done on women of any age who have unclear Pap test results.  Other health care providers may not recommend any screening for nonpregnant women who are considered low risk for pelvic cancer and have no symptoms. Ask your health care provider if a screening pelvic exam is right for you.  If you have had past treatment for cervical cancer or a condition that could lead to cancer, you need Pap tests and screening  for cancer for at least 20 years after your treatment. If Pap tests have been discontinued for you, your risk factors (such as having a new sexual partner) need to be reassessed to determine if you should start having screenings again. Some women have medical problems that increase the chance of getting cervical cancer. In these cases, your health care provider may recommend that you have screening and Pap tests more often.  If you have a family history of uterine cancer or ovarian cancer, talk with your health care provider about genetic screening.  If you have vaginal bleeding after reaching menopause, tell your health care provider.  There are currently no reliable tests available to screen for ovarian cancer. Lung Cancer Lung cancer screening is recommended for adults 70-66 years old who are at high risk for lung cancer because of a history of smoking. A yearly low-dose CT scan of the lungs is recommended if you:  Currently smoke.  Have a history of at least 30 pack-years of smoking and you currently smoke or have quit within the past 15 years. A pack-year is smoking an average of one pack of cigarettes per day for one year. Yearly screening should:  Continue until it has been 15 years since you quit.  Stop if you  develop a health problem that would prevent you from having lung cancer treatment. Colorectal Cancer  This type of cancer can be detected and can often be prevented.  Routine colorectal cancer screening usually begins at age 59 and continues through age 52.  If you have risk factors for colon cancer, your health care provider may recommend that you be screened at an earlier age.  If you have a family history of colorectal cancer, talk with your health care provider about genetic screening.  Your health care provider may also recommend using home test kits to check for hidden blood in your stool.  A small camera at the end of a tube can be used to examine your colon  directly (sigmoidoscopy or colonoscopy). This is done to check for the earliest forms of colorectal cancer.  Direct examination of the colon should be repeated every 5-10 years until age 52. However, if early forms of precancerous polyps or small growths are found or if you have a family history or genetic risk for colorectal cancer, you may need to be screened more often. Skin Cancer  Check your skin from head to toe regularly.  Monitor any moles. Be sure to tell your health care provider:  About any new moles or changes in moles, especially if there is a change in a mole's shape or color.  If you have a mole that is larger than the size of a pencil eraser.  If any of your family members has a history of skin cancer, especially at a young age, talk with your health care provider about genetic screening.  Always use sunscreen. Apply sunscreen liberally and repeatedly throughout the day.  Whenever you are outside, protect yourself by wearing long sleeves, pants, a wide-brimmed hat, and sunglasses. WHAT SHOULD I KNOW ABOUT OSTEOPOROSIS? Osteoporosis is a condition in which bone destruction happens more quickly than new bone creation. After menopause, you may be at an increased risk for osteoporosis. To help prevent osteoporosis or the bone fractures that can happen because of osteoporosis, the following is recommended:  If you are 69-24 years old, get at least 1,000 mg of calcium and at least 600 mg of vitamin D per day.  If you are older than age 53 but younger than age 29, get at least 1,200 mg of calcium and at least 600 mg of vitamin D per day.  If you are older than age 1, get at least 1,200 mg of calcium and at least 800 mg of vitamin D per day. Smoking and excessive alcohol intake increase the risk of osteoporosis. Eat foods that are rich in calcium and vitamin D, and do weight-bearing exercises several times each week as directed by your health care provider. WHAT SHOULD I KNOW  ABOUT HOW MENOPAUSE AFFECTS [place]? Depression may occur at any age, but it is more common as you become older. Common symptoms of depression include:  Low or sad mood.  Changes in sleep patterns.  Changes in appetite or eating patterns.  Feeling an overall lack of motivation or enjoyment of activities that you previously enjoyed.  Frequent crying spells. Talk with your health care provider if you think that you are experiencing depression. WHAT SHOULD I KNOW ABOUT IMMUNIZATIONS? It is important that you get and maintain your immunizations. These include:  Tetanus, diphtheria, and pertussis (Tdap) booster vaccine.  Influenza every year before the flu season begins.  Pneumonia vaccine.  Shingles vaccine. Your health care provider may also recommend other immunizations.  This information is not intended to replace advice given to you by your health care provider. Make sure you discuss any questions you have with your health care provider.   Document Released: 08/26/2005 Document Revised: 07/25/2014 Document Reviewed: 03/06/2014 Elsevier Interactive Patient Education [employer].

## 2015-10-27 NOTE — Progress Notes (Signed)
Patient presents to clinic today to establish care.She is a pleasant caucasian female.   Her last physical was in 2015.   Acute Concerns: Establish Cafre  Chronic Issues: Insomnia-  She is currently taking Unisom for her sleep issues. She has a worse time sleeping when she goes on trips. Has tried Melatonin in the past.   Depression - She feels as though this is well controlled. She has recently cut back to 150 mg of Effexor and feels great.   Health Maintenance: Dental -- Twice a year Vision -- Yearly  Immunizations -- Needs Pneumonia shot.  Colonoscopy -- 2011 - 10 year plan  Mammogram -- 2015  PAP -- No longer needs Bone Density -- 2014-   Diet: She tries to eat healthy  Exercise: Does not exercise.   Is not followed by anyone else   Past Medical History  Diagnosis Date  . GERD (gastroesophageal reflux disease)   . OP (osteoporosis)   . Migraine   . Acne   . Insomnia   . Depression   . Colon polyps     Past Surgical History  Procedure Laterality Date  . Abdominal hysterectomy    . Colonoscopy w/ polypectomy    . Tonsillectomy and adenoidectomy      Current Outpatient Prescriptions on File Prior to Visit  Medication Sig Dispense Refill  . conjugated estrogens (PREMARIN) vaginal cream Place 0.5 Applicatorfuls vaginally daily. 42.5 g 11  . cyclobenzaprine (FLEXERIL) 10 MG tablet TAKE 1 TABLET(10 MG) BY MOUTH THREE TIMES DAILY AS NEEDED FOR MUSCLE SPASMS 30 tablet 0  . dicyclomine (BENTYL) 20 MG tablet Take 1 tablet (20 mg total) by mouth 3 (three) times daily before meals. 90 tablet 1  . indomethacin (INDOCIN) 50 MG capsule TAKE ONE CAPSULE BY MOUTH DAILY 90 capsule 0  . pantoprazole (PROTONIX) 40 MG tablet Take 1 tablet (40 mg total) by mouth daily. 90 tablet 3  . rifaximin (XIFAXAN) 200 MG tablet Take 1 tablet (200 mg total) by mouth 3 (three) times daily. 42 tablet 0  . venlafaxine XR (EFFEXOR-XR) 150 MG 24 hr capsule TAKE ONE CAPSULE BY MOUTH DAILY WITH  BREAKFAST 90 capsule 0  . venlafaxine XR (EFFEXOR-XR) 150 MG 24 hr capsule TAKE ONE CAPSULE BY MOUTH DAILY WITH BREAKFAST 100 capsule 0  . venlafaxine XR (EFFEXOR-XR) 37.5 MG 24 hr capsule TAKE 1 CAPSULE BY MOUTH EVERY DAY WITH BREAKFAST 90 capsule 0   No current facility-administered medications on file prior to visit.    Allergies  Allergen Reactions  . Codeine Phosphate     REACTION: nausea  . Penicillins     REACTION: rash  . Promethazine Hcl     REACTION: CNS S.E.    Family History  Problem Relation Age of Onset  . Stroke Mother   . Arthritis Mother   . Diabetes Mother   . Heart disease Mother   . Hypertension Mother   . Hyperlipidemia Father   . Glaucoma Father   . Thyroid disease Sister   . Cancer Other     breast,colon    Social History   Social History  . Marital Status: Married    Spouse Name: N/A  . Number of Children: N/A  . Years of Education: N/A   Occupational History  . Not on file.   Social History Main Topics  . Smoking status: Former Games developermoker  . Smokeless tobacco: Not on file  . Alcohol Use: Not on file  . Drug Use: Not  on file  . Sexual Activity: Not on file   Other Topics Concern  . Not on file   Social History Narrative    Review of Systems  Constitutional: Negative.   HENT: Negative.   Eyes: Negative.   Respiratory: Negative.   Cardiovascular: Negative.   Gastrointestinal: Negative.   Genitourinary: Negative.   Musculoskeletal: Negative.   Skin: Negative.   Neurological: Negative.   Endo/Heme/Allergies: Negative.   Psychiatric/Behavioral: Negative.     BP 128/64 mmHg  Temp(Src) 98.8 F (37.1 C) (Oral)  Wt 127 lb 14.4 oz (58.015 kg)  Physical Exam  Constitutional: She is oriented to person, place, and time and well-developed, well-nourished, and in no distress. No distress.  HENT:  Head: Normocephalic and atraumatic.  Right Ear: External ear normal.  Left Ear: External ear normal.  Nose: Nose normal.  Mouth/Throat:  Oropharynx is clear and moist. No oropharyngeal exudate.  Eyes: Conjunctivae and EOM are normal. Pupils are equal, round, and reactive to light. Right eye exhibits no discharge. Left eye exhibits no discharge.  Cardiovascular: Normal rate, regular rhythm, normal heart sounds and intact distal pulses.  Exam reveals no gallop and no friction rub.   No murmur heard. Pulmonary/Chest: Effort normal and breath sounds normal. No respiratory distress. She has no wheezes. She has no rales. She exhibits no tenderness.  Neurological: She is alert and oriented to person, place, and time. Gait normal. GCS score is 15.  Skin: Skin is warm and dry. No rash noted. She is not diaphoretic. No erythema. No pallor.  Psychiatric: Mood, memory, affect and judgment normal.  Nursing note and vitals reviewed.   Recent Results (from the past 2160 hour(s))  Clostridium Difficile by PCR     Status: None   Collection Time: 08/06/15  5:20 PM  Result Value Ref Range   Toxigenic C Difficile by pcr Not Detected Not Detected    Comment: This test is for use only with liquid or soft stools; performance characteristics of other clinical specimen types have not been established.   This assay was performed by Cepheid GeneXpert(R) PCR. The performance characteristics of this assay have been determined by [employer]. Performance characteristics refer to the analytical performance of the test.     Assessment/Plan: 1. Post-menopausal - MM DIGITAL SCREENING BILATERAL; Future - DG Bone Density; Future  2. Encounter to establish care - Follow up for CPE - Follow up sooner if needed - Would like her to start exercising - especially weight baring exercises for her bone health  3. Insomnia - Can trial melatonin  - traZODone (DESYREL) 50 MG tablet; Take 0.5-1 tablets (25-50 mg total) by mouth at bedtime as needed for sleep.  Dispense: 30 tablet; Refill: 3  Shirline Frees, NP

## 2015-10-28 ENCOUNTER — Other Ambulatory Visit: Payer: Self-pay | Admitting: Adult Health

## 2015-10-30 ENCOUNTER — Other Ambulatory Visit: Payer: Self-pay | Admitting: Family Medicine

## 2015-10-30 ENCOUNTER — Other Ambulatory Visit: Payer: Self-pay | Admitting: Adult Health

## 2015-10-30 ENCOUNTER — Encounter: Payer: Self-pay | Admitting: Adult Health

## 2015-10-30 DIAGNOSIS — Z1231 Encounter for screening mammogram for malignant neoplasm of breast: Secondary | ICD-10-CM

## 2015-10-30 DIAGNOSIS — E2839 Other primary ovarian failure: Secondary | ICD-10-CM

## 2015-11-02 ENCOUNTER — Other Ambulatory Visit: Payer: Self-pay | Admitting: Adult Health

## 2015-11-02 DIAGNOSIS — M810 Age-related osteoporosis without current pathological fracture: Secondary | ICD-10-CM

## 2015-11-05 ENCOUNTER — Telehealth: Payer: Self-pay | Admitting: Adult Health

## 2015-11-05 NOTE — Telephone Encounter (Signed)
whats the name of the medication

## 2015-11-05 NOTE — Telephone Encounter (Signed)
Renee needs a prior authorization for a medication for the patient.    Reference number for the call: [id number]

## 2015-11-06 NOTE — Telephone Encounter (Signed)
I'm not sure of the name of the medication because she said she would talk to you about it when you called her back.

## 2015-11-09 ENCOUNTER — Telehealth: Payer: Self-pay | Admitting: *Deleted

## 2015-11-09 NOTE — Telephone Encounter (Signed)
Pt need PA for cyclobenzaprine (FLEXERIL) 10 MG tablet

## 2015-11-09 NOTE — Telephone Encounter (Signed)
Prior Authorization for Cyclobenzaprine has been denied.  Reason for denial: medication is considered a High Risk Medication for members 6565 or older.

## 2015-11-09 NOTE — Telephone Encounter (Signed)
PA was denied. Denied due to medication being high risk for anyone over age 66. Patient is aware.

## 2015-11-09 NOTE — Telephone Encounter (Signed)
Called patient and she is aware of PA being denied.

## 2015-11-28 ENCOUNTER — Other Ambulatory Visit: Payer: Self-pay | Admitting: Family Medicine

## 2015-11-30 ENCOUNTER — Ambulatory Visit
Admission: RE | Admit: 2015-11-30 | Discharge: 2015-11-30 | Disposition: A | Discharge status: Home / Self Care | Payer: PPO | Source: Ambulatory Visit | Attending: Adult Health | Admitting: Adult Health

## 2015-11-30 DIAGNOSIS — E2839 Other primary ovarian failure: Secondary | ICD-10-CM

## 2015-11-30 DIAGNOSIS — Z1231 Encounter for screening mammogram for malignant neoplasm of breast: Secondary | ICD-10-CM

## 2015-12-01 ENCOUNTER — Other Ambulatory Visit: Payer: Self-pay | Admitting: Adult Health

## 2015-12-01 DIAGNOSIS — R928 Other abnormal and inconclusive findings on diagnostic imaging of breast: Secondary | ICD-10-CM

## 2015-12-04 ENCOUNTER — Ambulatory Visit
Admission: RE | Admit: 2015-12-04 | Discharge: 2015-12-04 | Disposition: A | Discharge status: Home / Self Care | Payer: PPO | Source: Ambulatory Visit | Attending: Adult Health | Admitting: Adult Health

## 2015-12-04 DIAGNOSIS — R928 Other abnormal and inconclusive findings on diagnostic imaging of breast: Secondary | ICD-10-CM

## 2015-12-09 ENCOUNTER — Other Ambulatory Visit (INDEPENDENT_AMBULATORY_CARE_PROVIDER_SITE_OTHER): Payer: PPO

## 2015-12-09 ENCOUNTER — Other Ambulatory Visit: Payer: Self-pay | Admitting: Family Medicine

## 2015-12-09 DIAGNOSIS — M810 Age-related osteoporosis without current pathological fracture: Secondary | ICD-10-CM | POA: Diagnosis not present

## 2015-12-09 DIAGNOSIS — Z0000 Encounter for general adult medical examination without abnormal findings: Secondary | ICD-10-CM

## 2015-12-09 LAB — BASIC METABOLIC PANEL
BUN: 21 mg/dL (ref 6–23)
CALCIUM: 10 mg/dL (ref 8.4–10.5)
CO2: 29 mEq/L (ref 19–32)
CREATININE: 0.96 mg/dL (ref 0.40–1.20)
Chloride: 105 mEq/L (ref 96–112)
GFR: 61.89 mL/min (ref >60.00)
GLUCOSE: 81 mg/dL (ref 70–99)
Potassium: 4.2 mEq/L (ref 3.5–5.1)
Sodium: 140 mEq/L (ref 135–145)

## 2015-12-09 LAB — POC URINALSYSI DIPSTICK (AUTOMATED)
BILIRUBIN UA: NEGATIVE
Glucose, UA: NEGATIVE
Ketones, UA: NEGATIVE
LEUKOCYTES UA: NEGATIVE
NITRITE UA: NEGATIVE
PH UA: 5.5
PROTEIN UA: NEGATIVE
Spec Grav, UA: 1.025
Urobilinogen, UA: 0.2

## 2015-12-09 LAB — CBC WITH DIFFERENTIAL/PLATELET
BASOS ABS: 0 10*3/uL (ref 0.0–0.1)
Basophils Relative: 0.1 % (ref 0.0–3.0)
EOS PCT: 4.1 % (ref 0.0–5.0)
Eosinophils Absolute: 0.2 10*3/uL (ref 0.0–0.7)
HCT: 35.2 % — ABNORMAL LOW (ref 36.0–46.0)
Hemoglobin: 12 g/dL (ref 12.0–15.0)
LYMPHS ABS: 2 10*3/uL (ref 0.7–4.0)
Lymphocytes Relative: 36.6 % (ref 12.0–46.0)
MCHC: 34 g/dL (ref 30.0–36.0)
MCV: 95.7 fl (ref 78.0–100.0)
MONO ABS: 0.4 10*3/uL (ref 0.1–1.0)
Monocytes Relative: 7.2 % (ref 3.0–12.0)
NEUTROS PCT: 52 % (ref 43.0–77.0)
Neutro Abs: 2.8 10*3/uL (ref 1.4–7.7)
Platelets: 235 10*3/uL (ref 150.0–400.0)
RBC: 3.68 Mil/uL — AB (ref 3.87–5.11)
RDW: 14.1 % (ref 11.5–15.5)
WBC: 5.5 10*3/uL (ref 4.0–10.5)

## 2015-12-09 LAB — LIPID PANEL
CHOL/HDL RATIO: 3
CHOLESTEROL: 181 mg/dL (ref 0–200)
HDL: 65.6 mg/dL (ref >39.00)
LDL Cholesterol: 99 mg/dL (ref 0–99)
NonHDL: 115.65
TRIGLYCERIDES: 81 mg/dL (ref 0.0–149.0)
VLDL: 16.2 mg/dL (ref 0.0–40.0)

## 2015-12-09 LAB — HEPATIC FUNCTION PANEL
ALT: 16 U/L (ref 0–35)
AST: 17 U/L (ref 0–37)
Albumin: 4.5 g/dL (ref 3.5–5.2)
Alkaline Phosphatase: 59 U/L (ref 39–117)
BILIRUBIN TOTAL: 0.6 mg/dL (ref 0.2–1.2)
Bilirubin, Direct: 0.1 mg/dL (ref 0.0–0.3)
Total Protein: 6.6 g/dL (ref 6.0–8.3)

## 2015-12-09 LAB — TSH: TSH: 2.26 u[IU]/mL (ref 0.35–4.50)

## 2015-12-10 LAB — VITAMIN D 25 HYDROXY (VIT D DEFICIENCY, FRACTURES): VITD: 34.48 ng/mL (ref 30.00–100.00)

## 2015-12-11 ENCOUNTER — Other Ambulatory Visit: Payer: PPO

## 2015-12-18 ENCOUNTER — Ambulatory Visit (INDEPENDENT_AMBULATORY_CARE_PROVIDER_SITE_OTHER): Payer: PPO | Admitting: Adult Health

## 2015-12-18 ENCOUNTER — Encounter: Payer: Self-pay | Admitting: Adult Health

## 2015-12-18 VITALS — BP 138/72 | Temp 98.1°F | Ht 61.0 in | Wt 128.5 lb

## 2015-12-18 DIAGNOSIS — G5603 Carpal tunnel syndrome, bilateral upper limbs: Secondary | ICD-10-CM

## 2015-12-18 DIAGNOSIS — R319 Hematuria, unspecified: Secondary | ICD-10-CM

## 2015-12-18 DIAGNOSIS — Z23 Encounter for immunization: Secondary | ICD-10-CM

## 2015-12-18 DIAGNOSIS — Z760 Encounter for issue of repeat prescription: Secondary | ICD-10-CM

## 2015-12-18 DIAGNOSIS — K2100 Gastro-esophageal reflux disease with esophagitis, without bleeding: Secondary | ICD-10-CM

## 2015-12-18 DIAGNOSIS — F32A Depression, unspecified: Secondary | ICD-10-CM

## 2015-12-18 DIAGNOSIS — Z0001 Encounter for general adult medical examination with abnormal findings: Secondary | ICD-10-CM | POA: Diagnosis not present

## 2015-12-18 DIAGNOSIS — N760 Acute vaginitis: Secondary | ICD-10-CM

## 2015-12-18 DIAGNOSIS — F410 Panic disorder [episodic paroxysmal anxiety] without agoraphobia: Secondary | ICD-10-CM

## 2015-12-18 DIAGNOSIS — F329 Major depressive disorder, single episode, unspecified: Secondary | ICD-10-CM

## 2015-12-18 DIAGNOSIS — Z0000 Encounter for general adult medical examination without abnormal findings: Secondary | ICD-10-CM

## 2015-12-18 MED ORDER — VENLAFAXINE HCL ER 150 MG PO CP24: 150.0000 mg | ORAL_CAPSULE | Freq: Every day | ORAL | Status: DC

## 2015-12-18 MED ORDER — TIZANIDINE HCL 4 MG PO TABS: 4.0000 mg | ORAL_TABLET | Freq: Four times a day (QID) | ORAL | Status: DC | PRN

## 2015-12-18 MED ORDER — ESTROGENS, CONJUGATED 0.625 MG/GM VA CREA: 1.0000 g | TOPICAL_CREAM | Freq: Every day | VAGINAL | Status: DC

## 2015-12-18 MED ORDER — PREDNISONE 20 MG PO TABS: 20.0000 mg | ORAL_TABLET | Freq: Every day | ORAL | Status: DC

## 2015-12-18 MED ORDER — PANTOPRAZOLE SODIUM 40 MG PO TBEC: 40.0000 mg | DELAYED_RELEASE_TABLET | Freq: Every day | ORAL | Status: DC

## 2015-12-18 NOTE — Patient Instructions (Addendum)
It was great seeing you again!  I have sent in all your chronic medications  For the carpal tunnel - take 20 mg prednisone every morning for the next two weeks. If that does not work, let me know  Follow up with me in one year for your next physical or sooner with any acute issues.     Health Maintenance, Female Adopting a healthy lifestyle and getting preventive care can go a long way to promote health and wellness. Talk with your health care provider about what schedule of regular examinations is right for you. This is a good chance for you to check in with your provider about disease prevention and staying healthy. In between checkups, there are plenty of things you can do on your own. Experts have done a lot of research about which lifestyle changes and preventive measures are most likely to keep you healthy. Ask your health care provider for more information. WEIGHT AND DIET  Eat a healthy diet  Be sure to include plenty of vegetables, fruits, low-fat dairy products, and lean protein.  Do not eat a lot of foods high in solid fats, added sugars, or salt.  Get regular exercise. This is one of the most important things you can do for your health.  Most adults should exercise for at least 150 minutes each week. The exercise should increase your heart rate and make you sweat (moderate-intensity exercise).  Most adults should also do strengthening exercises at least twice a week. This is in addition to the moderate-intensity exercise.  Maintain a healthy weight  Body mass index (BMI) is a measurement that can be used to identify possible weight problems. It estimates body fat based on height and weight. Your health care provider can help determine your BMI and help you achieve or maintain a healthy weight.  For females 9 years of age and older:   A BMI below 18.5 is considered underweight.  A BMI of 18.5 to 24.9 is normal.  A BMI of 25 to 29.9 is considered overweight.  A BMI  of 30 and above is considered obese.  Watch levels of cholesterol and blood lipids  You should start having your blood tested for lipids and cholesterol at 66 years of age, then have this test every 5 years.  You may need to have your cholesterol levels checked more often if:  Your lipid or cholesterol levels are high.  You are older than 66 years of age.  You are at high risk for heart disease.  CANCER SCREENING   Lung Cancer  Lung cancer screening is recommended for adults 97-92 years old who are at high risk for lung cancer because of a history of smoking.  A yearly low-dose CT scan of the lungs is recommended for people who:  Currently smoke.  Have quit within the past 15 years.  Have at least a 30-pack-year history of smoking. A pack year is smoking an average of one pack of cigarettes a day for 1 year.  Yearly screening should continue until it has been 15 years since you quit.  Yearly screening should stop if you develop a health problem that would prevent you from having lung cancer treatment.  Breast Cancer  Practice breast self-awareness. This means understanding how your breasts normally appear and feel.  It also means doing regular breast self-exams. Let your health care provider know about any changes, no matter how small.  If you are in your 20s or 30s, you should have a  clinical breast exam (CBE) by a health care provider every 1-3 years as part of a regular health exam.  If you are 40 or older, have a CBE every year. Also consider having a breast X-ray (mammogram) every year.  If you have a family history of breast cancer, talk to your health care provider about genetic screening.  If you are at high risk for breast cancer, talk to your health care provider about having an MRI and a mammogram every year.  Breast cancer gene (BRCA) assessment is recommended for women who have family members with BRCA-related cancers. BRCA-related cancers  include:  Breast.  Ovarian.  Tubal.  Peritoneal cancers.  Results of the assessment will determine the need for genetic counseling and BRCA1 and BRCA2 testing. Cervical Cancer Your health care provider may recommend that you be screened regularly for cancer of the pelvic organs (ovaries, uterus, and vagina). This screening involves a pelvic examination, including checking for microscopic changes to the surface of your cervix (Pap test). You may be encouraged to have this screening done every 3 years, beginning at age 21.  For women ages 30-65, health care providers may recommend pelvic exams and Pap testing every 3 years, or they may recommend the Pap and pelvic exam, combined with testing for human papilloma virus (HPV), every 5 years. Some types of HPV increase your risk of cervical cancer. Testing for HPV may also be done on women of any age with unclear Pap test results.  Other health care providers may not recommend any screening for nonpregnant women who are considered low risk for pelvic cancer and who do not have symptoms. Ask your health care provider if a screening pelvic exam is right for you.  If you have had past treatment for cervical cancer or a condition that could lead to cancer, you need Pap tests and screening for cancer for at least 20 years after your treatment. If Pap tests have been discontinued, your risk factors (such as having a new sexual partner) need to be reassessed to determine if screening should resume. Some women have medical problems that increase the chance of getting cervical cancer. In these cases, your health care provider may recommend more frequent screening and Pap tests. Colorectal Cancer  This type of cancer can be detected and often prevented.  Routine colorectal cancer screening usually begins at 66 years of age and continues through 66 years of age.  Your health care provider may recommend screening at an earlier age if you have risk factors for  colon cancer.  Your health care provider may also recommend using home test kits to check for hidden blood in the stool.  A small camera at the end of a tube can be used to examine your colon directly (sigmoidoscopy or colonoscopy). This is done to check for the earliest forms of colorectal cancer.  Routine screening usually begins at age 50.  Direct examination of the colon should be repeated every 5-10 years through 66 years of age. However, you may need to be screened more often if early forms of precancerous polyps or small growths are found. Skin Cancer  Check your skin from head to toe regularly.  Tell your health care provider about any new moles or changes in moles, especially if there is a change in a mole's shape or color.  Also tell your health care provider if you have a mole that is larger than the size of a pencil eraser.  Always use sunscreen. Apply sunscreen liberally   and repeatedly throughout the day.  Protect yourself by wearing long sleeves, pants, a wide-brimmed hat, and sunglasses whenever you are outside. HEART DISEASE, DIABETES, AND HIGH BLOOD PRESSURE   High blood pressure causes heart disease and increases the risk of stroke. High blood pressure is more likely to develop in:  People who have blood pressure in the high end of the normal range (130-139/85-89 mm Hg).  People who are overweight or obese.  People who are African American.  If you are 68-18 years of age, have your blood pressure checked every 3-5 years. If you are 96 years of age or older, have your blood pressure checked every year. You should have your blood pressure measured twice--once when you are at a hospital or clinic, and once when you are not at a hospital or clinic. Record the average of the two measurements. To check your blood pressure when you are not at a hospital or clinic, you can use:  An automated blood pressure machine at a pharmacy.  A home blood pressure monitor.  If you  are between 52 years and 37 years old, ask your health care provider if you should take aspirin to prevent strokes.  Have regular diabetes screenings. This involves taking a blood sample to check your fasting blood sugar level.  If you are at a normal weight and have a low risk for diabetes, have this test once every three years after 66 years of age.  If you are overweight and have a high risk for diabetes, consider being tested at a younger age or more often. PREVENTING INFECTION  Hepatitis B  If you have a higher risk for hepatitis B, you should be screened for this virus. You are considered at high risk for hepatitis B if:  You were born in a country where hepatitis B is common. Ask your health care provider which countries are considered high risk.  Your parents were born in a high-risk country, and you have not been immunized against hepatitis B (hepatitis B vaccine).  You have HIV or AIDS.  You use needles to inject street drugs.  You live with someone who has hepatitis B.  You have had sex with someone who has hepatitis B.  You get hemodialysis treatment.  You take certain medicines for conditions, including cancer, organ transplantation, and autoimmune conditions. Hepatitis C  Blood testing is recommended for:  Everyone born from [birth year] through 1966.  Anyone with known risk factors for hepatitis C. Sexually transmitted infections (STIs)  You should be screened for sexually transmitted infections (STIs) including gonorrhea and chlamydia if:  You are sexually active and are younger than 66 years of age.  You are older than 66 years of age and your health care provider tells you that you are at risk for this type of infection.  Your sexual activity has changed since you were last screened and you are at an increased risk for chlamydia or gonorrhea. Ask your health care provider if you are at risk.  If you do not have HIV, but are at risk, it may be recommended that you  take a prescription medicine daily to prevent HIV infection. This is called pre-exposure prophylaxis (PrEP). You are considered at risk if:  You are sexually active and do not regularly use condoms or know the HIV status of your partner(s).  You take drugs by injection.  You are sexually active with a partner who has HIV. Talk with your health care provider about whether you are  at high risk of being infected with HIV. If you choose to begin PrEP, you should first be tested for HIV. You should then be tested every 3 months for as long as you are taking PrEP.  PREGNANCY   If you are premenopausal and you may become pregnant, ask your health care provider about preconception counseling.  If you may become pregnant, take 400 to 800 micrograms (mcg) of folic acid every day.  If you want to prevent pregnancy, talk to your health care provider about birth control (contraception). OSTEOPOROSIS AND MENOPAUSE   Osteoporosis is a disease in which the bones lose minerals and strength with aging. This can result in serious bone fractures. Your risk for osteoporosis can be identified using a bone density scan.  If you are 56 years of age or older, or if you are at risk for osteoporosis and fractures, ask your health care provider if you should be screened.  Ask your health care provider whether you should take a calcium or vitamin D supplement to lower your risk for osteoporosis.  Menopause may have certain physical symptoms and risks.  Hormone replacement therapy may reduce some of these symptoms and risks. Talk to your health care provider about whether hormone replacement therapy is right for you.  HOME CARE INSTRUCTIONS   Schedule regular health, dental, and eye exams.  Stay current with your immunizations.   Do not use any tobacco products including cigarettes, chewing tobacco, or electronic cigarettes.  If you are pregnant, do not drink alcohol.  If you are breastfeeding, limit how  much and how often you drink alcohol.  Limit alcohol intake to no more than 1 drink per day for nonpregnant women. One drink equals 12 ounces of beer, 5 ounces of wine, or 1 ounces of hard liquor.  Do not use street drugs.  Do not share needles.  Ask your health care provider for help if you need support or information about quitting drugs.  Tell your health care provider if you often feel depressed.  Tell your health care provider if you have ever been abused or do not feel safe at home.   This information is not intended to replace advice given to you by your health care provider. Make sure you discuss any questions you have with your health care provider.   Document Released: 01/17/2011 Document Revised: 07/25/2014 Document Reviewed: 06/05/2013 Elsevier Interactive Patient Education [employer].

## 2015-12-18 NOTE — Progress Notes (Signed)
Subjective:    Patient ID: Tonya Pope, female    DOB: November 04, 1950, 65 y.o.   MRN: [id number]  HPI  Patient presents for yearly preventative medicine examination. She is a pleasant 65 year old caucasian female who  has a past medical history of GERD (gastroesophageal reflux disease); OP (osteoporosis); Migraine; Acne; Insomnia; Depression; Colon polyps; and Diarrhea.  All immunizations and health maintenance protocols were reviewed with the patient and needed orders were placed.  Medication reconciliation,  past medical history, social history, problem list and allergies were reviewed in detail with the patient  Goals were established with regard to weight loss, exercise, and  diet in compliance with medications  End of life planning was discussed- she has an advanced directive and living will.   She is up to date on health maintenance items such as mammogram, bone density, colonoscopy, eye and dental exam. She does home breast exams.   Her only complaint today is that of carpal tunnel symptoms in both of her wrists. She reports that this has been an ongoing issue due to her working in the [employer]. She has been experiencing increased numbness and tinlging to her first three digits, more noticiably at night. She also reports that she frequently wakes up in the morning with her hands completely numb.    Review of Systems  Constitutional: Negative.   HENT: Negative.   Eyes: Negative.   Respiratory: Negative.   Cardiovascular: Negative.   Gastrointestinal: Negative.   Endocrine: Negative.   Genitourinary: Negative.   Skin: Negative.   Allergic/Immunologic: Negative.   Neurological: Positive for numbness.  Hematological: Negative.   Psychiatric/Behavioral: Negative.   All other systems reviewed and are negative.  Past Medical History  Diagnosis Date  . GERD (gastroesophageal reflux disease)   . OP (osteoporosis)   . Migraine   . Acne   . Insomnia   . Depression     . Colon polyps   . Diarrhea     C. Diff    Social History   Social History  . Marital Status: Married    Spouse Name: N/A  . Number of Children: N/A  . Years of Education: N/A   Occupational History  . Not on file.   Social History Main Topics  . Smoking status: Former Games developer  . Smokeless tobacco: Not on file  . Alcohol Use: 0.0 oz/week    0 Standard drinks or equivalent per week     Comment: occ  . Drug Use: Not on file  . Sexual Activity: Not on file   Other Topics Concern  . 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    Widowed    No kids   Likes to garden and read.          Past Surgical History  Procedure Laterality Date  . Abdominal hysterectomy    . Colonoscopy w/ polypectomy    . Tonsillectomy and adenoidectomy      Family History  Problem Relation Age of Onset  . Stroke Mother   . Arthritis Mother   . Diabetes Mother   . Heart disease Mother   . Hypertension Mother   . Hyperlipidemia Father   . Glaucoma Father   . Thyroid disease Sister   . Breast cancer Maternal Aunt   . Colon cancer Maternal Grandfather   . Depression Sister     Allergies  Allergen Reactions  .  Codeine Phosphate     REACTION: nausea  . Penicillins     REACTION: rash  . Promethazine Hcl     REACTION: CNS S.E.    Current Outpatient Prescriptions on File Prior to Visit  Medication Sig Dispense Refill  . dicyclomine (BENTYL) 20 MG tablet Take 1 tablet (20 mg total) by mouth 3 (three) times daily before meals. (Patient not taking: Reported on 12/18/2015) 90 tablet 1   No current facility-administered medications on file prior to visit.    BP 138/72 mmHg  Temp(Src) 98.1 F (36.7 C) (Oral)  Ht 5\' 1"  (1.549 m)  Wt 128 lb 8 oz (58.287 kg)  BMI 24.29 kg/m2       Objective:   Physical Exam  Constitutional: She is oriented to person, place, and time. She appears well-developed and well-nourished. No  distress.  Cardiovascular: Normal rate, regular rhythm and intact distal pulses.  Exam reveals no gallop and no friction rub.   Murmur heard. Pulmonary/Chest: Effort normal and breath sounds normal. No respiratory distress. She has no wheezes. She has no rales. She exhibits no tenderness.  Abdominal: Soft. Bowel sounds are normal. She exhibits no distension and no mass. There is no tenderness. There is no rebound and no guarding.  Genitourinary:  Breast Exam: No masses, lumps, dimpling, or discharge noted.   Musculoskeletal: Normal range of motion. She exhibits no edema or tenderness.  Neurological: She is alert and oriented to person, place, and time.  Skin: Skin is warm and dry. No rash noted. She is not diaphoretic. No erythema. No pallor.  Psychiatric: She has a normal mood and affect. Her behavior is normal. Judgment and thought content normal.  Nursing note and vitals reviewed.      Assessment & Plan:  1. Routine general medical examination at a health care facility Reviewed labs in detail with patient. All questions answereed - Continue with exercise and eating healthy - Follow up in one year or sooner if needed  2. Gastroesophageal reflux disease with esophagitis - Slightly controlled. She is going to trial taking medication BID - pantoprazole (PROTONIX) 40 MG tablet; Take 1 tablet (40 mg total) by mouth daily.  Dispense: 90 tablet; Refill: 3  3. Vaginitis - conjugated estrogens (PREMARIN) vaginal cream; Place 0.5 Applicatorfuls vaginally daily.  Dispense: 42.5 g; Refill: 11  4. Need for vaccination with 13-polyvalent pneumococcal conjugate vaccine - Pneumococcal conjugate vaccine 13-valent  5. Depression (emotion) - Well controlled   6. Hematuria - Has been evaluated by Urology in the past. No issues  7. Panic attacks - Well controlled  8. Bilateral carpal tunnel syndrome - predniSONE (DELTASONE) 20 MG tablet; Take 1 tablet (20 mg total) by mouth daily with  breakfast.  Dispense: 14 tablet; Refill: 0 - Consider referral to Sports Medicine or Neurology    9. Medication refill - venlafaxine XR (EFFEXOR-XR) 150 MG 24 hr capsule; Take 1 capsule (150 mg total) by mouth daily with breakfast.  Dispense: 90 capsule; Refill: 3 - pantoprazole (PROTONIX) 40 MG tablet; Take 1 tablet (40 mg total) by mouth daily.  Dispense: 90 tablet; Refill: 3 - tiZANidine (ZANAFLEX) 4 MG tablet; Take 1 tablet (4 mg total) by mouth every 6 (six) hours as needed for muscle spasms.  Dispense: 90 tablet; Refill: 3 - conjugated estrogens (PREMARIN) vaginal cream; Place 0.5 Applicatorfuls vaginally daily.  Dispense: 42.5 g; Refill: 11  Shirline Freesory Keshonda Monsour, NP

## 2015-12-23 ENCOUNTER — Telehealth: Payer: Self-pay | Admitting: Adult Health

## 2015-12-23 NOTE — Telephone Encounter (Signed)
[facility] - Client TELEPHONE ADVICE RECORD [facility]  Patient Name: Tonya Pope  DOB: 06/07/1951    Initial Comment Caller states c/o anxiety, sweating, insomnia and back pain. She is currently taking Prednisone 20mg  for back pain.   Nurse Assessment  Nurse: Dorthula RuePatten, RN, Enrique SackKendra Date/Time (Eastern Time): 12/23/2015 2:58:57 PM  Confirm and document reason for call. If symptomatic, describe symptoms. You must click the next button to save text entered. ---Caller states she has been on Prednisone 20 mg for 5 days for back pain. She states she has another week left and she does not think she can finish it. She is still having a lot of back pain. She states she is having dry mouth, sweating and jittery. She is also having insomnia.  Has the patient traveled out of the country within the last 30 days? ---Not Applicable  Does the patient have any new or worsening symptoms? ---Yes  Will a triage be completed? ---Yes  Related visit to physician within the last 2 weeks? ---Yes  Does the PT have any chronic conditions? (i.e. diabetes, asthma, etc.) ---Yes  List chronic conditions. ---Depression, Arthritis, Vitamin D Deficiency, Mitral Valve Prolapse  Is this a behavioral health or substance abuse call? ---No     Guidelines    Guideline Title Affirmed Question Affirmed Notes  Anxiety and Panic Attack Patient sounds very upset or troubled to the triager    Final Disposition User   See Physician within 24 Hours [place], RN, Enrique SackKendra    Comments  Scheduled with Dr Selena BattenKim at 5pm on 06/08   Referrals  REFERRED TO PCP OFFICE   Disagree/Comply: Comply

## 2015-12-23 NOTE — Telephone Encounter (Signed)
Pt scheduled to see Dr. Kim on 12/24/15 

## 2015-12-24 ENCOUNTER — Encounter: Payer: Self-pay | Admitting: Adult Health

## 2015-12-24 ENCOUNTER — Ambulatory Visit (INDEPENDENT_AMBULATORY_CARE_PROVIDER_SITE_OTHER): Payer: PPO | Admitting: Adult Health

## 2015-12-24 ENCOUNTER — Ambulatory Visit: Payer: Self-pay | Admitting: Family Medicine

## 2015-12-24 VITALS — BP 120/52 | Temp 98.1°F | Ht 61.0 in | Wt 128.7 lb

## 2015-12-24 DIAGNOSIS — T887XXA Unspecified adverse effect of drug or medicament, initial encounter: Secondary | ICD-10-CM

## 2015-12-24 DIAGNOSIS — M5442 Lumbago with sciatica, left side: Secondary | ICD-10-CM

## 2015-12-24 DIAGNOSIS — T50905A Adverse effect of unspecified drugs, medicaments and biological substances, initial encounter: Secondary | ICD-10-CM

## 2015-12-24 NOTE — Telephone Encounter (Signed)
[facility]

## 2015-12-24 NOTE — Progress Notes (Signed)
Subjective:    Patient ID: Tonya Pope, female    DOB: 02/13/1951, 65 y.o.   MRN: [id number]  HPI  65 year old female who presents to the office for   She reports that since she started  Prednisone 20 mg daily  for bilateral carpal tunnel pain, she has been feeling shaky, nervous, anxious, is unable to sleep, and feels like she cannot keep still. She tried taking half of a dose yesterday but continued to have the same feelings.   Today she did not take any prednisone and feels less hyper and anxious.   She is also complaining of worsening lower back pain with a pain down the left outside leg. Her last lumbar spine x ray was in 05/2015 which showed:   Spondylosis at L5-S1 contributes to moderate foraminal narrowing bilaterally and potential encroachment on either L5 nerve root.  Lesser spondylosis at the additional levels as detailed above. No other significant spinal stenosis.  She would like to see orthopedics about her lower back pain.    Review of Systems  Constitutional: Negative.   Respiratory: Negative.   Cardiovascular: Negative.   Musculoskeletal: Positive for back pain and arthralgias. Negative for joint swelling and gait problem.  Psychiatric/Behavioral: Positive for sleep disturbance. Negative for suicidal ideas. The patient is nervous/anxious and is hyperactive.    Past Medical History  Diagnosis Date  . GERD (gastroesophageal reflux disease)   . OP (osteoporosis)   . Migraine   . Acne   . Insomnia   . Depression   . Colon polyps   . Diarrhea     C. Diff    Social History   Social History  . Marital Status: Married    Spouse Name: N/A  . Number of Children: N/A  . Years of Education: N/A   Occupational History  . Not on file.   Social History Main Topics  . Smoking status: Former Games developermoker  . Smokeless tobacco: Not on file  . Alcohol Use: 0.0 oz/week    0 Standard drinks or equivalent per week     Comment: occ  . Drug Use: Not on file  .  Sexual Activity: Not on file   Other Topics Concern  . 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    Widowed    No kids   Likes to garden and read.          Past Surgical History  Procedure Laterality Date  . Abdominal hysterectomy    . Colonoscopy w/ polypectomy    . Tonsillectomy and adenoidectomy      Family History  Problem Relation Age of Onset  . Stroke Mother   . Arthritis Mother   . Diabetes Mother   . Heart disease Mother   . Hypertension Mother   . Hyperlipidemia Father   . Glaucoma Father   . Thyroid disease Sister   . Breast cancer Maternal Aunt   . Colon cancer Maternal Grandfather   . Depression Sister     Allergies  Allergen Reactions  . Codeine Phosphate     REACTION: nausea  . Penicillins     REACTION: rash  . Promethazine Hcl     REACTION: CNS S.E.    Current Outpatient Prescriptions on File Prior to Visit  Medication Sig Dispense Refill  . conjugated estrogens (PREMARIN) vaginal cream Place 0.5 Applicatorfuls vaginally daily. 42.5 g 11  . pantoprazole (  PROTONIX) 40 MG tablet Take 1 tablet (40 mg total) by mouth daily. 90 tablet 3  . predniSONE (DELTASONE) 20 MG tablet Take 1 tablet (20 mg total) by mouth daily with breakfast. 14 tablet 0  . tiZANidine (ZANAFLEX) 4 MG tablet Take 1 tablet (4 mg total) by mouth every 6 (six) hours as needed for muscle spasms. 90 tablet 3  . venlafaxine XR (EFFEXOR-XR) 150 MG 24 hr capsule Take 1 capsule (150 mg total) by mouth daily with breakfast. 90 capsule 3  . dicyclomine (BENTYL) 20 MG tablet Take 1 tablet (20 mg total) by mouth 3 (three) times daily before meals. (Patient not taking: Reported on 12/18/2015) 90 tablet 1   No current facility-administered medications on file prior to visit.    BP 120/52 mmHg  Temp(Src) 98.1 F (36.7 C) (Oral)  Ht  (1.549 m)  Wt 128 lb 11.2 oz (58.378 kg)  BMI 24.33 kg/m2       Objective:    Physical Exam  Constitutional: She is oriented to person, place, and time. She appears well-developed and well-nourished. No distress.  Cardiovascular: Normal rate, regular rhythm, normal heart sounds and intact distal pulses.  Exam reveals no gallop and no friction rub.   No murmur heard. Pulmonary/Chest: Effort normal and breath sounds normal. No respiratory distress. She has no wheezes. She has no rales. She exhibits no tenderness.  Neurological: She is alert and oriented to person, place, and time. She has normal reflexes. She displays normal reflexes. No cranial nerve deficit. She exhibits normal muscle tone. Coordination normal.  Skin: Skin is warm and dry. No rash noted. She is not diaphoretic. No erythema. No pallor.  Psychiatric: She has a normal mood and affect. Her behavior is normal. Judgment and thought content normal.  Nursing note and vitals reviewed.     Assessment & Plan:  1. Medication reaction, initial encounter - Stop taking prednisone  - if carpal tunnel pain returns, will send to sports medicine  - Follow up as needed   2. Midline low back pain with left-sided sciatica - AMB referral to orthopedics - Follow up as needed   Shirline Frees, NP

## 2015-12-25 ENCOUNTER — Telehealth: Payer: Self-pay | Admitting: Adult Health

## 2015-12-25 NOTE — Telephone Encounter (Signed)
[facility] - Client TELEPHONE ADVICE RECORD [facility]  Patient Name: Tonya Pope  DOB: 07/09/1951    Initial Comment Caller states, she is having a reaction to prednisone, she still has panic attacks after taking a different median RX, panic attack.    Nurse Assessment  Nurse: Dorthula RuePatten, RN, Enrique SackKendra Date/Time (Eastern Time): 12/25/2015 2:54:29 PM  Confirm and document reason for call. If symptomatic, describe symptoms. You must click the next button to save text entered. ---Caller states she was the doctor yesterday and she was fine yesterday. She states she took the Effexor 50mg , which has been on for a few years. She is having anxiety today and now thinks the Effexor is causing it. She has not had steroids in 30 hours.  Has the patient traveled out of the country within the last 30 days? ---Not Applicable  Does the patient have any new or worsening symptoms? ---Yes  Will a triage be completed? ---Yes  Related visit to physician within the last 2 weeks? ---Yes  Does the PT have any chronic conditions? (i.e. diabetes, asthma, etc.) ---Yes  List chronic conditions. ---Arthritis  Is this a behavioral health or substance abuse call? ---No     Guidelines    Guideline Title Affirmed Question Affirmed Notes  Anxiety and Panic Attack Patient sounds very upset or troubled to the triager    Final Disposition User   See Physician within 24 Hours ParamountPatten, RN, Enrique SackKendra    Comments  [facility]   Referrals  REFERRED TO PCP OFFICE   Disagree/Comply: Comply

## 2015-12-25 NOTE — Telephone Encounter (Signed)
Discussed with Kandee KeenCory and aware pt to be seen at [facility].  Permission to close the note.

## 2015-12-26 ENCOUNTER — Ambulatory Visit (INDEPENDENT_AMBULATORY_CARE_PROVIDER_SITE_OTHER): Payer: PPO | Admitting: Family Medicine

## 2015-12-26 ENCOUNTER — Encounter: Payer: Self-pay | Admitting: Family Medicine

## 2015-12-26 VITALS — BP 140/80 | HR 85 | Temp 98.5°F | Resp 20 | Ht 61.0 in | Wt 128.8 lb

## 2015-12-26 DIAGNOSIS — F410 Panic disorder [episodic paroxysmal anxiety] without agoraphobia: Secondary | ICD-10-CM

## 2015-12-26 DIAGNOSIS — T50905A Adverse effect of unspecified drugs, medicaments and biological substances, initial encounter: Secondary | ICD-10-CM | POA: Insufficient documentation

## 2015-12-26 DIAGNOSIS — T50905D Adverse effect of unspecified drugs, medicaments and biological substances, subsequent encounter: Secondary | ICD-10-CM

## 2015-12-26 DIAGNOSIS — T887XXD Unspecified adverse effect of drug or medicament, subsequent encounter: Secondary | ICD-10-CM | POA: Diagnosis not present

## 2015-12-26 HISTORY — DX: Adverse effect of unspecified drugs, medicaments and biological substances, initial encounter: T50.905A

## 2015-12-26 MED ORDER — ALPRAZOLAM 0.25 MG PO TABS: 0.2500 mg | ORAL_TABLET | Freq: Two times a day (BID) | ORAL | Status: DC | PRN

## 2015-12-26 NOTE — Progress Notes (Signed)
Subjective:    Patient ID: Tonya Pope, female    DOB: 02/19/1951, 65 y.o.   MRN: [id number]  HPI   65 year old female patient of Dr. Evelene CroonNafziger with history of panic attacks presents with complaints of anxiety and possible medication reaction.  At recent AMW on 12/18/2015 .Marland Kitchen. She reported to her PCP that panic attacks were well controlled ( Had not had panic attacks in 1-2 years). She is currently on effexor 150 mg daily, has been on this dose for 2 months ( was previously on 187.5) At that time she was started on pred for  B carpal tunnel.  She was seen on 12/24/2015 by her PCP with low back pain and medication reaction to prednisone.  Since she started prednisone for bilateral carpal tunnel pain.. She had felt shaky, nervous, anxious and unable to sleep. She tried half a dose but could not tolerate it.  PCP instructed her to stop prednisone on 12/24/2015.  Since then she started to feel better until yesterday.. She took her effexor and protonix. 30 min after that she felt very jittery and nervous again. Felt that way all day. Took a  65 year old Valium last night of husband's that did not help. She continues to feel anxious to day but not as badly.  No fever     Social History /Family History/Past Medical History reviewed and updated if needed.   Review of Systems  Constitutional: Negative for fever and fatigue.  HENT: Negative for ear pain.   Eyes: Negative for pain.  Respiratory: Negative for shortness of breath.   Cardiovascular: Negative for chest pain.       Objective:   Physical Exam  Constitutional: Vital signs are normal. She appears well-developed and well-nourished. She is cooperative.  Non-toxic appearance. She does not appear ill. No distress.  HENT:  Head: Normocephalic.  Right Ear: Hearing, tympanic membrane, external ear and ear canal normal. Tympanic membrane is not erythematous, not retracted and not bulging.  Left Ear: Hearing, tympanic membrane, external  ear and ear canal normal. Tympanic membrane is not erythematous, not retracted and not bulging.  Nose: No mucosal edema or rhinorrhea. Right sinus exhibits no maxillary sinus tenderness and no frontal sinus tenderness. Left sinus exhibits no maxillary sinus tenderness and no frontal sinus tenderness.  Mouth/Throat: Uvula is midline, oropharynx is clear and moist and mucous membranes are normal.  Eyes: Conjunctivae, EOM and lids are normal. Pupils are equal, round, and reactive to light. Lids are everted and swept, no foreign bodies found.  Neck: Trachea normal and normal range of motion. Neck supple. Carotid bruit is not present. No thyroid mass and no thyromegaly present.  Cardiovascular: Normal rate, regular rhythm, S1 normal, S2 normal, normal heart sounds, intact distal pulses and normal pulses.  Exam reveals no gallop and no friction rub.   No murmur heard. Pulmonary/Chest: Effort normal and breath sounds normal. No tachypnea. No respiratory distress. She has no decreased breath sounds. She has no wheezes. She has no rhonchi. She has no rales.  Abdominal: Soft. Normal appearance and bowel sounds are normal. There is no tenderness.  Neurological: She is alert.  Skin: Skin is warm, dry and intact. No rash noted.  Psychiatric: Her speech is normal. Judgment and thought content normal. Her mood appears anxious. She is agitated. Cognition and memory are normal. She does not exhibit a depressed mood. She expresses no suicidal plans and no homicidal plans.  Assessment & Plan:

## 2015-12-26 NOTE — Progress Notes (Signed)
Pre visit review using our clinic review tool, if applicable. No additional management support is needed unless otherwise documented below in the visit note. 

## 2015-12-26 NOTE — Assessment & Plan Note (Signed)
Continued reaction to prednisone likely. Doubt issue with long term effexor.  Given more time to get out of system. Push fluids, get exercise, return to normal activities as able.  Can use alprazolam as needed for panic attacks. Follow up with PCP if not improving as expected.

## 2015-12-26 NOTE — Assessment & Plan Note (Signed)
Prednisone has seemed to retrigger her anxiety issue. Pt reassured and given tempoary medcition to use prn panic.

## 2015-12-26 NOTE — Patient Instructions (Signed)
Can use ibuprofen for headache.  Continue effexor.  Can use alprazolam for panic attacks.  Given prednisone more time to get out of system.  Try to go to pool, drink water.  [facility] if not improving as exepected in next week.

## 2015-12-28 ENCOUNTER — Telehealth: Payer: Self-pay | Admitting: Adult Health

## 2015-12-28 NOTE — Telephone Encounter (Signed)
Pt would like a referral to someone who can manage her antipressants

## 2015-12-30 ENCOUNTER — Encounter: Payer: Self-pay | Admitting: Adult Health

## 2015-12-31 NOTE — Telephone Encounter (Signed)
See below & advise 

## 2016-01-03 NOTE — Telephone Encounter (Signed)
Any psychiatrist would be able to help her manage her anti depressant medications. We can send her a copy of our list or she can look for one.

## 2016-01-04 NOTE — Telephone Encounter (Signed)
Spoke to patient and gave her the numbers to [facility]. Pt will call the offices and decide from there.

## 2016-01-25 ENCOUNTER — Other Ambulatory Visit: Payer: Self-pay | Admitting: Adult Health

## 2016-03-23 ENCOUNTER — Other Ambulatory Visit: Payer: Self-pay | Admitting: Adult Health

## 2016-03-23 NOTE — Telephone Encounter (Signed)
Ok to refill for 90 +1 

## 2016-06-01 ENCOUNTER — Other Ambulatory Visit: Payer: Self-pay | Admitting: Adult Health

## 2016-06-01 NOTE — Telephone Encounter (Signed)
Ok to refill for 90 +1 

## 2016-06-01 NOTE — Telephone Encounter (Signed)
Ok to refill 

## 2016-07-13 ENCOUNTER — Other Ambulatory Visit: Payer: Self-pay | Admitting: Emergency Medicine

## 2016-07-28 DIAGNOSIS — M4722 Other spondylosis with radiculopathy, cervical region: Secondary | ICD-10-CM | POA: Diagnosis not present

## 2016-07-28 DIAGNOSIS — R200 Anesthesia of skin: Secondary | ICD-10-CM | POA: Diagnosis not present

## 2016-07-28 DIAGNOSIS — Z6825 Body mass index (BMI) 25.0-25.9, adult: Secondary | ICD-10-CM | POA: Diagnosis not present

## 2016-07-28 DIAGNOSIS — R202 Paresthesia of skin: Secondary | ICD-10-CM | POA: Diagnosis not present

## 2016-08-16 DIAGNOSIS — M9981 Other biomechanical lesions of cervical region: Secondary | ICD-10-CM | POA: Diagnosis not present

## 2016-08-16 DIAGNOSIS — G5603 Carpal tunnel syndrome, bilateral upper limbs: Secondary | ICD-10-CM | POA: Diagnosis not present

## 2016-08-18 HISTORY — PX: CARPAL TUNNEL RELEASE: SHX101

## 2016-08-19 DIAGNOSIS — M4722 Other spondylosis with radiculopathy, cervical region: Secondary | ICD-10-CM | POA: Diagnosis not present

## 2016-08-19 DIAGNOSIS — Z6825 Body mass index (BMI) 25.0-25.9, adult: Secondary | ICD-10-CM | POA: Diagnosis not present

## 2016-08-19 DIAGNOSIS — G5603 Carpal tunnel syndrome, bilateral upper limbs: Secondary | ICD-10-CM | POA: Diagnosis not present

## 2016-08-19 DIAGNOSIS — R030 Elevated blood-pressure reading, without diagnosis of hypertension: Secondary | ICD-10-CM | POA: Diagnosis not present

## 2016-08-22 ENCOUNTER — Encounter: Payer: Self-pay | Admitting: Gastroenterology

## 2016-08-24 DIAGNOSIS — G5601 Carpal tunnel syndrome, right upper limb: Secondary | ICD-10-CM | POA: Diagnosis not present

## 2016-09-16 ENCOUNTER — Other Ambulatory Visit: Payer: Self-pay | Admitting: Adult Health

## 2016-09-16 DIAGNOSIS — Z760 Encounter for issue of repeat prescription: Secondary | ICD-10-CM

## 2016-09-16 DIAGNOSIS — K2100 Gastro-esophageal reflux disease with esophagitis, without bleeding: Secondary | ICD-10-CM

## 2016-09-20 ENCOUNTER — Other Ambulatory Visit: Payer: Self-pay | Admitting: Adult Health

## 2016-10-11 DIAGNOSIS — M542 Cervicalgia: Secondary | ICD-10-CM | POA: Diagnosis not present

## 2016-10-21 DIAGNOSIS — M5126 Other intervertebral disc displacement, lumbar region: Secondary | ICD-10-CM | POA: Diagnosis not present

## 2016-10-31 ENCOUNTER — Other Ambulatory Visit: Payer: Self-pay | Admitting: Orthopedic Surgery

## 2016-10-31 DIAGNOSIS — M19042 Primary osteoarthritis, left hand: Secondary | ICD-10-CM | POA: Diagnosis not present

## 2016-10-31 DIAGNOSIS — M5023 Other cervical disc displacement, cervicothoracic region: Secondary | ICD-10-CM | POA: Diagnosis not present

## 2016-10-31 DIAGNOSIS — G5603 Carpal tunnel syndrome, bilateral upper limbs: Secondary | ICD-10-CM | POA: Diagnosis not present

## 2016-10-31 DIAGNOSIS — G5602 Carpal tunnel syndrome, left upper limb: Secondary | ICD-10-CM

## 2016-10-31 DIAGNOSIS — M542 Cervicalgia: Secondary | ICD-10-CM | POA: Diagnosis not present

## 2016-10-31 DIAGNOSIS — M1812 Unilateral primary osteoarthritis of first carpometacarpal joint, left hand: Secondary | ICD-10-CM

## 2016-10-31 DIAGNOSIS — M47812 Spondylosis without myelopathy or radiculopathy, cervical region: Secondary | ICD-10-CM

## 2016-10-31 DIAGNOSIS — M4722 Other spondylosis with radiculopathy, cervical region: Secondary | ICD-10-CM | POA: Diagnosis not present

## 2016-10-31 HISTORY — DX: Spondylosis without myelopathy or radiculopathy, cervical region: M47.812

## 2016-10-31 HISTORY — DX: Unilateral primary osteoarthritis of first carpometacarpal joint, left hand: M18.12

## 2016-10-31 HISTORY — DX: Carpal tunnel syndrome, left upper limb: G56.02

## 2016-11-03 ENCOUNTER — Encounter: Payer: Self-pay | Admitting: Adult Health

## 2016-11-03 ENCOUNTER — Ambulatory Visit (INDEPENDENT_AMBULATORY_CARE_PROVIDER_SITE_OTHER): Payer: Medicare Other | Admitting: Adult Health

## 2016-11-03 VITALS — BP 138/62 | Temp 98.4°F | Ht 61.0 in | Wt 132.0 lb

## 2016-11-03 DIAGNOSIS — M1611 Unilateral primary osteoarthritis, right hip: Secondary | ICD-10-CM | POA: Diagnosis not present

## 2016-11-03 DIAGNOSIS — M25552 Pain in left hip: Secondary | ICD-10-CM | POA: Diagnosis not present

## 2016-11-03 DIAGNOSIS — R197 Diarrhea, unspecified: Secondary | ICD-10-CM

## 2016-11-03 NOTE — Progress Notes (Signed)
Subjective:    Patient ID: Tonya Pope, female    DOB: Nov 06, 1950, 66 y.o.   MRN: [id number]  HPI  66 year old female who  has a past medical history of Acne; Colon polyps; Depression; Diarrhea; GERD (gastroesophageal reflux disease); Insomnia; Migraine; and OP (osteoporosis). She presents to the office today for the acute complaint of diarrhea, she reports that she has had episodes of diarrhea- she believes that this is the cause of salmonella tainted eggs. Her symptoms have been presents for three days. Diarrhea is present after eating.   Denies any vomiting, or abdominal pain. She has not had a fever    Review of Systems See HPI   Past Medical History:  Diagnosis Date  . Acne   . Colon polyps   . Depression   . Diarrhea    C. Diff  . GERD (gastroesophageal reflux disease)   . Insomnia   . Migraine   . OP (osteoporosis)     Social History   Social History  . Marital status: Married    Spouse name: N/A  . Number of children: N/A  . Years of education: N/A   Occupational History  . Not on file.   Social History Main Topics  . Smoking status: Former Games developer  . Smokeless tobacco: Never Used  . Alcohol use 0.0 oz/week     Comment: occ  . Drug use: Unknown  . Sexual activity: Not on file   Other Topics Concern  . 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    Widowed    No kids   Likes to garden and read.          Past Surgical History:  Procedure Laterality Date  . ABDOMINAL HYSTERECTOMY    . COLONOSCOPY W/ POLYPECTOMY    . TONSILLECTOMY AND ADENOIDECTOMY      Family History  Problem Relation Age of Onset  . Stroke Mother   . Arthritis Mother   . Diabetes Mother   . Heart disease Mother   . Hypertension Mother   . Hyperlipidemia Father   . Glaucoma Father   . Thyroid disease Sister   . Breast cancer Maternal Aunt   . Colon cancer Maternal Grandfather   . Depression Sister      Allergies  Allergen Reactions  . Codeine Phosphate     REACTION: nausea  . Penicillins     REACTION: rash  . Promethazine Hcl     REACTION: CNS S.E.    Current Outpatient Prescriptions on File Prior to Visit  Medication Sig Dispense Refill  . dicyclomine (BENTYL) 20 MG tablet Take 1 tablet (20 mg total) by mouth 3 (three) times daily before meals. 90 tablet 1  . pantoprazole (PROTONIX) 40 MG tablet TAKE 1 TABLET(40 MG) BY MOUTH DAILY 90 tablet 1  . tiZANidine (ZANAFLEX) 4 MG tablet Take 1 tablet (4 mg total) by mouth every 6 (six) hours as needed for muscle spasms. 90 tablet 3  . venlafaxine XR (EFFEXOR-XR) 150 MG 24 hr capsule TAKE 1 CAPSULE BY MOUTH DAILY WITH BREAKFAST 90 capsule 1   No current facility-administered medications on file prior to visit.     BP 138/62 (BP Location: Left Arm, Patient Position: Sitting, Cuff Size: Normal)   Temp 98.4 F (36.9 C) (Oral)   Ht  (1.549 m)   Wt 132 lb (59.9 kg)   BMI 24.94 kg/m  Objective:   Physical Exam  Constitutional: She is oriented to person, place, and time. She appears well-developed and well-nourished. No distress.  Cardiovascular: Normal rate, regular rhythm, normal heart sounds and intact distal pulses.  Exam reveals no gallop and no friction rub.   No murmur heard. Pulmonary/Chest: Effort normal and breath sounds normal. No respiratory distress. She has no wheezes. She has no rales. She exhibits no tenderness.  Abdominal: Soft. Bowel sounds are normal. She exhibits no distension and no mass. There is no hepatosplenomegaly, splenomegaly or hepatomegaly. There is generalized tenderness. There is no rigidity, no rebound, no CVA tenderness, no tenderness at McBurney's point and negative Murphy's sign.  Musculoskeletal: Normal range of motion. She exhibits no edema, tenderness or deformity.  Neurological: She is alert and oriented to person, place, and time.  Skin: Skin is warm and dry. No rash noted. She is not  diaphoretic. No erythema. No pallor.  Psychiatric: She has a normal mood and affect. Her behavior is normal. Judgment and thought content normal.  Nursing note and vitals reviewed.     Assessment & Plan:  1. Diarrhea, unspecified type - Unknown viral gastroenteritis or salmonella related. Advised watchful waiting. Switch from imodium to pepto  - Stay hydrated and eat a bland diet.  - Should pass in 7-10 days - Return precautions given   Shirline Frees, NP

## 2016-11-03 NOTE — Progress Notes (Signed)
WE NOW OFFER   Tonya Pope's FAST TRACK!!!  SAME DAY Appointments for ACUTE CARE  Such as: Sprains, Injuries, cuts, abrasions, rashes, muscle pain, joint pain, back pain Colds, flu, sore throats, headache, allergies, cough, fever  Ear pain, sinus and eye infections Abdominal pain, nausea, vomiting, diarrhea, upset stomach Animal/insect bites  3 Easy Ways to Schedule: Walk-In Scheduling Call in scheduling Mychart Sign-up: [email or web address]         

## 2016-11-05 DIAGNOSIS — M545 Low back pain: Secondary | ICD-10-CM | POA: Diagnosis not present

## 2016-11-09 DIAGNOSIS — M79605 Pain in left leg: Secondary | ICD-10-CM | POA: Diagnosis not present

## 2016-11-09 DIAGNOSIS — M545 Low back pain: Secondary | ICD-10-CM | POA: Diagnosis not present

## 2016-11-09 DIAGNOSIS — M542 Cervicalgia: Secondary | ICD-10-CM | POA: Diagnosis not present

## 2016-11-09 DIAGNOSIS — M6281 Muscle weakness (generalized): Secondary | ICD-10-CM | POA: Diagnosis not present

## 2016-11-10 ENCOUNTER — Encounter (HOSPITAL_BASED_OUTPATIENT_CLINIC_OR_DEPARTMENT_OTHER): Payer: Self-pay | Admitting: *Deleted

## 2016-11-14 ENCOUNTER — Telehealth: Payer: Self-pay | Admitting: Family Medicine

## 2016-11-14 ENCOUNTER — Encounter: Payer: Self-pay | Admitting: Family Medicine

## 2016-11-14 ENCOUNTER — Ambulatory Visit (INDEPENDENT_AMBULATORY_CARE_PROVIDER_SITE_OTHER): Payer: Medicare Other | Admitting: Family Medicine

## 2016-11-14 VITALS — BP 124/82 | HR 70 | Temp 98.6°F | Wt 129.2 lb

## 2016-11-14 DIAGNOSIS — M6281 Muscle weakness (generalized): Secondary | ICD-10-CM | POA: Diagnosis not present

## 2016-11-14 DIAGNOSIS — R197 Diarrhea, unspecified: Secondary | ICD-10-CM | POA: Diagnosis not present

## 2016-11-14 DIAGNOSIS — M542 Cervicalgia: Secondary | ICD-10-CM | POA: Diagnosis not present

## 2016-11-14 DIAGNOSIS — M79605 Pain in left leg: Secondary | ICD-10-CM | POA: Diagnosis not present

## 2016-11-14 DIAGNOSIS — M545 Low back pain: Secondary | ICD-10-CM | POA: Diagnosis not present

## 2016-11-14 NOTE — Telephone Encounter (Signed)
Spoke to the pt.  She seen Cory on 11/03/16 for Diarrhea.  Today she states that her stool has turned really dark.  She is now having some abdominal pain.  Does not believe that she is running a fever.  Diarrhea never went completely away.  Has come and gone over the past several days.  Pt advised she should be seen due to new/worsening sx.  Scheduled with Raynelle Fanning today at 1:45 PM.  Will forward.

## 2016-11-14 NOTE — Progress Notes (Signed)
Subjective:    Patient ID: Tonya Pope, female    DOB: 12-24-1950, 66 y.o.   MRN: [id number]  HPI  Tonya Pope is a 66 year old female who presents today with diarrhea that has occurs "off and on" for the past 2 weeks.  She reports that diarrhea has worsened for 4 days. She reports 4 episodes of watery diarrhea each day for 4 days, however no episodes of loose stools today. Treatment with Pepto Bismol and then she switched to immodium last night. She reports no episodes of loose stools today after taking immodium last night. She denies fever, chills, sweats, N/V, abdominal pain, cramping, or myalgias.  She reports that this may be associated with eating. She reports drinking fluid and eating small amounts of food such as spoonfuls of peanut butter over the weekend. She denies dark urine or decrease in urination. She reports eating eggs that may have been tainted with Salmonella. She denies bloody stool or mucous in stools. No recent antibiotic therapy.  Approximately 4 months ago she started diclofenac however diarrhea started 2 weeks ago.  Review of Systems  Constitutional: Negative for chills, fatigue and fever.  Respiratory: Negative for cough, shortness of breath and wheezing.   Cardiovascular: Negative for chest pain and palpitations.  Gastrointestinal: Positive for diarrhea. Negative for abdominal distention, abdominal pain, blood in stool, nausea and vomiting.  Genitourinary: Negative for dysuria, hematuria and urgency.  Skin: Negative for rash.  Neurological: Negative for dizziness, light-headedness and headaches.   Past Medical History:  Diagnosis Date  . Acne   . Colon polyps   . Depression   . Diarrhea    C. Diff  . GERD (gastroesophageal reflux disease)   . Insomnia   . Migraine   . OP (osteoporosis)      Social History   Social History  . Marital status: Married    Spouse name: N/A  . Number of children: N/A  . Years of education: N/A   Occupational  History  . Not on file.   Social History Main Topics  . Smoking status: Former Games developer  . Smokeless tobacco: Never Used  . Alcohol use 0.0 oz/week     Comment: occ  . Drug use: No  . Sexual activity: Not on file   Other Topics Concern  . 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    Widowed    No kids   Likes to garden and read.          Past Surgical History:  Procedure Laterality Date  . ABDOMINAL HYSTERECTOMY    . COLONOSCOPY W/ POLYPECTOMY    . TONSILLECTOMY AND ADENOIDECTOMY      Family History  Problem Relation Age of Onset  . Stroke Mother   . Arthritis Mother   . Diabetes Mother   . Heart disease Mother   . Hypertension Mother   . Hyperlipidemia Father   . Glaucoma Father   . Thyroid disease Sister   . Breast cancer Maternal Aunt   . Colon cancer Maternal Grandfather   . Depression Sister     Allergies  Allergen Reactions  . Codeine Phosphate     REACTION: nausea  . Penicillins     REACTION: rash  . Promethazine Hcl     REACTION: CNS S.E.    Current Outpatient Prescriptions on File Prior to Visit  Medication Sig Dispense Refill  . aspirin  EC 81 MG tablet Take 81 mg by mouth every other day.    . baclofen (LIORESAL) 20 MG tablet Take 20 mg by mouth 2 (two) times daily.    Marland Kitchen HYDROcodone-acetaminophen (NORCO/VICODIN) 5-325 MG tablet Take 1 tablet by mouth as needed for moderate pain.    . pantoprazole (PROTONIX) 40 MG tablet TAKE 1 TABLET(40 MG) BY MOUTH DAILY 90 tablet 1  . tiZANidine (ZANAFLEX) 4 MG tablet Take 1 tablet (4 mg total) by mouth every 6 (six) hours as needed for muscle spasms. 90 tablet 3  . venlafaxine XR (EFFEXOR-XR) 150 MG 24 hr capsule TAKE 1 CAPSULE BY MOUTH DAILY WITH BREAKFAST 90 capsule 1   No current facility-administered medications on file prior to visit.     BP 124/82 (BP Location: Left Arm, Patient Position: Sitting, Cuff Size: Normal)   Pulse 70   Temp  98.6 F (37 C) (Oral)   Wt 129 lb 3.2 oz (58.6 kg)   SpO2 98%   BMI 24.41 kg/m        Objective:   Physical Exam  Constitutional: She is oriented to person, place, and time. She appears well-developed.  Eyes: Pupils are equal, round, and reactive to light. No scleral icterus.  Neck: Neck supple.  Cardiovascular: Normal rate, regular rhythm and intact distal pulses.   Pulmonary/Chest: Effort normal and breath sounds normal. She has no wheezes. She has no rales.  Abdominal: Soft. Bowel sounds are normal. There is no tenderness. There is no rigidity, no rebound, no guarding, no CVA tenderness, no tenderness at McBurney's point and negative Murphy's sign.  Musculoskeletal: She exhibits no edema.  Lymphadenopathy:    She has no cervical adenopathy.  Neurological: She is alert and oriented to person, place, and time.  Skin: Skin is warm and dry. No rash noted.  Psychiatric: She has a normal mood and affect. Her behavior is normal. Judgment and thought content normal.       Assessment & Plan:  1. Diarrhea, unspecified type No loose stools today after immodium; with report of no improvement with Pepto Bismol and continuing symptoms; will obtain a stool culture. She is also due for her colonoscopy. She reports receiving a letter about this and will call and schedule. Advised her to scheduled colonoscopy and return stool sample to lab for evaluation. Further advised her to follow up if symptoms worsen or she notices dark urine or decreased fluid intake. - Stool culture  Roddie Mc, FNP-C

## 2016-11-14 NOTE — Progress Notes (Signed)
Pre visit review using our clinic review tool, if applicable. No additional management support is needed unless otherwise documented below in the visit note. 

## 2016-11-14 NOTE — Patient Instructions (Addendum)
It was a pleasure to see you today. Please go to the lab regarding information for your culture. Also, continue immodium and continue fluids to avoid dehydration. Also, you should start increasing your diet slowly to evaluate if food is a trigger for this symtpom.   Bland Diet A bland diet consists of foods that do not have a lot of fat or fiber. Foods without fat or fiber are easier for the body to digest. They are also less likely to irritate your mouth, throat, stomach, and other parts of your gastrointestinal tract. A bland diet is sometimes called a BRAT diet. What is my plan? Your health care provider or dietitian may recommend specific changes to your diet to prevent and treat your symptoms, such as:  Eating small meals often.  Cooking food until it is soft enough to chew easily.  Chewing your food well.  Drinking fluids slowly.  Not eating foods that are very spicy, sour, or fatty.  Not eating citrus fruits, such as oranges and grapefruit. What do I need to know about this diet?  Eat a variety of foods from the bland diet food list.  Do not follow a bland diet longer than you have to.  Ask your health care provider whether you should take vitamins. What foods can I eat? Grains   Hot cereals, such as cream of wheat. Bread, crackers, or tortillas made from refined white flour. Rice. Vegetables  Canned or cooked vegetables. Mashed or boiled potatoes. Fruits  Bananas. Applesauce. Other types of cooked or canned fruit with the skin and seeds removed, such as canned peaches or pears. Meats and Other Protein Sources  Scrambled eggs. Creamy peanut butter or other nut butters. Lean, well-cooked meats, such as chicken or fish. Tofu. Soups or broths. Dairy  Low-fat dairy products, such as milk, cottage cheese, or yogurt. Beverages  Water. Herbal tea. Apple juice. Sweets and Desserts  Pudding. Custard. Fruit gelatin. Ice cream. Fats and Oils  Mild salad dressings. Canola or  olive oil. The items listed above may not be a complete list of allowed foods or beverages. Contact your dietitian for more options.  What foods are not recommended? Foods and ingredients that are often not recommended include:  Spicy foods, such as hot sauce or salsa.  Fried foods.  Sour foods, such as pickled or fermented foods.  Raw vegetables or fruits, especially citrus or berries.  Caffeinated drinks.  Alcohol.  Strongly flavored seasonings or condiments. The items listed above may not be a complete list of foods and beverages that are not allowed. Contact your dietitian for more information.  This information is not intended to replace advice given to you by your health care provider. Make sure you discuss any questions you have with your health care provider. Document Released: 10/26/2015 Document Revised: 12/10/2015 Document Reviewed: 07/16/2014 Elsevier Interactive Patient Education  2017 Elsevier Inc.   WE NOW OFFER   [facility] FAST TRACK!!!  SAME DAY Appointments for ACUTE CARE  Such as: Sprains, Injuries, cuts, abrasions, rashes, muscle pain, joint pain, back pain Colds, flu, sore throats, headache, allergies, cough, fever  Ear pain, sinus and eye infections Abdominal pain, nausea, vomiting, diarrhea, upset stomach Animal/insect bites  3 Easy Ways to Schedule: Walk-In Scheduling Call in scheduling Mychart Sign-up: [email or web address]

## 2016-11-15 ENCOUNTER — Encounter (HOSPITAL_BASED_OUTPATIENT_CLINIC_OR_DEPARTMENT_OTHER)
Admission: RE | Disposition: A | Discharge status: Home / Self Care | Payer: Self-pay | Source: Ambulatory Visit | Attending: Orthopedic Surgery

## 2016-11-15 ENCOUNTER — Ambulatory Visit (HOSPITAL_BASED_OUTPATIENT_CLINIC_OR_DEPARTMENT_OTHER): Payer: Medicare Other | Admitting: Anesthesiology

## 2016-11-15 ENCOUNTER — Ambulatory Visit (HOSPITAL_BASED_OUTPATIENT_CLINIC_OR_DEPARTMENT_OTHER)
Admission: RE | Admit: 2016-11-15 | Discharge: 2016-11-15 | Disposition: A | Discharge status: Home / Self Care | Payer: Medicare Other | Source: Ambulatory Visit | Attending: Orthopedic Surgery | Admitting: Orthopedic Surgery

## 2016-11-15 ENCOUNTER — Encounter (HOSPITAL_BASED_OUTPATIENT_CLINIC_OR_DEPARTMENT_OTHER): Payer: Self-pay | Admitting: Anesthesiology

## 2016-11-15 DIAGNOSIS — G4700 Insomnia, unspecified: Secondary | ICD-10-CM | POA: Insufficient documentation

## 2016-11-15 DIAGNOSIS — Z800 Family history of malignant neoplasm of digestive organs: Secondary | ICD-10-CM | POA: Insufficient documentation

## 2016-11-15 DIAGNOSIS — M47812 Spondylosis without myelopathy or radiculopathy, cervical region: Secondary | ICD-10-CM | POA: Diagnosis not present

## 2016-11-15 DIAGNOSIS — Z87891 Personal history of nicotine dependence: Secondary | ICD-10-CM | POA: Diagnosis not present

## 2016-11-15 DIAGNOSIS — Z885 Allergy status to narcotic agent status: Secondary | ICD-10-CM | POA: Diagnosis not present

## 2016-11-15 DIAGNOSIS — Z823 Family history of stroke: Secondary | ICD-10-CM | POA: Insufficient documentation

## 2016-11-15 DIAGNOSIS — F329 Major depressive disorder, single episode, unspecified: Secondary | ICD-10-CM | POA: Insufficient documentation

## 2016-11-15 DIAGNOSIS — Z8249 Family history of ischemic heart disease and other diseases of the circulatory system: Secondary | ICD-10-CM | POA: Diagnosis not present

## 2016-11-15 DIAGNOSIS — Z83511 Family history of glaucoma: Secondary | ICD-10-CM | POA: Insufficient documentation

## 2016-11-15 DIAGNOSIS — Z8261 Family history of arthritis: Secondary | ICD-10-CM | POA: Diagnosis not present

## 2016-11-15 DIAGNOSIS — Z803 Family history of malignant neoplasm of breast: Secondary | ICD-10-CM | POA: Insufficient documentation

## 2016-11-15 DIAGNOSIS — M1990 Unspecified osteoarthritis, unspecified site: Secondary | ICD-10-CM | POA: Diagnosis not present

## 2016-11-15 DIAGNOSIS — Z880 Allergy status to penicillin: Secondary | ICD-10-CM | POA: Insufficient documentation

## 2016-11-15 DIAGNOSIS — Z86010 Personal history of colonic polyps: Secondary | ICD-10-CM | POA: Insufficient documentation

## 2016-11-15 DIAGNOSIS — G5603 Carpal tunnel syndrome, bilateral upper limbs: Secondary | ICD-10-CM | POA: Insufficient documentation

## 2016-11-15 DIAGNOSIS — Z8349 Family history of other endocrine, nutritional and metabolic diseases: Secondary | ICD-10-CM | POA: Diagnosis not present

## 2016-11-15 DIAGNOSIS — Z833 Family history of diabetes mellitus: Secondary | ICD-10-CM | POA: Insufficient documentation

## 2016-11-15 DIAGNOSIS — Z888 Allergy status to other drugs, medicaments and biological substances status: Secondary | ICD-10-CM | POA: Diagnosis not present

## 2016-11-15 DIAGNOSIS — M810 Age-related osteoporosis without current pathological fracture: Secondary | ICD-10-CM | POA: Diagnosis not present

## 2016-11-15 DIAGNOSIS — Z818 Family history of other mental and behavioral disorders: Secondary | ICD-10-CM | POA: Diagnosis not present

## 2016-11-15 DIAGNOSIS — K219 Gastro-esophageal reflux disease without esophagitis: Secondary | ICD-10-CM | POA: Insufficient documentation

## 2016-11-15 DIAGNOSIS — G43909 Migraine, unspecified, not intractable, without status migrainosus: Secondary | ICD-10-CM | POA: Insufficient documentation

## 2016-11-15 DIAGNOSIS — G5602 Carpal tunnel syndrome, left upper limb: Secondary | ICD-10-CM | POA: Diagnosis not present

## 2016-11-15 DIAGNOSIS — Z90710 Acquired absence of both cervix and uterus: Secondary | ICD-10-CM | POA: Insufficient documentation

## 2016-11-15 DIAGNOSIS — I341 Nonrheumatic mitral (valve) prolapse: Secondary | ICD-10-CM | POA: Diagnosis not present

## 2016-11-15 DIAGNOSIS — Z8619 Personal history of other infectious and parasitic diseases: Secondary | ICD-10-CM | POA: Diagnosis not present

## 2016-11-15 DIAGNOSIS — R319 Hematuria, unspecified: Secondary | ICD-10-CM | POA: Diagnosis not present

## 2016-11-15 HISTORY — PX: CARPAL TUNNEL RELEASE: SHX101

## 2016-11-15 SURGERY — CARPAL TUNNEL RELEASE
Anesthesia: Monitor Anesthesia Care | Site: Wrist | Laterality: Left

## 2016-11-15 MED ORDER — FENTANYL CITRATE (PF) 100 MCG/2ML IJ SOLN
INTRAMUSCULAR | Status: AC
  Filled 2016-11-15: qty 2

## 2016-11-15 MED ORDER — HYDROMORPHONE HCL 1 MG/ML IJ SOLN: 0.2500 mg | INTRAMUSCULAR | Status: DC | PRN

## 2016-11-15 MED ORDER — MIDAZOLAM HCL 2 MG/2ML IJ SOLN
INTRAMUSCULAR | Status: AC
  Filled 2016-11-15: qty 2

## 2016-11-15 MED ORDER — FENTANYL CITRATE (PF) 100 MCG/2ML IJ SOLN
50.0000 ug | INTRAMUSCULAR | Status: DC | PRN
  Administered 2016-11-15 (×2): 50 ug via INTRAVENOUS

## 2016-11-15 MED ORDER — VANCOMYCIN HCL IN DEXTROSE 1-5 GM/200ML-% IV SOLN
1000.0000 mg | INTRAVENOUS | Status: AC
  Administered 2016-11-15: 1000 mg via INTRAVENOUS

## 2016-11-15 MED ORDER — BUPIVACAINE HCL (PF) 0.25 % IJ SOLN
INTRAMUSCULAR | Status: DC | PRN
  Administered 2016-11-15: 8 mL

## 2016-11-15 MED ORDER — PROPOFOL 500 MG/50ML IV EMUL
INTRAVENOUS | Status: DC | PRN
  Administered 2016-11-15: 25 ug/kg/min via INTRAVENOUS

## 2016-11-15 MED ORDER — SCOPOLAMINE 1 MG/3DAYS TD PT72: 1.0000 | MEDICATED_PATCH | Freq: Once | TRANSDERMAL | Status: DC | PRN

## 2016-11-15 MED ORDER — LIDOCAINE 2% (20 MG/ML) 5 ML SYRINGE
INTRAMUSCULAR | Status: AC
  Filled 2016-11-15: qty 5

## 2016-11-15 MED ORDER — CHLORHEXIDINE GLUCONATE 4 % EX LIQD: 60.0000 mL | Freq: Once | CUTANEOUS | Status: DC

## 2016-11-15 MED ORDER — ONDANSETRON HCL 4 MG/2ML IJ SOLN
INTRAMUSCULAR | Status: DC | PRN
  Administered 2016-11-15: 4 mg via INTRAVENOUS

## 2016-11-15 MED ORDER — LACTATED RINGERS IV SOLN
INTRAVENOUS | Status: DC
  Administered 2016-11-15 (×2): via INTRAVENOUS

## 2016-11-15 MED ORDER — MIDAZOLAM HCL 2 MG/2ML IJ SOLN
1.0000 mg | INTRAMUSCULAR | Status: DC | PRN
  Administered 2016-11-15: 2 mg via INTRAVENOUS

## 2016-11-15 MED ORDER — PROMETHAZINE HCL 25 MG/ML IJ SOLN: 6.2500 mg | INTRAMUSCULAR | Status: DC | PRN

## 2016-11-15 MED ORDER — VANCOMYCIN HCL IN DEXTROSE 1-5 GM/200ML-% IV SOLN
INTRAVENOUS | Status: AC
  Filled 2016-11-15: qty 200

## 2016-11-15 MED ORDER — HYDROCODONE-ACETAMINOPHEN 5-325 MG PO TABS: 1.0000 | ORAL_TABLET | Freq: Four times a day (QID) | ORAL | 0 refills | Status: DC | PRN

## 2016-11-15 MED ORDER — LIDOCAINE HCL (CARDIAC) 20 MG/ML IV SOLN
INTRAVENOUS | Status: DC | PRN
  Administered 2016-11-15: 30 mg via INTRAVENOUS

## 2016-11-15 MED ORDER — PROPOFOL 10 MG/ML IV BOLUS
INTRAVENOUS | Status: AC
  Filled 2016-11-15: qty 20

## 2016-11-15 SURGICAL SUPPLY — 36 items
BLADE SURG 15 STRL LF DISP TIS (BLADE) ×1 IMPLANT
BLADE SURG 15 STRL SS (BLADE) ×3
BNDG CMPR 9X4 STRL LF SNTH (GAUZE/BANDAGES/DRESSINGS)
BNDG COHESIVE 3X5 TAN STRL LF (GAUZE/BANDAGES/DRESSINGS) ×3 IMPLANT
BNDG ESMARK 4X9 LF (GAUZE/BANDAGES/DRESSINGS) IMPLANT
BNDG GAUZE ELAST 4 BULKY (GAUZE/BANDAGES/DRESSINGS) ×3 IMPLANT
CHLORAPREP W/TINT 26ML (MISCELLANEOUS) ×3 IMPLANT
CORDS BIPOLAR (ELECTRODE) ×3 IMPLANT
COVER BACK TABLE 60X90IN (DRAPES) ×3 IMPLANT
COVER MAYO STAND STRL (DRAPES) ×3 IMPLANT
CUFF TOURNIQUET SINGLE 18IN (TOURNIQUET CUFF) ×3 IMPLANT
DRAPE EXTREMITY T 121X128X90 (DRAPE) ×3 IMPLANT
DRAPE SURG 17X23 STRL (DRAPES) ×3 IMPLANT
DRSG PAD ABDOMINAL 8X10 ST (GAUZE/BANDAGES/DRESSINGS) ×3 IMPLANT
GAUZE SPONGE 4X4 12PLY STRL (GAUZE/BANDAGES/DRESSINGS) ×3 IMPLANT
GAUZE XEROFORM 1X8 LF (GAUZE/BANDAGES/DRESSINGS) ×3 IMPLANT
GLOVE BIO SURGEON STRL SZ 6.5 (GLOVE) ×4 IMPLANT
GLOVE BIO SURGEONS STRL SZ 6.5 (GLOVE) ×2
GLOVE BIOGEL PI IND STRL 7.0 (GLOVE) ×2 IMPLANT
GLOVE BIOGEL PI IND STRL 8.5 (GLOVE) ×1 IMPLANT
GLOVE BIOGEL PI INDICATOR 7.0 (GLOVE) ×4
GLOVE BIOGEL PI INDICATOR 8.5 (GLOVE) ×2
GLOVE SURG ORTHO 8.0 STRL STRW (GLOVE) ×3 IMPLANT
GOWN STRL REUS W/ TWL LRG LVL3 (GOWN DISPOSABLE) ×2 IMPLANT
GOWN STRL REUS W/TWL LRG LVL3 (GOWN DISPOSABLE) ×4
GOWN STRL REUS W/TWL XL LVL3 (GOWN DISPOSABLE) ×3 IMPLANT
NEEDLE PRECISIONGLIDE 27X1.5 (NEEDLE) ×3 IMPLANT
NS IRRIG 1000ML POUR BTL (IV SOLUTION) ×3 IMPLANT
PACK BASIN DAY SURGERY FS (CUSTOM PROCEDURE TRAY) ×3 IMPLANT
STOCKINETTE 4X48 STRL (DRAPES) ×3 IMPLANT
SUT ETHILON 4 0 PS 2 18 (SUTURE) ×3 IMPLANT
SUT VICRYL 4-0 PS2 18IN ABS (SUTURE) IMPLANT
SYR BULB 3OZ (MISCELLANEOUS) ×3 IMPLANT
SYR CONTROL 10ML LL (SYRINGE) ×3 IMPLANT
TOWEL OR 17X24 6PK STRL BLUE (TOWEL DISPOSABLE) ×3 IMPLANT
UNDERPAD 30X30 (UNDERPADS AND DIAPERS) ×3 IMPLANT
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
## 2016-11-15 NOTE — Anesthesia Procedure Notes (Signed)
Anesthesia Regional Block: Bier block (IV Regional)   Pre-Anesthetic Checklist: ,, timeout performed, Correct Patient, Correct Site, Correct Laterality, Correct Procedure,, site marked, surgical consent,, at surgeon's request Needles:  Injection technique: Single-shot  Needle Type: Other      Needle Gauge: 22     Additional Needles:   Procedures:,,,,,,, Esmarch exsanguination, single tourniquet utilized,  Narrative:  Start time: 11/15/2016 10:13 AM End time: 11/15/2016 10:15 AM Injection made incrementally with aspirations every 35 mL.  Performed by: Personally   Additional Notes: Esmark wrap, torq inflated to 250, neg pulse, slowly injected 0.5% pres free Lidocaine. Pt tol well, VSS.

## 2016-11-15 NOTE — Anesthesia Postprocedure Evaluation (Addendum)
Anesthesia Post Note  Patient: Tonya Pope  Procedure(s) Performed: Procedure(s) (LRB): LEFT CARPAL TUNNEL RELEASE (Left)  Patient location during evaluation: PACU Anesthesia Type: Regional Level of consciousness: awake and alert Pain management: pain level controlled Vital Signs Assessment: post-procedure vital signs reviewed and stable Respiratory status: spontaneous breathing, nonlabored ventilation, respiratory function stable and patient connected to nasal cannula oxygen Cardiovascular status: stable and blood pressure returned to baseline Anesthetic complications: no       Last Vitals:  Vitals:   11/15/16 1100 11/15/16 1114  BP: 116/75 (!) 141/78  Pulse: 79 93  Resp: 13 16  Temp:  36.9 C    Last Pain:  Vitals:   11/15/16 1114  TempSrc: Oral  PainSc: 0-No pain                 Siddalee Vanderheiden,JAMES TERRILL

## 2016-11-15 NOTE — Op Note (Signed)
Dictation Number [id number]

## 2016-11-15 NOTE — Discharge Instructions (Addendum)

## 2016-11-15 NOTE — Op Note (Signed)
NAMEKATELEE, SCHUPP              ACCOUNT NO.:  [id number]  MEDICAL RECORD NO.:  [id number]  LOCATION:                                 FACILITY:  PHYSICIAN:  Cindee Salt, M.D.            DATE OF BIRTH:  DATE OF PROCEDURE:  11/15/2016 DATE OF DISCHARGE:                              OPERATIVE REPORT   PREOPERATIVE DIAGNOSIS:  Carpal tunnel syndrome, left hand.  POSTOPERATIVE DIAGNOSIS:  Carpal tunnel syndrome, left hand.  OPERATION:  Decompression of left median nerve.  SURGEON:  Cindee Salt, MD.  ASSISTANT:  None.  ANESTHESIA:  Forearm-based IV regional anesthetic with local infiltration.  PLACE OF SURGERY:  [facility].  ANESTHESIOLOGIST:  Burna Forts, M.D.  HISTORY:  The patient is a 66 year old female with a history of carpal tunnel syndrome.  EMG nerve conduction is positive.  She has elected to undergo surgical decompression of the left median nerve.  Pre, peri, and postoperative course have been discussed along with risks and complications.  She is aware that there is no guarantee to the surgery; the possibility of infection; recurrence of injury to arteries, nerves, tendons; incomplete relief of symptoms; and dystrophy.  In the preoperative area, the patient was seen, the extremity marked by both patient and surgeon.  Antibiotic given.  PROCEDURE IN DETAIL:  The patient was brought to the operating room, where a forearm-based IV regional anesthetic was carried out without difficulty.  She was prepped using ChloraPrep in a supine position with the left arm free.  A 3-minute dry time was allowed, and a time-out taken, confirming the patient and procedure.  A longitudinal incision was made in the left palm, carried down through subcutaneous tissue. Bleeders were electrocauterized with bipolar.  The palmar fascia was identified, this was split.  The superficial palmar arch was identified along with the flexor tendon to the ring and little finger.   Retractors were placed protecting the median nerve flexor tendons radially and the ulnar nerve ulnarly.  Flexor retinaculum was then incised with sharp dissection in its ulnar border.  A right angle and Sewell retractor were placed between skin and forearm fascia.  The underlying nerve tendons were then dissected from the overlying proximal flexor retinaculum and distal forearm fascia.  The fascia and retinaculum were then released with blunt scissors for approximately a centimeter and a half to 2 cm proximal to the wrist crease under direct vision.  The canal was explored.  Motor branch entered into muscle distally.  An area of compression to the nerve was apparent.  No further lesions were identified.  The wound was copiously irrigated with saline.  The skin was then closed with interrupted 4-0 nylon sutures.  A local infiltration with 0.25% bupivacaine without epinephrine was given. Approximately 8 mL was used.  A sterile compressive dressing with the fingers free was applied.  On deflation of the tourniquet, all fingers immediately pinked.  She was taken to the recovery room for observation in satisfactory condition.  She will be discharged to home to return to the [facility] [place] in 1 week, on Norco.  ______________________________ Cindee Salt, M.D.     GK/MEDQ  D:  11/15/2016  T:  11/15/2016  Job:  [id number]

## 2016-11-15 NOTE — Brief Op Note (Signed)
11/15/2016  10:40 AM  PATIENT:  Tonya Pope  66 y.o. female  PRE-OPERATIVE DIAGNOSIS:  LEFT CARPAL TUNNEL SYNDROME  POST-OPERATIVE DIAGNOSIS:  LEFT CARPAL TUNNEL SYNDROME  PROCEDURE:  Procedure(s): LEFT CARPAL TUNNEL RELEASE (Left)  SURGEON:  Surgeon(s) and Role:    * Cindee Salt, MD - Primary  PHYSICIAN ASSISTANT:   ASSISTANTS: none   ANESTHESIA:   local and regional  EBL:  Total I/O In: 600 [I.V.:600] Out: 3 [Blood:3]  BLOOD ADMINISTERED:none  DRAINS: none   LOCAL MEDICATIONS USED:  BUPIVICAINE   SPECIMEN:  No Specimen  DISPOSITION OF SPECIMEN:  N/A  COUNTS:  YES  TOURNIQUET:   Total Tourniquet Time Documented: Forearm (Left) - 23 minutes Total: Forearm (Left) - 23 minutes   DICTATION: .Other Dictation: Dictation Number [id number]  PLAN OF CARE: Discharge to home after PACU  PATIENT DISPOSITION:  PACU - hemodynamically stable.

## 2016-11-15 NOTE — Transfer of Care (Signed)
Immediate Anesthesia Transfer of Care Note  Patient: Tonya Pope  Procedure(s) Performed: Procedure(s): LEFT CARPAL TUNNEL RELEASE (Left)  Patient Location: PACU  Anesthesia Type:MAC  Level of Consciousness: awake and alert   Airway & Oxygen Therapy: Patient Spontanous Breathing and Patient connected to face mask oxygen  Post-op Assessment: Report given to RN and Post -op Vital signs reviewed and stable  Post vital signs: Reviewed and stable  Last Vitals:  Vitals:   11/15/16 0942  BP: (!) 114/56  Pulse: 68  Resp: 16  Temp: 36.9 C    Last Pain:  Vitals:   11/15/16 0942  TempSrc: Oral         Complications: No apparent anesthesia complications

## 2016-11-15 NOTE — Anesthesia Preprocedure Evaluation (Signed)
Anesthesia Evaluation  Patient identified by MRN, date of birth, ID band Patient awake    Reviewed: Allergy & Precautions, NPO status , Patient's Chart, lab work & pertinent test results  Airway Mallampati: II   Neck ROM: Full    Dental no notable dental hx.    Pulmonary neg pulmonary ROS, former smoker,    breath sounds clear to auscultation       Cardiovascular negative cardio ROS   Rhythm:Regular Rate:Normal     Neuro/Psych negative neurological ROS  negative psych ROS   GI/Hepatic negative GI ROS, Neg liver ROS, GERD  ,  Endo/Other  negative endocrine ROS  Renal/GU negative Renal ROS  negative genitourinary   Musculoskeletal negative musculoskeletal ROS (+) Arthritis ,   Abdominal   Peds negative pediatric ROS (+)  Hematology negative hematology ROS (+)   Anesthesia Other Findings   Reproductive/Obstetrics negative OB ROS                             Anesthesia Physical Anesthesia Plan  ASA: I  Anesthesia Plan: MAC and Regional   Post-op Pain Management:    Induction: Intravenous  Airway Management Planned: Natural Airway  Additional Equipment:   Intra-op Plan:   Post-operative Plan:   Informed Consent: I have reviewed the patients History and Physical, chart, labs and discussed the procedure including the risks, benefits and alternatives for the proposed anesthesia with the patient or authorized representative who has indicated his/her understanding and acceptance.   Dental advisory given  Plan Discussed with: CRNA  Anesthesia Plan Comments:         Anesthesia Quick Evaluation

## 2016-11-16 ENCOUNTER — Encounter (HOSPITAL_BASED_OUTPATIENT_CLINIC_OR_DEPARTMENT_OTHER): Payer: Self-pay | Admitting: Orthopedic Surgery

## 2016-11-18 DIAGNOSIS — M79605 Pain in left leg: Secondary | ICD-10-CM | POA: Diagnosis not present

## 2016-11-18 DIAGNOSIS — M6281 Muscle weakness (generalized): Secondary | ICD-10-CM | POA: Diagnosis not present

## 2016-11-18 DIAGNOSIS — M545 Low back pain: Secondary | ICD-10-CM | POA: Diagnosis not present

## 2016-11-18 DIAGNOSIS — M542 Cervicalgia: Secondary | ICD-10-CM | POA: Diagnosis not present

## 2016-11-21 ENCOUNTER — Telehealth: Payer: Self-pay | Admitting: Adult Health

## 2016-11-21 DIAGNOSIS — M542 Cervicalgia: Secondary | ICD-10-CM | POA: Diagnosis not present

## 2016-11-21 DIAGNOSIS — M79605 Pain in left leg: Secondary | ICD-10-CM | POA: Diagnosis not present

## 2016-11-21 DIAGNOSIS — M6281 Muscle weakness (generalized): Secondary | ICD-10-CM | POA: Diagnosis not present

## 2016-11-21 DIAGNOSIS — M545 Low back pain: Secondary | ICD-10-CM | POA: Diagnosis not present

## 2016-11-21 LAB — STOOL CULTURE

## 2016-11-21 NOTE — Telephone Encounter (Signed)
Pt would like to know if results are back from her stool sample dropped off Friday.

## 2016-11-22 NOTE — Telephone Encounter (Signed)
See result note.  

## 2016-11-23 ENCOUNTER — Other Ambulatory Visit: Payer: Self-pay | Admitting: Adult Health

## 2016-11-23 DIAGNOSIS — R197 Diarrhea, unspecified: Secondary | ICD-10-CM

## 2016-11-24 DIAGNOSIS — M6281 Muscle weakness (generalized): Secondary | ICD-10-CM | POA: Diagnosis not present

## 2016-11-24 DIAGNOSIS — R197 Diarrhea, unspecified: Secondary | ICD-10-CM | POA: Diagnosis not present

## 2016-11-24 DIAGNOSIS — M542 Cervicalgia: Secondary | ICD-10-CM | POA: Diagnosis not present

## 2016-11-24 DIAGNOSIS — M545 Low back pain: Secondary | ICD-10-CM | POA: Diagnosis not present

## 2016-11-24 DIAGNOSIS — M79605 Pain in left leg: Secondary | ICD-10-CM | POA: Diagnosis not present

## 2016-11-24 NOTE — Addendum Note (Signed)
Addended by: Bonnye FavaKWEI, Natayla Cadenhead K on: 11/24/2016 01:41 PM   Modules accepted: Orders

## 2016-11-25 ENCOUNTER — Other Ambulatory Visit: Payer: Self-pay | Admitting: Adult Health

## 2016-11-25 LAB — CLOSTRIDIUM DIFFICILE BY PCR: Toxigenic C. Difficile by PCR: NOT DETECTED

## 2016-11-25 MED ORDER — CIPROFLOXACIN HCL 500 MG PO TABS: 500.0000 mg | ORAL_TABLET | Freq: Two times a day (BID) | ORAL | 0 refills | Status: DC

## 2016-11-30 ENCOUNTER — Encounter: Payer: Self-pay | Admitting: Physician Assistant

## 2016-11-30 ENCOUNTER — Ambulatory Visit (INDEPENDENT_AMBULATORY_CARE_PROVIDER_SITE_OTHER): Payer: Medicare Other | Admitting: Physician Assistant

## 2016-11-30 ENCOUNTER — Other Ambulatory Visit (INDEPENDENT_AMBULATORY_CARE_PROVIDER_SITE_OTHER): Payer: Medicare Other

## 2016-11-30 VITALS — BP 122/50 | HR 78 | Ht 61.0 in | Wt 129.4 lb

## 2016-11-30 DIAGNOSIS — R197 Diarrhea, unspecified: Secondary | ICD-10-CM

## 2016-11-30 DIAGNOSIS — Z8619 Personal history of other infectious and parasitic diseases: Secondary | ICD-10-CM

## 2016-11-30 DIAGNOSIS — Z86010 Personal history of colonic polyps: Secondary | ICD-10-CM | POA: Diagnosis not present

## 2016-11-30 LAB — CBC WITH DIFFERENTIAL/PLATELET
Basophils Absolute: 0 10*3/uL (ref 0.0–0.1)
Basophils Relative: 0.6 % (ref 0.0–3.0)
EOS PCT: 2.1 % (ref 0.0–5.0)
Eosinophils Absolute: 0.1 10*3/uL (ref 0.0–0.7)
HCT: 35.3 % — ABNORMAL LOW (ref 36.0–46.0)
Hemoglobin: 11.9 g/dL — ABNORMAL LOW (ref 12.0–15.0)
Lymphocytes Relative: 38.2 % (ref 12.0–46.0)
Lymphs Abs: 2.2 10*3/uL (ref 0.7–4.0)
MCHC: 33.6 g/dL (ref 30.0–36.0)
MCV: 95.1 fl (ref 78.0–100.0)
MONOS PCT: 9.4 % (ref 3.0–12.0)
Monocytes Absolute: 0.5 10*3/uL (ref 0.1–1.0)
NEUTROS ABS: 2.8 10*3/uL (ref 1.4–7.7)
NEUTROS PCT: 49.7 % (ref 43.0–77.0)
PLATELETS: 285 10*3/uL (ref 150.0–400.0)
RBC: 3.71 Mil/uL — ABNORMAL LOW (ref 3.87–5.11)
RDW: 13.4 % (ref 11.5–15.5)
WBC: 5.7 10*3/uL (ref 4.0–10.5)

## 2016-11-30 LAB — SEDIMENTATION RATE: Sed Rate: 2 mm/h (ref 0–30)

## 2016-11-30 MED ORDER — NA SULFATE-K SULFATE-MG SULF 17.5-3.13-1.6 GM/177ML PO SOLN: 1.0000 | Freq: Once | ORAL | 0 refills | Status: AC

## 2016-11-30 MED ORDER — SACCHAROMYCES BOULARDII 250 MG PO CAPS: 250.0000 mg | ORAL_CAPSULE | Freq: Two times a day (BID) | ORAL | 0 refills | Status: DC

## 2016-11-30 MED ORDER — DICYCLOMINE HCL 10 MG PO CAPS: ORAL_CAPSULE | ORAL | 1 refills | Status: DC

## 2016-11-30 MED ORDER — VANCOMYCIN HCL 125 MG PO CAPS: 125.0000 mg | ORAL_CAPSULE | Freq: Four times a day (QID) | ORAL | 0 refills | Status: AC

## 2016-11-30 NOTE — Patient Instructions (Addendum)
Please go to the basement level to have your labs drawn an stool study.  [facility] [address]    Vancomycin 250 mg Florastor 250 mg Benryl 10 mg  Tonya Pope will call you tomorrow to choose date for colonoscopy with Dr. Marina GoodellPerry.  You have signed the paperwork and I will mail you the instructions.

## 2016-11-30 NOTE — Progress Notes (Signed)
Subjective:    Patient ID: Tonya Pope, female    DOB: [birth year], 66 y.o.   MRN: [id number]  HPI Tonya Pope is a pleasant 66 year old white female, referred today by Netta Corrigan  NP primary care or evaluation of persistent diarrhea. Patient is known previously to Dr. Deatra Ina but has not been seen in GI over the past couple of years. She did have a prolonged episode of C. difficile in 2016 treated with prolonged course of vancomycin. Last colonoscopy was done in March 2008 4 history of adenomatous colon polyps. This was a negative exam and she was advised to have 10 year interval follow-up. Last EGD 2011 irregular Z line but biopsies were negative for Tonya's. Patient says her current problems started around April 9 when she began having diarrhea. She denies any other associated symptoms of abdominal pain cramping nausea vomiting fever chills etc. She initially was concerned she may have contracted salmonella. She was seen by primary care, stool cultures were done which was negative. She called back with persistent diarrhea and then had stool for C. difficile done which was also negative. She continues to have diarrhea and says her appetite is not great because she has urgency after every meal. She's had a weight loss of about 6 pounds. She describes significant urgency and has had one accident. She's not having any nocturnal diarrhea. She says her stool is very watery and is malodorous there has not been any blood. On a bad day she is having 6-7 bowel movements. She's been using Imodium without much benefit Stop been on any other new medications, says she had been taking Voltaren twice daily for several months and stopped that about 2 weeks ago. She says her symptoms do remind her previous C. difficile, she did not have any abdominal pain cramping fever etc. previously when she had C. difficile.  Review of Systems Pertinent positive and negative review of systems were noted in the above HPI  section.  All other review of systems was otherwise negative.  Outpatient Encounter Prescriptions as of 11/30/2016  Medication Sig  . aspirin EC 81 MG tablet Take 81 mg by mouth every other day.  . baclofen (LIORESAL) 20 MG tablet Take 20 mg by mouth 2 (two) times daily.  . ciprofloxacin (CIPRO) 500 MG tablet Take 1 tablet (500 mg total) by mouth 2 (two) times daily.  . diclofenac (VOLTAREN) 75 MG EC tablet Take 75 mg by mouth 2 (two) times daily.  Marland Kitchen HYDROcodone-acetaminophen (NORCO) 5-325 MG tablet Take 1 tablet by mouth every 6 (six) hours as needed for moderate pain.  Marland Kitchen HYDROcodone-acetaminophen (NORCO/VICODIN) 5-325 MG tablet Take 1 tablet by mouth as needed for moderate pain.  Marland Kitchen loperamide (IMODIUM A-D) 2 MG tablet Take 2 mg by mouth 2 (two) times daily. As needed  . pantoprazole (PROTONIX) 40 MG tablet TAKE 1 TABLET(40 MG) BY MOUTH DAILY  . tiZANidine (ZANAFLEX) 4 MG tablet Take 1 tablet (4 mg total) by mouth every 6 (six) hours as needed for muscle spasms.  Marland Kitchen venlafaxine XR (EFFEXOR-XR) 150 MG 24 hr capsule TAKE 1 CAPSULE BY MOUTH DAILY WITH BREAKFAST  . dicyclomine (BENTYL) 10 MG capsule Take 1 tab 30 min before meals.  . Na Sulfate-K Sulfate-Mg Sulf 17.5-3.13-1.6 GM/180ML SOLN Take 1 kit by mouth once.  . saccharomyces boulardii (FLORASTOR) 250 MG capsule Take 1 capsule (250 mg total) by mouth 2 (two) times daily.  . vancomycin (VANCOCIN) 125 MG capsule Take 1 capsule (125 mg total) by mouth  4 (four) times daily.   No facility-administered encounter medications on file as of 11/30/2016.    Allergies  Allergen Reactions  . Codeine Phosphate     REACTION: nausea  . Penicillins     REACTION: rash  . Promethazine Hcl     REACTION: CNS S.E.   Patient Active Problem List   Diagnosis Date Noted  . Medication reaction 12/26/2015  . Abdominal pain, epigastric 03/27/2014  . Panic attacks 02/17/2014  . Osteoarthritis of right hip 10/31/2013  . MITRAL VALVE PROLAPSE 08/03/2009  .  History of colonic polyps 05/08/2008  . Depression (emotion) 04/17/2008  . GERD 03/28/2007  . Hematuria 03/28/2007  . OSTEOPOROSIS 03/28/2007  . Insomnia 03/28/2007   Social History   Social History  . Marital status: Married    Spouse name: N/A  . Number of children: N/A  . Years of education: N/A   Occupational History  . Not on file.   Social History Main Topics  . Smoking status: Former Research scientist (life sciences)  . Smokeless tobacco: Never Used  . Alcohol use 0.0 oz/week     Comment: occ  . Drug use: No  . Sexual activity: Not on file   Other Topics Concern  . 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    Widowed    No kids   Likes to garden and read.          Ms. Tonya Pope family history includes Arthritis in her mother; Breast cancer in her maternal aunt; Colon cancer in her maternal grandfather and paternal grandmother; Depression in her sister; Diabetes in her mother; Glaucoma in her father; Heart disease in her mother; Hyperlipidemia in her father; Hypertension in her mother; Stroke in her mother; Thyroid disease in her sister.      Objective:    Vitals:   11/30/16 1434  BP: (!) 122/50  Pulse: 78    Physical Exam well-developed older white female in no acute distress, pleasant blood pressure 122/50 pulse 78, height 5 foot 1, weight 129, BMI of 24.4. HEENT; nontraumatic normocephalic EOMI PERRLA sclera anicteric, Cardiovascular; regular rate and rhythm with S1-S2 no murmur rub or gallop, Pulmonary; clear bilaterally, Abdomen;, nontender nondistended bowel sounds are active there is no palpable mass or hepatosplenomegaly, Rectal ;exam not done, Extremities; no clubbing cyanosis or edema skin warm and dry, Neuropsych; mood and affect appropriate       Assessment & Plan:   #45  66 year old female with 5 week history of watery diarrhea. No complaints of abdominal pain and cramping bleeding nausea or other systemic  complaints. Recent stool cultures negative and stool for C. difficile by PCR negative within the past 2 weeks. Etiology of diarrhea is not clear though still have some suspicion for C. difficile as this patient had a prolonged episode of relapsed C. difficile in 2016 with very similar presenting symptoms. Consider microscopic colitis #2 remote history of adenomatous colon polyps 2004 negative colonoscopy 2008 due for surveillance #3 history of GERD #4 osteoporosis #5 history of depression and panic disorder  Plan; CBC with differential, sedimentation rate, stool for lactoferrin, stool for C. difficile by PCR We will go ahead and start a course of vancomycin 250 mg by mouth 4 times daily 14 days Start floor store one by mouth twice a day 1 month Start Bentyl 10 mg 3 times a day one half hour before meals when necessary Patient is due for follow-up  colonoscopy. Will schedule for colonoscopy with Dr. Henrene Pastor. We will intentionally schedule 4-5 weeks out. Procedure discussed in detail with patient including risks and benefits and she is agreeable to proceed. If she does not have response to vancomycin and repeat stool C. difficile  PCR is negative and diarrhea persists she will need random biopsies done at the time of colonoscopy.  Amy S Esterwood PA-C 11/30/2016   Cc: Dorothyann Peng, NP

## 2016-12-01 ENCOUNTER — Other Ambulatory Visit: Payer: Self-pay | Admitting: *Deleted

## 2016-12-01 ENCOUNTER — Telehealth: Payer: Self-pay | Admitting: *Deleted

## 2016-12-01 DIAGNOSIS — R197 Diarrhea, unspecified: Secondary | ICD-10-CM

## 2016-12-01 DIAGNOSIS — M542 Cervicalgia: Secondary | ICD-10-CM | POA: Diagnosis not present

## 2016-12-01 DIAGNOSIS — M79605 Pain in left leg: Secondary | ICD-10-CM | POA: Diagnosis not present

## 2016-12-01 DIAGNOSIS — M545 Low back pain: Secondary | ICD-10-CM | POA: Diagnosis not present

## 2016-12-01 DIAGNOSIS — Z86010 Personal history of colonic polyps: Principal | ICD-10-CM

## 2016-12-01 DIAGNOSIS — M6281 Muscle weakness (generalized): Secondary | ICD-10-CM | POA: Diagnosis not present

## 2016-12-01 DIAGNOSIS — Z1211 Encounter for screening for malignant neoplasm of colon: Secondary | ICD-10-CM

## 2016-12-01 NOTE — Telephone Encounter (Signed)
Mailed out the colonoscopy prep instructions to the patient on 12-01-2016.  Procedures scheduled for 01-19-2017.

## 2016-12-01 NOTE — Progress Notes (Signed)
Physician assistant assessment and plans reviewed 

## 2016-12-05 ENCOUNTER — Other Ambulatory Visit: Payer: Medicare Other

## 2016-12-05 DIAGNOSIS — M79605 Pain in left leg: Secondary | ICD-10-CM | POA: Diagnosis not present

## 2016-12-05 DIAGNOSIS — M6281 Muscle weakness (generalized): Secondary | ICD-10-CM | POA: Diagnosis not present

## 2016-12-05 DIAGNOSIS — R197 Diarrhea, unspecified: Secondary | ICD-10-CM | POA: Diagnosis not present

## 2016-12-05 DIAGNOSIS — M542 Cervicalgia: Secondary | ICD-10-CM | POA: Diagnosis not present

## 2016-12-05 DIAGNOSIS — M545 Low back pain: Secondary | ICD-10-CM | POA: Diagnosis not present

## 2016-12-06 LAB — CLOSTRIDIUM DIFFICILE BY PCR: Toxigenic C. Difficile by PCR: NOT DETECTED

## 2016-12-07 DIAGNOSIS — M6281 Muscle weakness (generalized): Secondary | ICD-10-CM | POA: Diagnosis not present

## 2016-12-07 DIAGNOSIS — M79605 Pain in left leg: Secondary | ICD-10-CM | POA: Diagnosis not present

## 2016-12-07 DIAGNOSIS — M545 Low back pain: Secondary | ICD-10-CM | POA: Diagnosis not present

## 2016-12-07 DIAGNOSIS — M542 Cervicalgia: Secondary | ICD-10-CM | POA: Diagnosis not present

## 2016-12-07 LAB — FECAL LACTOFERRIN, QUANT: LACTOFERRIN: POSITIVE

## 2016-12-14 DIAGNOSIS — M542 Cervicalgia: Secondary | ICD-10-CM | POA: Diagnosis not present

## 2016-12-14 DIAGNOSIS — M79605 Pain in left leg: Secondary | ICD-10-CM | POA: Diagnosis not present

## 2016-12-14 DIAGNOSIS — M6281 Muscle weakness (generalized): Secondary | ICD-10-CM | POA: Diagnosis not present

## 2016-12-14 DIAGNOSIS — M545 Low back pain: Secondary | ICD-10-CM | POA: Diagnosis not present

## 2016-12-16 ENCOUNTER — Encounter (HOSPITAL_BASED_OUTPATIENT_CLINIC_OR_DEPARTMENT_OTHER): Payer: Self-pay | Admitting: Orthopedic Surgery

## 2016-12-16 NOTE — Addendum Note (Signed)
Addendum  created 12/16/16 1430 by Boniface Goffe, MD   Sign clinical note    

## 2016-12-19 ENCOUNTER — Other Ambulatory Visit: Payer: Self-pay | Admitting: Neurosurgery

## 2016-12-19 DIAGNOSIS — M5023 Other cervical disc displacement, cervicothoracic region: Secondary | ICD-10-CM | POA: Diagnosis not present

## 2016-12-19 DIAGNOSIS — M5412 Radiculopathy, cervical region: Secondary | ICD-10-CM | POA: Diagnosis not present

## 2016-12-20 ENCOUNTER — Encounter: Payer: Self-pay | Admitting: Adult Health

## 2016-12-20 ENCOUNTER — Ambulatory Visit (INDEPENDENT_AMBULATORY_CARE_PROVIDER_SITE_OTHER): Payer: Medicare Other | Admitting: Adult Health

## 2016-12-20 VITALS — BP 124/70 | Temp 98.3°F | Ht 61.0 in | Wt 127.4 lb

## 2016-12-20 DIAGNOSIS — Z1159 Encounter for screening for other viral diseases: Secondary | ICD-10-CM

## 2016-12-20 DIAGNOSIS — F32A Depression, unspecified: Secondary | ICD-10-CM

## 2016-12-20 DIAGNOSIS — N761 Subacute and chronic vaginitis: Secondary | ICD-10-CM

## 2016-12-20 DIAGNOSIS — F329 Major depressive disorder, single episode, unspecified: Secondary | ICD-10-CM

## 2016-12-20 DIAGNOSIS — M810 Age-related osteoporosis without current pathological fracture: Secondary | ICD-10-CM | POA: Diagnosis not present

## 2016-12-20 DIAGNOSIS — Z23 Encounter for immunization: Secondary | ICD-10-CM

## 2016-12-20 DIAGNOSIS — Z760 Encounter for issue of repeat prescription: Secondary | ICD-10-CM | POA: Diagnosis not present

## 2016-12-20 DIAGNOSIS — E782 Mixed hyperlipidemia: Secondary | ICD-10-CM | POA: Diagnosis not present

## 2016-12-20 LAB — BASIC METABOLIC PANEL
BUN: 22 mg/dL (ref 6–23)
CALCIUM: 9.6 mg/dL (ref 8.4–10.5)
CO2: 27 mEq/L (ref 19–32)
CREATININE: 0.91 mg/dL (ref 0.40–1.20)
Chloride: 105 mEq/L (ref 96–112)
GFR: 65.62 mL/min (ref >60.00)
Glucose, Bld: 93 mg/dL (ref 70–99)
Potassium: 4.5 mEq/L (ref 3.5–5.1)
SODIUM: 139 meq/L (ref 135–145)

## 2016-12-20 LAB — LIPID PANEL
CHOL/HDL RATIO: 3
Cholesterol: 190 mg/dL (ref 0–200)
HDL: 75.3 mg/dL (ref >39.00)
LDL Cholesterol: 100 mg/dL — ABNORMAL HIGH (ref 0–99)
NONHDL: 114.95
Triglycerides: 75 mg/dL (ref 0.0–149.0)
VLDL: 15 mg/dL (ref 0.0–40.0)

## 2016-12-20 LAB — HEPATIC FUNCTION PANEL
ALK PHOS: 74 U/L (ref 39–117)
ALT: 73 U/L — ABNORMAL HIGH (ref 0–35)
AST: 43 U/L — AB (ref 0–37)
Albumin: 4.5 g/dL (ref 3.5–5.2)
BILIRUBIN DIRECT: 0.2 mg/dL (ref 0.0–0.3)
TOTAL PROTEIN: 6.6 g/dL (ref 6.0–8.3)
Total Bilirubin: 0.8 mg/dL (ref 0.2–1.2)

## 2016-12-20 LAB — CBC WITH DIFFERENTIAL/PLATELET
BASOS ABS: 0 10*3/uL (ref 0.0–0.1)
Basophils Relative: 0.9 % (ref 0.0–3.0)
EOS ABS: 0.2 10*3/uL (ref 0.0–0.7)
Eosinophils Relative: 4.7 % (ref 0.0–5.0)
HCT: 38 % (ref 36.0–46.0)
HEMOGLOBIN: 12.9 g/dL (ref 12.0–15.0)
Lymphocytes Relative: 40.2 % (ref 12.0–46.0)
Lymphs Abs: 1.5 10*3/uL (ref 0.7–4.0)
MCHC: 34 g/dL (ref 30.0–36.0)
MCV: 94.6 fl (ref 78.0–100.0)
Monocytes Absolute: 0.3 10*3/uL (ref 0.1–1.0)
Monocytes Relative: 8.2 % (ref 3.0–12.0)
NEUTROS ABS: 1.7 10*3/uL (ref 1.4–7.7)
Neutrophils Relative %: 46 % (ref 43.0–77.0)
Platelets: 201 10*3/uL (ref 150.0–400.0)
RBC: 4.01 Mil/uL (ref 3.87–5.11)
RDW: 14.2 % (ref 11.5–15.5)
WBC: 3.8 10*3/uL — AB (ref 4.0–10.5)

## 2016-12-20 LAB — VITAMIN D 25 HYDROXY (VIT D DEFICIENCY, FRACTURES): VITD: 75.74 ng/mL (ref 30.00–100.00)

## 2016-12-20 LAB — TSH: TSH: 2.65 u[IU]/mL (ref 0.35–4.50)

## 2016-12-20 MED ORDER — ESTROGENS, CONJUGATED 0.625 MG/GM VA CREA: 1.0000 g | TOPICAL_CREAM | Freq: Every day | VAGINAL | 11 refills | Status: DC

## 2016-12-20 MED ORDER — VENLAFAXINE HCL ER 150 MG PO CP24: ORAL_CAPSULE | ORAL | 1 refills | Status: DC

## 2016-12-20 NOTE — Patient Instructions (Signed)
It was great seeing you today   I will follow up with you regarding your blood work.   Please follow up with GI   I will see you in one year for your next annual exam   Best of luck with the upcoming surgery

## 2016-12-20 NOTE — Addendum Note (Signed)
Addended by: Janelle FloorHOMPSON, Yohanna Tow B on: 12/20/2016 08:43 AM   Modules accepted: Orders

## 2016-12-20 NOTE — Progress Notes (Addendum)
Subjective:    Patient ID: Tonya Pope, female    DOB: 12/15/1950, 66 y.o.   MRN: [id number]  HPI  Patient presents for yearly follow up exam. She is a pleasant 66 year old female who  has a past medical history of Acne; Colon polyps; Depression; Diarrhea; GERD (gastroesophageal reflux disease); Hyperlipidemia; Insomnia; Migraine; and OP (osteoporosis).  All immunizations and health maintenance protocols were reviewed with the patient and needed orders were placed. She is due for her Prevnar 5723.   Appropriate screening laboratory values were ordered for the patient including screening of hyperlipidemia, renal function and hepatic function. If indicated by BPH, a PSA was ordered  Medication reconciliation,  past medical history, social history, problem list and allergies were reviewed in detail with the patient  Goals were established with regard to weight loss, exercise, and  diet in compliance with medications  End of life planning was discussed.  She has an appointment for her colonoscopy. She is due in 2019 for her mammogram  She reports that she will be having " neck surgery" in the upcoming weeks.   She has been seen by GI last month for chronic diarrhea and was prescribed a course of Vancomycin. She reports that her diarrhea has improved but she continues to have loose stools. She is going to call GI today for follow up    Review of Systems  Constitutional: Negative.   HENT: Negative.   Eyes: Negative.   Respiratory: Negative.   Cardiovascular: Negative.   Gastrointestinal: Positive for diarrhea. Negative for abdominal pain.  Endocrine: Negative.   Genitourinary: Negative.   Musculoskeletal: Negative.   Skin: Negative.   Allergic/Immunologic: Negative.   Neurological: Negative.   Hematological: Negative.   Psychiatric/Behavioral: Negative.   All other systems reviewed and are negative.  Past Medical History:  Diagnosis Date  . Acne   . Colon polyps   .  Depression   . Diarrhea    C. Diff  . GERD (gastroesophageal reflux disease)   . Hyperlipidemia   . Insomnia   . Migraine   . OP (osteoporosis)     Social History   Social History  . Marital status: Married    Spouse name: N/A  . Number of children: N/A  . Years of education: N/A   Occupational History  . Not on file.   Social History Main Topics  . Smoking status: Former Games developermoker  . Smokeless tobacco: Never Used  . Alcohol use 0.0 oz/week     Comment: occ  . Drug use: No  . Sexual activity: Not on file   Other Topics Concern  . 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    Widowed    No kids   Likes to garden and read.          Past Surgical History:  Procedure Laterality Date  . ABDOMINAL HYSTERECTOMY    . CARPAL TUNNEL RELEASE Left 11/15/2016   Procedure: LEFT CARPAL TUNNEL RELEASE;  Surgeon: Cindee SaltKuzma, Gary, MD;  Location: [facility];  Service: Orthopedics;  Laterality: Left;  . COLONOSCOPY W/ POLYPECTOMY    . TONSILLECTOMY AND ADENOIDECTOMY      Family History  Problem Relation Age of Onset  . Stroke Mother   . Arthritis Mother   . Diabetes Mother   . Heart disease Mother   . Hypertension Mother   . Hyperlipidemia Father   .  Glaucoma Father   . Thyroid disease Sister   . Breast cancer Maternal Aunt   . Colon cancer Maternal Grandfather   . Depression Sister   . Colon cancer Paternal Grandmother     Allergies  Allergen Reactions  . Codeine Phosphate     REACTION: nausea  . Penicillins     REACTION: rash  . Promethazine Hcl     REACTION: CNS S.E.    Current Outpatient Prescriptions on File Prior to Visit  Medication Sig Dispense Refill  . aspirin EC 81 MG tablet Take 81 mg by mouth every other day.    . baclofen (LIORESAL) 20 MG tablet Take 20 mg by mouth 2 (two) times daily.    . diclofenac (VOLTAREN) 75 MG EC tablet Take 75 mg by mouth 2 (two) times daily.    Marland Kitchen  dicyclomine (BENTYL) 10 MG capsule Take 1 tab 30 min before meals. 30 capsule 1  . HYDROcodone-acetaminophen (NORCO) 5-325 MG tablet Take 1 tablet by mouth every 6 (six) hours as needed for moderate pain. 25 tablet 0  . HYDROcodone-acetaminophen (NORCO/VICODIN) 5-325 MG tablet Take 1 tablet by mouth as needed for moderate pain.    Marland Kitchen loperamide (IMODIUM A-D) 2 MG tablet Take 2 mg by mouth 2 (two) times daily. As needed    . pantoprazole (PROTONIX) 40 MG tablet TAKE 1 TABLET(40 MG) BY MOUTH DAILY 90 tablet 1  . saccharomyces boulardii (FLORASTOR) 250 MG capsule Take 1 capsule (250 mg total) by mouth 2 (two) times daily. 60 capsule 0  . tiZANidine (ZANAFLEX) 4 MG tablet Take 1 tablet (4 mg total) by mouth every 6 (six) hours as needed for muscle spasms. 90 tablet 3   No current facility-administered medications on file prior to visit.     BP 124/70 (BP Location: Left Arm, Patient Position: Sitting, Cuff Size: Normal)   Temp 98.3 F (36.8 C) (Oral)   Ht 5\' 1"  (1.549 m)   Wt 127 lb 6.4 oz (57.8 kg)   BMI 24.07 kg/m       Objective:   Physical Exam  Constitutional: She is oriented to person, place, and time. She appears well-developed and well-nourished. No distress.  HENT:  Head: Normocephalic and atraumatic.  Right Ear: External ear normal.  Left Ear: External ear normal.  Nose: Nose normal.  Mouth/Throat: Oropharynx is clear and moist. No oropharyngeal exudate.  Eyes: Conjunctivae and EOM are normal. Pupils are equal, round, and reactive to light. Right eye exhibits no discharge. Left eye exhibits no discharge. No scleral icterus.  Neck: Normal range of motion. Neck supple. No JVD present. Carotid bruit is not present. No tracheal deviation present. No thyromegaly present.  Cardiovascular: Normal rate, regular rhythm, normal heart sounds and intact distal pulses.  Exam reveals no gallop and no friction rub.   No murmur heard. Pulmonary/Chest: Effort normal and breath sounds normal. No  stridor. No respiratory distress. She has no wheezes. She has no rales. She exhibits no tenderness.  Abdominal: Soft. Bowel sounds are normal. She exhibits no distension and no mass. There is no tenderness. There is no rebound and no guarding.  Genitourinary:  Genitourinary Comments: Breast Exam: No masses, lumps, dimpling, or discharge noted   Musculoskeletal: Normal range of motion. She exhibits no edema, tenderness or deformity.  Lymphadenopathy:    She has no cervical adenopathy.  Neurological: She is alert and oriented to person, place, and time. She has normal reflexes. She displays normal reflexes. No cranial nerve deficit. She exhibits  normal muscle tone. Coordination normal.  Skin: Skin is warm and dry. No rash noted. She is not diaphoretic. No erythema. No pallor.  Psychiatric: She has a normal mood and affect. Her behavior is normal. Judgment and thought content normal.  Nursing note and vitals reviewed.      Assessment & Plan:  1. Mixed hyperlipidemia  - Basic metabolic panel - CBC with Differential/Platelet - Hepatic function panel - Lipid panel - TSH  2. Age-related osteoporosis without current pathological fracture  - conjugated estrogens (PREMARIN) vaginal cream; Place 0.5 Applicatorfuls vaginally daily.  Dispense: 42.5 g; Refill: 11 - Vitamin D, 25-hydroxy  3. Depression, unspecified depression type - Well controlled with Effexor. No change in therapy   4. Need for hepatitis C screening test  - Hep C Antibody  5. Chronic vaginitis  - conjugated estrogens (PREMARIN) vaginal cream; Place 0.5 Applicatorfuls vaginally daily.  Dispense: 42.5 g; Refill: 11  6. Medication refill  - conjugated estrogens (PREMARIN) vaginal cream; Place 0.5 Applicatorfuls vaginally daily.  Dispense: 42.5 g; Refill: 11  7. Need for 23-polyvalent pneumococcal polysaccharide vaccine - Vaccination given   Shirline Frees, NP

## 2016-12-21 ENCOUNTER — Other Ambulatory Visit: Payer: Self-pay

## 2016-12-21 ENCOUNTER — Telehealth: Payer: Self-pay | Admitting: Physician Assistant

## 2016-12-21 LAB — HEPATITIS C ANTIBODY: HCV AB: NEGATIVE

## 2016-12-21 NOTE — Telephone Encounter (Signed)
Procedure moved to August. Patient will mail back a new consent form. She has new instructions as well.

## 2016-12-21 NOTE — Telephone Encounter (Signed)
IF not having diarrhea would try to back off on Imodium gradually. Could add Florastor BID x one month. I think she is fine to have her neck surgery at this point.. I did not realize cervical spine surgery was 6/29 and Colon scheduled the follow ing week - that is not a great plan - needs to be able to flex neck etc . I think she should wait  At least 4-6 weeks after cervical surgery for Colonoscopy

## 2016-12-21 NOTE — Telephone Encounter (Signed)
Completed Vanco on 12/16/16 She has 3 soft formed stools each day. She takes 2 Imodium twice daily on average. Procedure is July 5 with Dr Marina GoodellPerry. Her neck surgery is 01/13/17.  She is calling to give a progress update. Also asks if there is any reason she should not go forward with her neck surgery in your opinion?

## 2016-12-26 DIAGNOSIS — H40053 Ocular hypertension, bilateral: Secondary | ICD-10-CM | POA: Diagnosis not present

## 2016-12-26 DIAGNOSIS — H52203 Unspecified astigmatism, bilateral: Secondary | ICD-10-CM | POA: Diagnosis not present

## 2016-12-26 DIAGNOSIS — H524 Presbyopia: Secondary | ICD-10-CM | POA: Diagnosis not present

## 2017-01-03 ENCOUNTER — Other Ambulatory Visit: Payer: Self-pay | Admitting: Adult Health

## 2017-01-03 DIAGNOSIS — Z1231 Encounter for screening mammogram for malignant neoplasm of breast: Secondary | ICD-10-CM

## 2017-01-03 NOTE — Pre-Procedure Instructions (Signed)
Tonya Pope  01/03/2017      [facility] - [place], [place] - [address] [place] [address] Phone: [phone] Fax: [phone]  [facility] [address], [place] - [address] [place] [address] Phone: [phone] Fax: [phone]  [facility] - [place], [place] - [address] [facility] [address] [place] [address] [place] [address] Phone: [phone] Fax: [phone]  [facility] - [place], [place] - [address] AT [facility] [address] [place] [address] Phone: [phone] Fax: [phone]  [facility] # [address] [place] - [address] [place] [address] Phone: [phone] Fax: [phone]    Your procedure is scheduled on June 29  Report to [facility] Admitting at 0530 A.M.  Call this number if you have problems the morning of surgery:  [phone]   Remember:  Do not eat food or drink liquids after midnight.   Take these medicines the morning of surgery with A SIP OF WATER baclofen (LIORESAL),diclofenac (VOLTAREN),   HYDROcodone-acetaminophen (NORCO),  pantoprazole (PROTONIX) ,  venlafaxine XR (EFFEXOR-XR)  7 days prior to surgery STOP taking any Aspirin, Aleve, Naproxen, Ibuprofen, Motrin, Advil, Goody's, BC's, all herbal medications, fish oil, and all vitamins    Do not wear jewelry, make-up or nail polish.  Do not wear lotions, powders, or perfumes, or deoderant.  Do not shave 48 hours prior to surgery.    Do not bring valuables to the hospital.  [facility] is not responsible for any belongings or valuables.  Contacts, dentures or bridgework may not be worn into surgery.  Leave your suitcase in the car.  After surgery it may be brought to your room.  For patients admitted to the hospital, discharge time will be determined by your treatment  team.  Patients discharged the day of surgery will not be allowed to drive home.    Special instructions:   [facility]- Preparing For Surgery  Before surgery, you can play an important role. Because skin is not sterile, your skin needs to be as free of germs as possible. You can reduce the number of germs on your skin by washing with CHG (chlorahexidine gluconate) Soap before surgery.  CHG is an antiseptic cleaner which kills germs and bonds with the skin to continue killing germs even after washing.  Please do not use if you have an allergy to CHG or antibacterial soaps. If your skin becomes reddened/irritated stop using the CHG.  Do not shave (including legs and underarms) for at least 48 hours prior to first CHG shower. It is OK to shave your face.  Please follow these instructions carefully.   1. Shower the NIGHT BEFORE SURGERY and the MORNING OF SURGERY with CHG.   2. If you chose to wash your hair, wash your hair first as usual with your normal shampoo.  3. After you shampoo, rinse your hair and body thoroughly to remove the shampoo.  4. Use CHG as you would any other liquid soap. You can apply CHG directly to the skin and wash gently with a scrungie or a clean washcloth.   5. Apply the CHG Soap to your body ONLY FROM THE NECK DOWN.  Do not use on open wounds or open sores. Avoid contact with your eyes, ears, mouth and genitals (private parts). Wash genitals (private  parts) with your normal soap.  6. Wash thoroughly, paying special attention to the area where your surgery will be performed.  7. Thoroughly rinse your body with warm water from the neck down.  8. DO NOT shower/wash with your normal soap after using and rinsing off the CHG Soap.  9. Pat yourself dry with a CLEAN TOWEL.   10. Wear CLEAN PAJAMAS   11. Place CLEAN SHEETS on your bed the night of your first shower and DO NOT SLEEP WITH PETS.    Day of Surgery: Do not apply any deodorants/lotions. Please wear  clean clothes to the hospital/surgery center.      Please read over the following fact sheets that you were given.

## 2017-01-04 ENCOUNTER — Encounter (HOSPITAL_COMMUNITY): Payer: Self-pay

## 2017-01-04 ENCOUNTER — Encounter (HOSPITAL_COMMUNITY)
Admission: RE | Admit: 2017-01-04 | Discharge: 2017-01-04 | Disposition: A | Discharge status: Home / Self Care | Payer: Medicare Other | Source: Ambulatory Visit | Attending: Neurosurgery | Admitting: Neurosurgery

## 2017-01-04 DIAGNOSIS — M50221 Other cervical disc displacement at C4-C5 level: Secondary | ICD-10-CM | POA: Insufficient documentation

## 2017-01-04 DIAGNOSIS — Z01812 Encounter for preprocedural laboratory examination: Secondary | ICD-10-CM | POA: Insufficient documentation

## 2017-01-04 HISTORY — DX: Unspecified osteoarthritis, unspecified site: M19.90

## 2017-01-04 HISTORY — DX: Pneumonia, unspecified organism: J18.9

## 2017-01-04 HISTORY — DX: Anemia, unspecified: D64.9

## 2017-01-04 HISTORY — DX: Anxiety disorder, unspecified: F41.9

## 2017-01-04 HISTORY — DX: Failed or difficult intubation, initial encounter: T88.4XXA

## 2017-01-04 LAB — BASIC METABOLIC PANEL
Anion gap: 6 (ref 5–15)
BUN: 24 mg/dL — ABNORMAL HIGH (ref 6–20)
CALCIUM: 9.3 mg/dL (ref 8.9–10.3)
CO2: 22 mmol/L (ref 22–32)
CREATININE: 1.27 mg/dL — AB (ref 0.44–1.00)
Chloride: 108 mmol/L (ref 101–111)
GFR, EST AFRICAN AMERICAN: 50 mL/min — AB (ref >60)
GFR, EST NON AFRICAN AMERICAN: 43 mL/min — AB (ref >60)
Glucose, Bld: 95 mg/dL (ref 65–99)
Potassium: 4 mmol/L (ref 3.5–5.1)
SODIUM: 136 mmol/L (ref 135–145)

## 2017-01-04 LAB — CBC
HCT: 37.2 % (ref 36.0–46.0)
Hemoglobin: 12.5 g/dL (ref 12.0–15.0)
MCH: 31.3 pg (ref 26.0–34.0)
MCHC: 33.6 g/dL (ref 30.0–36.0)
MCV: 93.2 fL (ref 78.0–100.0)
PLATELETS: 186 10*3/uL (ref 150–400)
RBC: 3.99 MIL/uL (ref 3.87–5.11)
RDW: 13.6 % (ref 11.5–15.5)
WBC: 6.1 10*3/uL (ref 4.0–10.5)

## 2017-01-04 LAB — SURGICAL PCR SCREEN
MRSA, PCR: NEGATIVE
Staphylococcus aureus: NEGATIVE

## 2017-01-04 LAB — ABO/RH: ABO/RH(D): A POS

## 2017-01-04 LAB — TYPE AND SCREEN
ABO/RH(D): A POS
ANTIBODY SCREEN: NEGATIVE

## 2017-01-04 NOTE — Pre-Procedure Instructions (Signed)
Tonya Pope  01/04/2017      [facility] - [place], [place] - [address] [place] [address] Phone: [phone] Fax: [phone]  [facility] [address] - [place], [place] - [id number] [place] [address] Phone: [phone] Fax: [phone]  [facility] - [place], [place] - [id number] [facility] [address] [place] [address] [place] [address] Phone: [phone] Fax: [phone]  [facility] - [place], [place] - [address] AT [facility] [address] & [address] [address] [place] [address] Phone: [phone] Fax: [phone]  [facility] # [address] - [place], [place] - [address] [place] [address] Phone: [phone] Fax: [phone]    Your procedure is scheduled on Friday, January 13, 2017  [facility] at [facility] A.M.  Call this number if you have problems the morning of surgery:  [phone]   Remember:  Do not eat food or drink liquids after midnight.   Take these medicines the morning of surgery with A SIP OF WATER baclofen (LIORESAL), HYDROcodone-acetaminophen (NORCO),  pantoprazole (PROTONIX) ,  venlafaxine XR (EFFEXOR-XR)  7 days prior to surgery STOP taking any Aspirin, Aleve, Naproxen, Ibuprofen, Motrin, Advil, Goody's, BC's, all herbal medications, fish oil, and all vitamins including diclofenac (VOLTAREN)    Do not wear jewelry, make-up or nail polish.  Do not wear lotions, powders, or perfumes, or deoderant.  Do not shave 48 hours prior to surgery.    Do not bring valuables to the hospital.  [facility] is not responsible for any belongings or valuables.  Contacts, dentures or bridgework may not be worn into surgery.  Leave your suitcase in the car.  After surgery it may be brought to your room.  For patients admitted to the hospital, discharge time will be determined by your  treatment team.  Patients discharged the day of surgery will not be allowed to drive home.    Special instructions:   [name]- Preparing For Surgery  Before surgery, you can play an important role. Because skin is not sterile, your skin needs to be as free of germs as possible. You can reduce the number of germs on your skin by washing with CHG (chlorahexidine gluconate) Soap before surgery.  CHG is an antiseptic cleaner which kills germs and bonds with the skin to continue killing germs even after washing.  Please do not use if you have an allergy to CHG or antibacterial soaps. If your skin becomes reddened/irritated stop using the CHG.  Do not shave (including legs and underarms) for at least 48 hours prior to first CHG shower. It is OK to shave your face.  Please follow these instructions carefully.   1. Shower the NIGHT BEFORE SURGERY and the MORNING OF SURGERY with CHG.   2. If you chose to wash your hair, wash your hair first as usual with your normal shampoo.  3. After you shampoo, rinse your hair and body thoroughly to remove the shampoo.  4. Use CHG as you would any other liquid soap. You can apply CHG directly to the skin and wash gently with a scrungie or a clean washcloth.   5. Apply the CHG Soap to your body ONLY FROM THE NECK DOWN.  Do not use on open wounds or open sores. Avoid contact with your eyes, ears, mouth and genitals (private parts). Wash  genitals (private parts) with your normal soap.  6. Wash thoroughly, paying special attention to the area where your surgery will be performed.  7. Thoroughly rinse your body with warm water from the neck down.  8. DO NOT shower/wash with your normal soap after using and rinsing off the CHG Soap.  9. Pat yourself dry with a CLEAN TOWEL.   10. Wear CLEAN PAJAMAS   11. Place CLEAN SHEETS on your bed the night of your first shower and DO NOT SLEEP WITH PETS.    Day of Surgery: Do not apply any deodorants/lotions.  Please wear clean clothes to the hospital/surgery center.      Please read over the following fact sheets that you were given.

## 2017-01-04 NOTE — Progress Notes (Signed)
  Patient denies shortness of breath, fever, cough and chest pain at PAT appointment  Patient stated that she was told in the 1980's that they had to "use a small tube" when being intubated for surgeries.   It was noteated in patient's history and spoke with Marylene LandAngela, NP with anesthesia who had me reassure the patient that there have been advances in anesthesia intubation, such as the video scope to help guide.  Patient verbalized understanding of instructions that were given to them at the PAT appointment. Patient was also instructed that they will need to review over the PAT instructions again at home before surgery.

## 2017-01-10 ENCOUNTER — Ambulatory Visit: Payer: Medicare Other

## 2017-01-13 ENCOUNTER — Inpatient Hospital Stay (HOSPITAL_COMMUNITY): Payer: Medicare Other

## 2017-01-13 ENCOUNTER — Inpatient Hospital Stay (HOSPITAL_COMMUNITY): Payer: Medicare Other | Admitting: Emergency Medicine

## 2017-01-13 ENCOUNTER — Inpatient Hospital Stay (HOSPITAL_COMMUNITY): Payer: Medicare Other | Admitting: Critical Care Medicine

## 2017-01-13 ENCOUNTER — Inpatient Hospital Stay (HOSPITAL_COMMUNITY)
Admission: RE | Admit: 2017-01-13 | Discharge: 2017-01-15 | DRG: 473 | Disposition: A | Discharge status: Home / Self Care | Payer: Medicare Other | Source: Ambulatory Visit | Attending: Neurosurgery | Admitting: Neurosurgery

## 2017-01-13 ENCOUNTER — Encounter (HOSPITAL_COMMUNITY): Payer: Self-pay

## 2017-01-13 ENCOUNTER — Inpatient Hospital Stay (HOSPITAL_COMMUNITY)
Admission: RE | Disposition: A | Discharge status: Home / Self Care | Payer: Self-pay | Source: Ambulatory Visit | Attending: Neurosurgery

## 2017-01-13 DIAGNOSIS — Z419 Encounter for procedure for purposes other than remedying health state, unspecified: Secondary | ICD-10-CM

## 2017-01-13 DIAGNOSIS — Z87891 Personal history of nicotine dependence: Secondary | ICD-10-CM | POA: Diagnosis not present

## 2017-01-13 DIAGNOSIS — Z791 Long term (current) use of non-steroidal anti-inflammatories (NSAID): Secondary | ICD-10-CM

## 2017-01-13 DIAGNOSIS — Z800 Family history of malignant neoplasm of digestive organs: Secondary | ICD-10-CM | POA: Diagnosis not present

## 2017-01-13 DIAGNOSIS — F329 Major depressive disorder, single episode, unspecified: Secondary | ICD-10-CM | POA: Diagnosis present

## 2017-01-13 DIAGNOSIS — Z888 Allergy status to other drugs, medicaments and biological substances status: Secondary | ICD-10-CM | POA: Diagnosis not present

## 2017-01-13 DIAGNOSIS — Z8261 Family history of arthritis: Secondary | ICD-10-CM

## 2017-01-13 DIAGNOSIS — Z803 Family history of malignant neoplasm of breast: Secondary | ICD-10-CM

## 2017-01-13 DIAGNOSIS — M47812 Spondylosis without myelopathy or radiculopathy, cervical region: Secondary | ICD-10-CM | POA: Diagnosis present

## 2017-01-13 DIAGNOSIS — Z818 Family history of other mental and behavioral disorders: Secondary | ICD-10-CM | POA: Diagnosis not present

## 2017-01-13 DIAGNOSIS — M2578 Osteophyte, vertebrae: Secondary | ICD-10-CM | POA: Diagnosis present

## 2017-01-13 DIAGNOSIS — K219 Gastro-esophageal reflux disease without esophagitis: Secondary | ICD-10-CM | POA: Diagnosis present

## 2017-01-13 DIAGNOSIS — F418 Other specified anxiety disorders: Secondary | ICD-10-CM | POA: Diagnosis not present

## 2017-01-13 DIAGNOSIS — Z880 Allergy status to penicillin: Secondary | ICD-10-CM | POA: Diagnosis not present

## 2017-01-13 DIAGNOSIS — Z79890 Hormone replacement therapy (postmenopausal): Secondary | ICD-10-CM | POA: Diagnosis not present

## 2017-01-13 DIAGNOSIS — F419 Anxiety disorder, unspecified: Secondary | ICD-10-CM | POA: Diagnosis present

## 2017-01-13 DIAGNOSIS — M50922 Unspecified cervical disc disorder at C5-C6 level: Secondary | ICD-10-CM | POA: Diagnosis not present

## 2017-01-13 DIAGNOSIS — M50921 Unspecified cervical disc disorder at C4-C5 level: Secondary | ICD-10-CM | POA: Diagnosis not present

## 2017-01-13 DIAGNOSIS — M4802 Spinal stenosis, cervical region: Secondary | ICD-10-CM | POA: Diagnosis not present

## 2017-01-13 DIAGNOSIS — M4322 Fusion of spine, cervical region: Secondary | ICD-10-CM | POA: Diagnosis not present

## 2017-01-13 DIAGNOSIS — E785 Hyperlipidemia, unspecified: Secondary | ICD-10-CM | POA: Diagnosis not present

## 2017-01-13 HISTORY — DX: Spinal stenosis, cervical region: M48.02

## 2017-01-13 HISTORY — PX: ANTERIOR CERVICAL DECOMP/DISCECTOMY FUSION: SHX1161

## 2017-01-13 SURGERY — ANTERIOR CERVICAL DECOMPRESSION/DISCECTOMY FUSION 2 LEVELS
Anesthesia: General

## 2017-01-13 MED ORDER — PANTOPRAZOLE SODIUM 40 MG PO TBEC
40.0000 mg | DELAYED_RELEASE_TABLET | Freq: Two times a day (BID) | ORAL | Status: DC
  Administered 2017-01-14 – 2017-01-15 (×3): 40 mg via ORAL
  Filled 2017-01-13 (×4): qty 1

## 2017-01-13 MED ORDER — ACETAMINOPHEN 325 MG PO TABS: 650.0000 mg | ORAL_TABLET | ORAL | Status: DC | PRN

## 2017-01-13 MED ORDER — MENTHOL 3 MG MT LOZG: 1.0000 | LOZENGE | OROMUCOSAL | Status: DC | PRN

## 2017-01-13 MED ORDER — ONDANSETRON HCL 4 MG/2ML IJ SOLN: 4.0000 mg | Freq: Four times a day (QID) | INTRAMUSCULAR | Status: DC | PRN

## 2017-01-13 MED ORDER — SUGAMMADEX SODIUM 200 MG/2ML IV SOLN
INTRAVENOUS | Status: DC | PRN
  Administered 2017-01-13: 115 mg via INTRAVENOUS

## 2017-01-13 MED ORDER — MIDAZOLAM HCL 5 MG/5ML IJ SOLN
INTRAMUSCULAR | Status: DC | PRN
  Administered 2017-01-13 (×2): 1 mg via INTRAVENOUS

## 2017-01-13 MED ORDER — ACETAMINOPHEN 650 MG RE SUPP: 650.0000 mg | RECTAL | Status: DC | PRN

## 2017-01-13 MED ORDER — EPHEDRINE SULFATE 50 MG/ML IJ SOLN
INTRAMUSCULAR | Status: DC | PRN
  Administered 2017-01-13 (×2): 5 mg via INTRAVENOUS

## 2017-01-13 MED ORDER — VANCOMYCIN HCL IN DEXTROSE 1-5 GM/200ML-% IV SOLN
1000.0000 mg | INTRAVENOUS | Status: AC
  Administered 2017-01-13: 1000 mg via INTRAVENOUS
  Filled 2017-01-13: qty 200

## 2017-01-13 MED ORDER — HYDROMORPHONE HCL 1 MG/ML IJ SOLN
0.5000 mg | INTRAMUSCULAR | Status: DC | PRN
  Administered 2017-01-13 – 2017-01-14 (×4): 0.5 mg via INTRAVENOUS
  Filled 2017-01-13 (×4): qty 0.5

## 2017-01-13 MED ORDER — ONDANSETRON HCL 4 MG PO TABS: 4.0000 mg | ORAL_TABLET | Freq: Four times a day (QID) | ORAL | Status: DC | PRN

## 2017-01-13 MED ORDER — THROMBIN 5000 UNITS EX SOLR
CUTANEOUS | Status: AC
  Filled 2017-01-13: qty 15000

## 2017-01-13 MED ORDER — CEFAZOLIN SODIUM-DEXTROSE 2-4 GM/100ML-% IV SOLN
2.0000 g | Freq: Once | INTRAVENOUS | Status: AC
  Administered 2017-01-13: 2 g via INTRAVENOUS
  Filled 2017-01-13: qty 100

## 2017-01-13 MED ORDER — CYCLOBENZAPRINE HCL 10 MG PO TABS
10.0000 mg | ORAL_TABLET | Freq: Three times a day (TID) | ORAL | Status: DC | PRN
  Administered 2017-01-13: 10 mg via ORAL

## 2017-01-13 MED ORDER — ALUM & MAG HYDROXIDE-SIMETH 200-200-20 MG/5ML PO SUSP: 30.0000 mL | Freq: Four times a day (QID) | ORAL | Status: DC | PRN

## 2017-01-13 MED ORDER — SODIUM CHLORIDE 0.9% FLUSH
3.0000 mL | INTRAVENOUS | Status: DC | PRN
  Administered 2017-01-14: 3 mL via INTRAVENOUS
  Filled 2017-01-13: qty 3

## 2017-01-13 MED ORDER — 0.9 % SODIUM CHLORIDE (POUR BTL) OPTIME
TOPICAL | Status: DC | PRN
  Administered 2017-01-13: 1000 mL

## 2017-01-13 MED ORDER — TIZANIDINE HCL 4 MG PO TABS
4.0000 mg | ORAL_TABLET | Freq: Every day | ORAL | Status: DC
  Administered 2017-01-13 – 2017-01-14 (×2): 4 mg via ORAL
  Filled 2017-01-13 (×2): qty 1

## 2017-01-13 MED ORDER — MIDAZOLAM HCL 2 MG/2ML IJ SOLN
INTRAMUSCULAR | Status: AC
  Filled 2017-01-13: qty 2

## 2017-01-13 MED ORDER — HYDROMORPHONE HCL 1 MG/ML IJ SOLN
0.2500 mg | INTRAMUSCULAR | Status: DC | PRN
  Administered 2017-01-13 (×2): 0.5 mg via INTRAVENOUS

## 2017-01-13 MED ORDER — HEMOSTATIC AGENTS (NO CHARGE) OPTIME
TOPICAL | Status: DC | PRN
  Administered 2017-01-13: 1 via TOPICAL

## 2017-01-13 MED ORDER — CHLORHEXIDINE GLUCONATE CLOTH 2 % EX PADS: 6.0000 | MEDICATED_PAD | Freq: Once | CUTANEOUS | Status: DC

## 2017-01-13 MED ORDER — PROPOFOL 10 MG/ML IV BOLUS
INTRAVENOUS | Status: AC
  Filled 2017-01-13: qty 40

## 2017-01-13 MED ORDER — ROCURONIUM BROMIDE 10 MG/ML (PF) SYRINGE
PREFILLED_SYRINGE | INTRAVENOUS | Status: AC
  Filled 2017-01-13: qty 5

## 2017-01-13 MED ORDER — PHENYLEPHRINE 40 MCG/ML (10ML) SYRINGE FOR IV PUSH (FOR BLOOD PRESSURE SUPPORT)
PREFILLED_SYRINGE | INTRAVENOUS | Status: AC
  Filled 2017-01-13: qty 10

## 2017-01-13 MED ORDER — DEXAMETHASONE SODIUM PHOSPHATE 10 MG/ML IJ SOLN
10.0000 mg | INTRAMUSCULAR | Status: AC
  Administered 2017-01-13: 10 mg via INTRAVENOUS
  Filled 2017-01-13: qty 1

## 2017-01-13 MED ORDER — FENTANYL CITRATE (PF) 250 MCG/5ML IJ SOLN
INTRAMUSCULAR | Status: AC
  Filled 2017-01-13: qty 5

## 2017-01-13 MED ORDER — SODIUM CHLORIDE 0.9% FLUSH
3.0000 mL | Freq: Two times a day (BID) | INTRAVENOUS | Status: DC
  Administered 2017-01-13 – 2017-01-14 (×4): 3 mL via INTRAVENOUS

## 2017-01-13 MED ORDER — FENTANYL CITRATE (PF) 100 MCG/2ML IJ SOLN
INTRAMUSCULAR | Status: DC | PRN
  Administered 2017-01-13: 25 ug via INTRAVENOUS
  Administered 2017-01-13: 150 ug via INTRAVENOUS
  Administered 2017-01-13: 50 ug via INTRAVENOUS
  Administered 2017-01-13 (×2): 25 ug via INTRAVENOUS

## 2017-01-13 MED ORDER — ONDANSETRON HCL 4 MG/2ML IJ SOLN
INTRAMUSCULAR | Status: DC | PRN
Start: 2017-01-13 — End: 2017-01-13
  Administered 2017-01-13: 4 mg via INTRAVENOUS

## 2017-01-13 MED ORDER — OXYCODONE HCL 5 MG PO TABS
ORAL_TABLET | ORAL | Status: AC
  Filled 2017-01-13: qty 2

## 2017-01-13 MED ORDER — HYDROMORPHONE HCL 1 MG/ML IJ SOLN
INTRAMUSCULAR | Status: AC
  Filled 2017-01-13: qty 1

## 2017-01-13 MED ORDER — PANTOPRAZOLE SODIUM 40 MG IV SOLR: 40.0000 mg | Freq: Every day | INTRAVENOUS | Status: DC

## 2017-01-13 MED ORDER — THROMBIN 5000 UNITS EX SOLR
CUTANEOUS | Status: AC
  Filled 2017-01-13: qty 5000

## 2017-01-13 MED ORDER — SACCHAROMYCES BOULARDII 250 MG PO CAPS
250.0000 mg | ORAL_CAPSULE | Freq: Every day | ORAL | Status: DC
  Administered 2017-01-14 – 2017-01-15 (×2): 250 mg via ORAL
  Filled 2017-01-13 (×2): qty 1

## 2017-01-13 MED ORDER — OXYCODONE HCL 5 MG/5ML PO SOLN: 5.0000 mg | Freq: Once | ORAL | Status: DC | PRN

## 2017-01-13 MED ORDER — OXYCODONE HCL 5 MG PO TABS
5.0000 mg | ORAL_TABLET | ORAL | Status: DC | PRN
  Administered 2017-01-13 – 2017-01-14 (×3): 10 mg via ORAL
  Filled 2017-01-13 (×2): qty 2

## 2017-01-13 MED ORDER — ONDANSETRON HCL 4 MG/2ML IJ SOLN
INTRAMUSCULAR | Status: AC
  Filled 2017-01-13: qty 2

## 2017-01-13 MED ORDER — PROPOFOL 10 MG/ML IV BOLUS
INTRAVENOUS | Status: DC | PRN
  Administered 2017-01-13: 110 mg via INTRAVENOUS
  Administered 2017-01-13: 20 mg via INTRAVENOUS
  Administered 2017-01-13: 10 mg via INTRAVENOUS

## 2017-01-13 MED ORDER — SUGAMMADEX SODIUM 200 MG/2ML IV SOLN
INTRAVENOUS | Status: AC
  Filled 2017-01-13: qty 2

## 2017-01-13 MED ORDER — DIAZEPAM 5 MG/ML IJ SOLN
INTRAMUSCULAR | Status: AC
Start: 2017-01-13 — End: 2017-01-13
  Filled 2017-01-13: qty 2

## 2017-01-13 MED ORDER — DIAZEPAM 5 MG/ML IJ SOLN
2.0000 mg | Freq: Once | INTRAMUSCULAR | Status: AC
  Administered 2017-01-13: 2 mg via INTRAVENOUS

## 2017-01-13 MED ORDER — THROMBIN 5000 UNITS EX SOLR
CUTANEOUS | Status: DC | PRN
  Administered 2017-01-13: 10000 [IU] via TOPICAL

## 2017-01-13 MED ORDER — SODIUM CHLORIDE 0.9 % IR SOLN
Status: DC | PRN
  Administered 2017-01-13: 500 mL

## 2017-01-13 MED ORDER — BACLOFEN 10 MG PO TABS
10.0000 mg | ORAL_TABLET | Freq: Two times a day (BID) | ORAL | Status: DC | PRN
  Administered 2017-01-13 – 2017-01-14 (×2): 10 mg via ORAL
  Filled 2017-01-13 (×2): qty 1

## 2017-01-13 MED ORDER — ARTIFICIAL TEARS OPHTHALMIC OINT
TOPICAL_OINTMENT | OPHTHALMIC | Status: AC
  Filled 2017-01-13: qty 3.5

## 2017-01-13 MED ORDER — CYCLOBENZAPRINE HCL 10 MG PO TABS
ORAL_TABLET | ORAL | Status: AC
  Filled 2017-01-13: qty 1

## 2017-01-13 MED ORDER — PHENOL 1.4 % MT LIQD
1.0000 | OROMUCOSAL | Status: DC | PRN
  Administered 2017-01-14: 1 via OROMUCOSAL
  Filled 2017-01-13: qty 177

## 2017-01-13 MED ORDER — THROMBIN 5000 UNITS EX SOLR
OROMUCOSAL | Status: DC | PRN
  Administered 2017-01-13 (×2): 5 mL via TOPICAL

## 2017-01-13 MED ORDER — EPHEDRINE 5 MG/ML INJ
INTRAVENOUS | Status: AC
  Filled 2017-01-13: qty 10

## 2017-01-13 MED ORDER — SODIUM CHLORIDE 0.9 % IV SOLN
250.0000 mL | INTRAVENOUS | Status: DC
  Administered 2017-01-13: 250 mL via INTRAVENOUS

## 2017-01-13 MED ORDER — ROCURONIUM BROMIDE 100 MG/10ML IV SOLN
INTRAVENOUS | Status: DC | PRN
  Administered 2017-01-13 (×2): 10 mg via INTRAVENOUS
  Administered 2017-01-13: 50 mg via INTRAVENOUS

## 2017-01-13 MED ORDER — ARTIFICIAL TEARS OPHTHALMIC OINT
TOPICAL_OINTMENT | OPHTHALMIC | Status: DC | PRN
  Administered 2017-01-13: 1 via OPHTHALMIC

## 2017-01-13 MED ORDER — DICLOFENAC SODIUM 75 MG PO TBEC
75.0000 mg | DELAYED_RELEASE_TABLET | Freq: Two times a day (BID) | ORAL | Status: DC
  Administered 2017-01-14 – 2017-01-15 (×3): 75 mg via ORAL
  Filled 2017-01-13 (×4): qty 1

## 2017-01-13 MED ORDER — HYDROCODONE-ACETAMINOPHEN 5-325 MG PO TABS
1.0000 | ORAL_TABLET | Freq: Four times a day (QID) | ORAL | Status: DC | PRN
  Administered 2017-01-14 – 2017-01-15 (×5): 1 via ORAL
  Filled 2017-01-13 (×5): qty 1

## 2017-01-13 MED ORDER — LACTATED RINGERS IV SOLN
INTRAVENOUS | Status: DC | PRN
  Administered 2017-01-13 (×2): via INTRAVENOUS

## 2017-01-13 MED ORDER — VENLAFAXINE HCL ER 75 MG PO CP24
150.0000 mg | ORAL_CAPSULE | Freq: Every day | ORAL | Status: DC
  Administered 2017-01-14 – 2017-01-15 (×2): 150 mg via ORAL
  Filled 2017-01-13 (×2): qty 2

## 2017-01-13 MED ORDER — OXYCODONE HCL 5 MG PO TABS: 5.0000 mg | ORAL_TABLET | Freq: Once | ORAL | Status: DC | PRN

## 2017-01-13 SURGICAL SUPPLY — 69 items
ADH SKN CLS APL DERMABOND .7 (GAUZE/BANDAGES/DRESSINGS)
APL SKNCLS STERI-STRIP NONHPOA (GAUZE/BANDAGES/DRESSINGS) ×1
BAG DECANTER FOR FLEXI CONT (MISCELLANEOUS) ×3 IMPLANT
BENZOIN TINCTURE PRP APPL 2/3 (GAUZE/BANDAGES/DRESSINGS) ×3 IMPLANT
BIT DRILL SM SPINE QC 14 (BIT) ×3 IMPLANT
BONE VIVIGEN FORMABLE 1.3CC (Bone Implant) ×6 IMPLANT
BUR MATCHSTICK NEURO 3.0 LAGG (BURR) ×3 IMPLANT
CANISTER SUCT 3000ML PPV (MISCELLANEOUS) ×3 IMPLANT
CARTRIDGE OIL MAESTRO DRILL (MISCELLANEOUS) ×1 IMPLANT
CLOSURE WOUND 1/2 X4 (GAUZE/BANDAGES/DRESSINGS) ×1
DERMABOND ADVANCED (GAUZE/BANDAGES/DRESSINGS)
DERMABOND ADVANCED .7 DNX12 (GAUZE/BANDAGES/DRESSINGS) IMPLANT
DIFFUSER DRILL AIR PNEUMATIC (MISCELLANEOUS) ×3 IMPLANT
DRAIN SNY WOU 7FLT (WOUND CARE) ×3 IMPLANT
DRAPE C-ARM 42X72 X-RAY (DRAPES) ×6 IMPLANT
DRAPE LAPAROTOMY 100X72 PEDS (DRAPES) ×3 IMPLANT
DRAPE MICROSCOPE LEICA (MISCELLANEOUS) ×3 IMPLANT
DRAPE POUCH INSTRU U-SHP 10X18 (DRAPES) ×3 IMPLANT
DRSG OPSITE POSTOP 4X6 (GAUZE/BANDAGES/DRESSINGS) ×3 IMPLANT
DURAPREP 6ML APPLICATOR 50/CS (WOUND CARE) ×3 IMPLANT
ELECT COATED BLADE 2.86 ST (ELECTRODE) ×3 IMPLANT
ELECT REM PT RETURN 9FT ADLT (ELECTROSURGICAL) ×3
ELECTRODE REM PT RTRN 9FT ADLT (ELECTROSURGICAL) ×1 IMPLANT
EVACUATOR SILICONE 100CC (DRAIN) ×3 IMPLANT
GAUZE SPONGE 4X4 12PLY STRL (GAUZE/BANDAGES/DRESSINGS) ×3 IMPLANT
GAUZE SPONGE 4X4 16PLY XRAY LF (GAUZE/BANDAGES/DRESSINGS) IMPLANT
GLOVE BIO SURGEON STRL SZ7 (GLOVE) ×6 IMPLANT
GLOVE BIO SURGEON STRL SZ8 (GLOVE) ×3 IMPLANT
GLOVE BIOGEL PI IND STRL 7.0 (GLOVE) IMPLANT
GLOVE BIOGEL PI IND STRL 7.5 (GLOVE) ×1 IMPLANT
GLOVE BIOGEL PI INDICATOR 7.0 (GLOVE)
GLOVE BIOGEL PI INDICATOR 7.5 (GLOVE) ×2
GLOVE EXAM NITRILE LRG STRL (GLOVE) IMPLANT
GLOVE EXAM NITRILE XL STR (GLOVE) IMPLANT
GLOVE EXAM NITRILE XS STR PU (GLOVE) IMPLANT
GLOVE INDICATOR 8.5 STRL (GLOVE) ×3 IMPLANT
GLOVE SS BIOGEL STRL SZ 7 (GLOVE) ×1 IMPLANT
GLOVE SUPERSENSE BIOGEL SZ 7 (GLOVE) ×2
GOWN STRL REUS W/ TWL LRG LVL3 (GOWN DISPOSABLE) IMPLANT
GOWN STRL REUS W/ TWL XL LVL3 (GOWN DISPOSABLE) ×1 IMPLANT
GOWN STRL REUS W/TWL 2XL LVL3 (GOWN DISPOSABLE) IMPLANT
GOWN STRL REUS W/TWL LRG LVL3 (GOWN DISPOSABLE)
GOWN STRL REUS W/TWL XL LVL3 (GOWN DISPOSABLE) ×3
HALTER HD/CHIN CERV TRACTION D (MISCELLANEOUS) ×3 IMPLANT
HEMOSTAT POWDER KIT SURGIFOAM (HEMOSTASIS) ×3 IMPLANT
KIT BASIN OR (CUSTOM PROCEDURE TRAY) ×3 IMPLANT
KIT ROOM TURNOVER OR (KITS) ×3 IMPLANT
NEEDLE SPNL 20GX3.5 QUINCKE YW (NEEDLE) ×3 IMPLANT
NS IRRIG 1000ML POUR BTL (IV SOLUTION) ×3 IMPLANT
OIL CARTRIDGE MAESTRO DRILL (MISCELLANEOUS) ×3
PACK LAMINECTOMY NEURO (CUSTOM PROCEDURE TRAY) ×3 IMPLANT
PAD ARMBOARD 7.5X6 YLW CONV (MISCELLANEOUS) ×9 IMPLANT
PLATE ANT CERV XTEND 3 LV 45 (Plate) ×3 IMPLANT
RUBBERBAND STERILE (MISCELLANEOUS) ×6 IMPLANT
SCREW XTD VAR 4.2 SELF TAP (Screw) ×24 IMPLANT
SPACER CERVICAL 11X14MM 6MM 0D (Spacer) ×3 IMPLANT
SPACER CERVICAL 11X14MM 7MM 0D (Spacer) ×3 IMPLANT
SPACER CERVICAL 11X14MM 9MM 5D (Spacer) ×3 IMPLANT
SPONGE INTESTINAL PEANUT (DISPOSABLE) ×3 IMPLANT
SPONGE SURGIFOAM ABS GEL SZ50 (HEMOSTASIS) ×6 IMPLANT
STRIP CLOSURE SKIN 1/2X4 (GAUZE/BANDAGES/DRESSINGS) ×2 IMPLANT
SUT VIC AB 3-0 SH 8-18 (SUTURE) ×3 IMPLANT
SUT VICRYL 4-0 PS2 18IN ABS (SUTURE) ×3 IMPLANT
TAPE CLOTH 4X10 WHT NS (GAUZE/BANDAGES/DRESSINGS) IMPLANT
TOWEL GREEN STERILE (TOWEL DISPOSABLE) ×3 IMPLANT
TOWEL GREEN STERILE FF (TOWEL DISPOSABLE) ×3 IMPLANT
TRAP SPECIMEN MUCOUS 40CC (MISCELLANEOUS) ×3 IMPLANT
TRAY FOLEY W/METER SILVER 16FR (SET/KITS/TRAYS/PACK) ×3 IMPLANT
WATER STERILE IRR 1000ML POUR (IV SOLUTION) ×3 IMPLANT

## 2017-01-13 NOTE — Progress Notes (Signed)
Pt received from PACU stable, with no noted distress. Surgical honeycomb dressing dry and intact. JP drain in place. Pt positioned for comfort. Pt oriented to room. Safety measures in place. Call bell within reach. Will continue to monitor. Received report prior to arrival from [place].

## 2017-01-13 NOTE — H&P (Signed)
Tonya Pope is an 66 y.o. female.   Chief Complaint: Right arm pain HPI: 66 year female is a long-standing neck pain with radiation to her right arm consistent with C5 and C6 nerve root pattern. 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 I extensively went over the risks and benefits of the procedure with her as well as perioperative course expectations of outcome and alternatives of surgery and she understands and agrees to proceed forward.  Past Medical History:  Diagnosis Date  . Acne   . Anemia   . Anxiety   . Arthritis   . Colon polyps   . Depression   . Diarrhea    C. Diff  . Difficult intubation    patient stated "had to use a smaller tube" in previous surgery in 80's  . GERD (gastroesophageal reflux disease)   . Hyperlipidemia   . Insomnia   . Migraine   . OP (osteoporosis)   . Pneumonia     Past Surgical History:  Procedure Laterality Date  . ABDOMINAL HYSTERECTOMY    . CARPAL TUNNEL RELEASE Left 11/15/2016   Procedure: LEFT CARPAL TUNNEL RELEASE;  Surgeon: Cindee Salt, MD;  Location: [facility];  Service: Orthopedics;  Laterality: Left;  . CARPAL TUNNEL RELEASE Right 08/18/2016  . COLONOSCOPY W/ POLYPECTOMY    . DIAGNOSTIC LAPAROSCOPY     for endometriosis  . ENDOMETRIAL ABLATION    . TONSILLECTOMY    . TONSILLECTOMY AND ADENOIDECTOMY      Family History  Problem Relation Age of Onset  . Stroke Mother   . Arthritis Mother   . Diabetes Mother   . Heart disease Mother   . Hypertension Mother   . Hyperlipidemia Father   . Glaucoma Father   . Thyroid disease Sister   . Breast cancer Maternal Aunt   . Colon cancer Maternal Grandfather   . Depression Sister   . Colon cancer Paternal Grandmother    Social History:  reports that she has quit smoking. She has never used smokeless tobacco. She reports that she drinks about 7.2 - 8.4 oz of alcohol per week . She reports that she does not use drugs.  Allergies:   Allergies  Allergen Reactions  . Epinephrine Other (See Comments)    Pass out / dizzy  . Promethazine Hcl Other (See Comments)    UNSPECIFIED CNS EFFECTS  . Codeine Phosphate Nausea Only  . Penicillins Rash    Medications Prior to Admission  Medication Sig Dispense Refill  . baclofen (LIORESAL) 10 MG tablet Take 10 mg by mouth 2 (two) times daily as needed for muscle spasms.     Marland Kitchen conjugated estrogens (PREMARIN) vaginal cream Place 0.5 Applicatorfuls vaginally daily. (Patient taking differently: Place 1 g vaginally 2 (two) times a week. ) 42.5 g 11  . diclofenac (VOLTAREN) 75 MG EC tablet Take 75 mg by mouth 2 (two) times daily.    Marland Kitchen HYDROcodone-acetaminophen (NORCO) 5-325 MG tablet Take 1 tablet by mouth every 6 (six) hours as needed for moderate pain. 25 tablet 0  . pantoprazole (PROTONIX) 40 MG tablet TAKE 1 TABLET(40 MG) BY MOUTH DAILY 90 tablet 1  . saccharomyces boulardii (FLORASTOR) 250 MG capsule Take 1 capsule (250 mg total) by mouth 2 (two) times daily. (Patient taking differently: Take 250 mg by mouth daily. ) 60 capsule 0  . tiZANidine (ZANAFLEX) 4 MG tablet Take 1 tablet (4 mg total) by mouth every 6 (six) hours as needed for  muscle spasms. (Patient taking differently: Take 4 mg by mouth at bedtime. ) 90 tablet 3  . venlafaxine XR (EFFEXOR-XR) 150 MG 24 hr capsule TAKE 1 CAPSULE BY MOUTH DAILY WITH BREAKFAST 90 capsule 1    No results found for this or any previous visit (from the past 48 hour(s)). No results found.  Review of Systems  Musculoskeletal: Positive for joint pain and neck pain.  Neurological: Positive for tingling and sensory change.    Blood pressure 127/61, pulse 74, temperature 98.3 F (36.8 C), resp. rate 18, height 5\' 1"  (1.549 m), weight 57.1 kg (125 lb 14.4 oz), SpO2 99 %. Physical Exam  Constitutional: She is oriented to person, place, and time. She appears well-developed and well-nourished.  HENT:  Head: Normocephalic.  Eyes: Pupils are equal,  round, and reactive to light.  Neck: Normal range of motion.  Cardiovascular: Normal rate.   Respiratory: Effort normal.  GI: Soft. Bowel sounds are normal.  Musculoskeletal: Normal range of motion.  Neurological: She is alert and oriented to person, place, and time.     Assessment/Plan 66 year old female presents for an ACDF at C4-5, C5-6, C6-7.  Ishana Blades P, MD 01/13/2017, 7:09 AM

## 2017-01-13 NOTE — Anesthesia Postprocedure Evaluation (Signed)
Anesthesia Post Note  Patient: Tonya Pope  Procedure(s) Performed: Procedure(s) (LRB): ACDF - C4-C5 - C5-C6 (N/A)     Patient location during evaluation: PACU Anesthesia Type: General Level of consciousness: awake and alert Pain management: pain level controlled Vital Signs Assessment: post-procedure vital signs reviewed and stable Respiratory status: spontaneous breathing, nonlabored ventilation, respiratory function stable and patient connected to nasal cannula oxygen Cardiovascular status: blood pressure returned to baseline and stable Postop Assessment: no signs of nausea or vomiting Anesthetic complications: no    Last Vitals:  Vitals:   01/13/17 1511 01/13/17 1822  BP: (!) 143/69 104/88  Pulse: (!) 101 (!) 105  Resp: 16 16  Temp: 36.5 C 36.6 C    Last Pain:  Vitals:   01/13/17 2032  TempSrc:   PainSc: 8                  Hayward Rylander P Tanyah Debruyne

## 2017-01-13 NOTE — Anesthesia Preprocedure Evaluation (Addendum)
Anesthesia Evaluation  Patient identified by MRN, date of birth, ID band Patient awake    Reviewed: Allergy & Precautions, NPO status , Patient's Chart, lab work & pertinent test results  History of Anesthesia Complications (+) DIFFICULT AIRWAY and history of anesthetic complications  Airway Mallampati: IV   Neck ROM: Full    Dental no notable dental hx.    Pulmonary former smoker,    Pulmonary exam normal breath sounds clear to auscultation       Cardiovascular negative cardio ROS Normal cardiovascular exam     Neuro/Psych  Headaches, Anxiety Depression    GI/Hepatic Neg liver ROS, GERD  Medicated and Controlled,  Endo/Other  negative endocrine ROS  Renal/GU negative Renal ROS  negative genitourinary   Musculoskeletal negative musculoskeletal ROS (+)   Abdominal   Peds negative pediatric ROS (+)  Hematology negative hematology ROS (+)   Anesthesia Other Findings Hyperlipidemia   Reproductive/Obstetrics negative OB ROS                            Anesthesia Physical  Anesthesia Plan  ASA: II  Anesthesia Plan: General   Post-op Pain Management:    Induction: Intravenous  PONV Risk Score and Plan: 3 and Ondansetron, Dexamethasone, Propofol and Midazolam  Airway Management Planned: Oral ETT and Video Laryngoscope Planned  Additional Equipment:   Intra-op Plan:   Post-operative Plan: Extubation in OR  Informed Consent: I have reviewed the patients History and Physical, chart, labs and discussed the procedure including the risks, benefits and alternatives for the proposed anesthesia with the patient or authorized representative who has indicated his/her understanding and acceptance.   Dental advisory given  Plan Discussed with: CRNA  Anesthesia Plan Comments:      Anesthesia Quick Evaluation

## 2017-01-13 NOTE — Anesthesia Procedure Notes (Signed)
Procedure Name: Intubation Date/Time: 01/13/2017 7:45 AM Performed by: Edmonia CaprioAUSTON, Tonya Sison M Pre-anesthesia Checklist: Emergency Drugs available, Patient identified, Suction available, Patient being monitored and Timeout performed Patient Re-evaluated:Patient Re-evaluated prior to inductionOxygen Delivery Method: Circle system utilized Preoxygenation: Pre-oxygenation with 100% oxygen Intubation Type: IV induction Ventilation: Mask ventilation without difficulty Laryngoscope Size: Glidescope and 3 Grade View: Grade I Tube type: Oral Tube size: 7.0 mm Number of attempts: 1 Airway Equipment and Method: Stylet and Video-laryngoscopy Placement Confirmation: ETT inserted through vocal cords under direct vision,  breath sounds checked- equal and bilateral and positive ETCO2 Secured at: 22 cm Tube secured with: Tape Dental Injury: Teeth and Oropharynx as per pre-operative assessment

## 2017-01-13 NOTE — Op Note (Signed)
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  Surgeon: Jillyn Hidden Tiombe Tomeo  Assistant: Sharlet Salina ditty  Anesthesia: Gen.  EBL: Middle  History of present illness: Patient is a very pleasant 52 show female is a progress worsening neck and right shoulder and arm pain rating down to her ankle consistent with both C5 and C6 nerve root pattern workup revealed severe cervical spondylosis and stenosis at C4-5 C5-6 and C6-7 segment due to her failure conservative treatment imaging findings and progressive conical syndrome I recommended anterior cervical discectomy and fusion at all 3 of those levels. I extensively reviewed the risks and benefits of the operation with the patient as well as perioperative course expectations of outcome and alternatives of surgery and she understood and agreed to proceed forward.  Operative procedure: Patient brought into the or was induced on general anesthesia positioned supine the neck in slight extension in 5 pounds of halter traction the right side of her neck was prepped and draped in routine sterile fashion preoperative x-ray localized the appropriate level so a curvilinear incision was made just off midline to the anterior border of the sternomastoid and the superficial layer of the test was dissected and divided longitudinally. Then the avascular plane to sternomastoid and strap muscles was developed down to the prevertebral fascia. Fascia facet with Kitners. Interoperative x-ray confirmed good condition proper level so the longus close reflected laterally and self-retaining retractors were placed. Large anterior ossified 2 bitten off with Leksell rongeur all 3 disc spaces were calcified calcified part of the disc was also removed with a 2 and 3 mm Kerrison punch. All 3 disc spaces were  then drilled down the posterior annulus and osteo-complex. First working at C6-7 under the operating microscope was draped and brought into the field and Mike's cup illumination disc space was further drilled down posterior annulus was removed with a 2 mm Kerrison punch under biting both endplates allowed identification of posterior longitudinal ligament which was removed in piecemeal fashion. There was a dense amount stenosis from osteophyte this is all aggressively removed by under biting both the C6-C7 vertebral bodies and marching out and decompressing both C7 nerve roots. After after adequate discectomy foraminotomies were achieved attention was then taken at C3-4-1 similar fashion under microscopic relation C3-4 was cleaned out large spur as well as some soft disc material to the right of the canal was causing the most compression this is all removed both C4 pedicles were identified both C4 nerve roots decompressed. Aggressive undergoing of both endplates decompress the central canal as well. This was packed with Gelfoam and attention taken C5-6 and a similar fashion C5-6 was drilled down mostly spondylitic aggressively under biting both endplates and marching out laterally both foramina were opened up and both C6 nerve roots decompressed. Then after all 3 disc spaces had been cleaned out and copious irrigated meticulous hemostasis was maintained I inserted a 6 mm parallel wedge at C4-5-1 7 mm lordotic at C5-6 and a 9 mm lordotic at C6-7. All implants went very well the plate was then sized up I selected 45 mm globus extend plate all screws excellent purchase locking mechanism was gauged the wound scope was irrigated meticulous hemostasis was maintained a drain was placed the wounds closed in layers with after Vicryl the skin was closed running 4 subcuticular Dermabond benzo and Steri-Strips and a sterile dressing  was applied patient recovered in stable condition. At the end of case all needle counts and  sponge counts were correct.

## 2017-01-13 NOTE — Transfer of Care (Signed)
Immediate Anesthesia Transfer of Care Note  Patient: Tonya Pope  Procedure(s) Performed: Procedure(s): ACDF - C4-C5 - C5-C6 (N/A)  Patient Location: PACU  Anesthesia Type:General  Level of Consciousness: awake, alert  and oriented  Airway & Oxygen Therapy: Patient Spontanous Breathing and Patient connected to nasal cannula oxygen  Post-op Assessment: Report given to RN, Post -op Vital signs reviewed and stable and Patient moving all extremities X 4  Post vital signs: Reviewed and stable  Last Vitals:  Vitals:   01/13/17 0629 01/13/17 1045  BP:  (!) 154/80  Pulse:  (!) 103  Resp:  16  Temp: 36.8 C 36.6 C    Last Pain: There were no vitals filed for this visit.       Complications: No apparent anesthesia complications

## 2017-01-14 MED ORDER — BACLOFEN 10 MG PO TABS
20.0000 mg | ORAL_TABLET | Freq: Three times a day (TID) | ORAL | Status: DC | PRN
  Administered 2017-01-14 – 2017-01-15 (×3): 20 mg via ORAL
  Filled 2017-01-14 (×3): qty 2

## 2017-01-14 NOTE — Progress Notes (Signed)
Vitals:   01/13/17 1822 01/13/17 2103 01/14/17 0258 01/14/17 0700  BP: 104/88 132/68 129/65 (!) 148/84  Pulse: (!) 105 74 65 77  Resp: 16 16 17 18   Temp: 97.8 F (36.6 C) 97.9 F (36.6 C) 97.5 F (36.4 C) 98.2 F (36.8 C)  TempSrc: Oral Oral Oral Oral  SpO2: 94% 92% 96% 95%  Weight:      Height:        Patient sitting up in bed, significant pain and muscular spasm in right trapezius region. Being given baclofen, but she normally takes 20 mg up to 3 times a day at home; orders adjusted. Ambulating in the halls some, encouraged to increase. JP drain has backed out and no longer holding and charge. Drain removed, and dressing applied.  Plan: Encouraged to ambulate at least 6 times today in the hallways. Continue to progress through postoperative recovery.  Hewitt ShortsNUDELMAN,ROBERT W, MD 01/14/2017, 9:12 AM

## 2017-01-14 NOTE — Progress Notes (Signed)
Pt has had uneventful day. OOB to bathroom and sat up in chair for an extended period of time. No changes in neurological assessment.Family was at bedside today as well. Cont with plan of care

## 2017-01-15 MED ORDER — DOCUSATE SODIUM 100 MG PO CAPS
100.0000 mg | ORAL_CAPSULE | Freq: Once | ORAL | Status: AC
  Administered 2017-01-15: 100 mg via ORAL
  Filled 2017-01-15: qty 1

## 2017-01-15 MED ORDER — BACLOFEN 10 MG PO TABS: 10.0000 mg | ORAL_TABLET | Freq: Two times a day (BID) | ORAL | 1 refills | Status: DC | PRN

## 2017-01-15 MED ORDER — HYDROCODONE-ACETAMINOPHEN 5-325 MG PO TABS: 1.0000 | ORAL_TABLET | Freq: Four times a day (QID) | ORAL | 0 refills | Status: DC | PRN

## 2017-01-15 NOTE — Progress Notes (Signed)
IV  Infiltrated. Removed, catheter intact. Applied gauze dressing.  Patient may discharge in the am. No new IV placed at this time.

## 2017-01-15 NOTE — Discharge Summary (Addendum)
Physician Discharge Summary  Patient ID: MODELLE VOLLMER MRN: [id number] DOB/AGE: Dec 10, 1950 66 y.o.  Admit date: 01/13/2017 Discharge date: 01/15/2017  Admission Diagnoses:  Cervical spinal stenosis  Discharge Diagnoses:  Same Active Problems:   Spinal stenosis, cervical region  Discharged Condition: Stable  Hospital Course:  Tonya Pope is a 66 y.o. female who was admitted for the below procedure. There were no post operative complications. At time of discharge, pain was well controlled, tolerating po, voiding normal. Ready for discharge.  Treatments: Surgery - Anterior cervical discectomies and fusion at C4-5 C5-6 C6-7 utilizing allograft wedges and the globus extend plating system.  Discharge Exam: Blood pressure (!) 114/57, pulse 80, temperature 98.1 F (36.7 C), temperature source Oral, resp. rate 20, height 5\' 1"  (1.549 m), weight 57.1 kg (125 lb 14.4 oz), SpO2 98 %. Awake, alert, oriented Speech fluent, appropriate CN grossly intact 5/5 BUE/BLE Wound c/d/i  Disposition: 01-Home or Self Care  Discharge Instructions    Call MD for:  difficulty breathing, headache or visual disturbances    Complete by:  As directed    Call MD for:  persistant dizziness or light-headedness    Complete by:  As directed    Call MD for:  redness, tenderness, or signs of infection (pain, swelling, redness, odor or green/yellow discharge around incision site)    Complete by:  As directed    Call MD for:  severe uncontrolled pain    Complete by:  As directed    Call MD for:  temperature >100.4    Complete by:  As directed    Diet general    Complete by:  As directed    Driving Restrictions    Complete by:  As directed    Do not drive until given clearance.   Increase activity slowly    Complete by:  As directed    Lifting restrictions    Complete by:  As directed    Do not lift anything >10lbs. Avoid bending and twisting in awkward positions. Avoid bending at the back.   May  shower / Bathe    Complete by:  As directed    In 24 hours. Okay to wash wound with warm soapy water. Avoid scrubbing the wound. Pat dry.   Remove dressing in 24 hours    Complete by:  As directed      Allergies as of 01/15/2017      Reactions   Epinephrine Other (See Comments)   Pass out / dizzy   Promethazine Hcl Other (See Comments)   UNSPECIFIED CNS EFFECTS   Codeine Phosphate Nausea Only   Penicillins Rash      Medication List    TAKE these medications   baclofen 10 MG tablet Commonly known as:  LIORESAL Take 1 tablet (10 mg total) by mouth 2 (two) times daily as needed for muscle spasms.   conjugated estrogens vaginal cream Commonly known as:  PREMARIN Place 0.5 Applicatorfuls vaginally daily. What changed:  when to take this   diclofenac 75 MG EC tablet Commonly known as:  VOLTAREN Take 75 mg by mouth 2 (two) times daily.   HYDROcodone-acetaminophen 5-325 MG tablet Commonly known as:  NORCO Take 1-2 tablets by mouth every 6 (six) hours as needed for moderate pain. What changed:  how much to take   pantoprazole 40 MG tablet Commonly known as:  PROTONIX TAKE 1 TABLET(40 MG) BY MOUTH DAILY   saccharomyces boulardii 250 MG capsule Commonly known as:  FLORASTOR Take 1  capsule (250 mg total) by mouth 2 (two) times daily. What changed:  when to take this   tiZANidine 4 MG tablet Commonly known as:  ZANAFLEX Take 1 tablet (4 mg total) by mouth every 6 (six) hours as needed for muscle spasms. What changed:  when to take this   venlafaxine XR 150 MG 24 hr capsule Commonly known as:  EFFEXOR-XR TAKE 1 CAPSULE BY MOUTH DAILY WITH BREAKFAST      Follow-up Information    Donalee Citrinram, Gary, MD Follow up.   Specialty:  Neurosurgery Contact information: [address] [place] [address] [phone]           Signed: Alyson InglesCOSTELLA, VINCENT J 01/15/2017, 9:03 AM

## 2017-01-15 NOTE — Care Management Note (Signed)
Case Management Note  Patient Details  Name: Tonya Pope MRN: [name] Date of Birth: 12/01/1950  Subjective/Objective:                 Patient with order toDC to home. Chart reviewed, no CM needs identified.    Action/Plan:  DC to home self care.  Expected Discharge Date:  01/15/17               Expected Discharge Plan:  Home/Self Care  In-House Referral:     Discharge planning Services  CM Consult  Post Acute Care Choice:    Choice offered to:     DME Arranged:    DME Agency:     HH Arranged:    HH Agency:     Status of Service:  Completed, signed off  If discussed at [employer] Length of Stay Meetings, dates discussed:    Additional Comments:  Lawerance SabalDebbie Gennette Shadix, RN 01/15/2017, 8:27 AM

## 2017-01-15 NOTE — Progress Notes (Signed)
Patient ready for discharge to home; discharge instructions given and reviewed; Rx given; patient dressed and ready for discharge; patient discharged out via wheelchair accompanied by family member.

## 2017-01-16 ENCOUNTER — Encounter (HOSPITAL_COMMUNITY): Payer: Self-pay | Admitting: Neurosurgery

## 2017-01-16 MED FILL — Thrombin For Soln 5000 Unit: CUTANEOUS | Qty: 5000 | Status: AC

## 2017-01-16 NOTE — Addendum Note (Signed)
Addendum  created 01/16/17 1447 by Val EagleMoser, Eunice Oldaker, MD   Sign clinical note, SmartForm saved

## 2017-01-19 ENCOUNTER — Encounter: Payer: Medicare Other | Admitting: Internal Medicine

## 2017-02-08 DIAGNOSIS — M47816 Spondylosis without myelopathy or radiculopathy, lumbar region: Secondary | ICD-10-CM | POA: Diagnosis not present

## 2017-02-14 DIAGNOSIS — M542 Cervicalgia: Secondary | ICD-10-CM | POA: Diagnosis not present

## 2017-02-14 DIAGNOSIS — R030 Elevated blood-pressure reading, without diagnosis of hypertension: Secondary | ICD-10-CM | POA: Diagnosis not present

## 2017-02-17 ENCOUNTER — Encounter: Payer: Medicare Other | Admitting: Internal Medicine

## 2017-02-26 ENCOUNTER — Other Ambulatory Visit: Payer: Self-pay | Admitting: Adult Health

## 2017-02-26 DIAGNOSIS — K2100 Gastro-esophageal reflux disease with esophagitis, without bleeding: Secondary | ICD-10-CM

## 2017-02-26 DIAGNOSIS — Z760 Encounter for issue of repeat prescription: Secondary | ICD-10-CM

## 2017-03-07 ENCOUNTER — Encounter: Payer: Self-pay | Admitting: Internal Medicine

## 2017-03-07 ENCOUNTER — Ambulatory Visit (AMBULATORY_SURGERY_CENTER): Payer: Medicare Other | Admitting: Internal Medicine

## 2017-03-07 VITALS — BP 129/69 | HR 73 | Temp 98.6°F | Resp 10 | Ht 61.0 in | Wt 129.0 lb

## 2017-03-07 DIAGNOSIS — R197 Diarrhea, unspecified: Secondary | ICD-10-CM | POA: Diagnosis not present

## 2017-03-07 DIAGNOSIS — Z86010 Personal history of colonic polyps: Secondary | ICD-10-CM | POA: Diagnosis not present

## 2017-03-07 DIAGNOSIS — Z1211 Encounter for screening for malignant neoplasm of colon: Secondary | ICD-10-CM | POA: Diagnosis not present

## 2017-03-07 MED ORDER — SODIUM CHLORIDE 0.9 % IV SOLN: 500.0000 mL | INTRAVENOUS | Status: AC

## 2017-03-07 NOTE — Patient Instructions (Signed)
YOU HAD AN ENDOSCOPIC PROCEDURE TODAY AT THE [facility]:   Refer to the procedure report that was given to you for any specific questions about what was found during the examination.  If the procedure report does not answer your questions, please call your gastroenterologist to clarify.  If you requested that your care partner not be given the details of your procedure findings, then the procedure report has been included in a sealed envelope for you to review at your convenience later.  YOU SHOULD EXPECT: Some feelings of bloating in the abdomen. Passage of more gas than usual.  Walking can help get rid of the air that was put into your GI tract during the procedure and reduce the bloating. If you had a lower endoscopy (such as a colonoscopy or flexible sigmoidoscopy) you may notice spotting of blood in your stool or on the toilet paper. If you underwent a bowel prep for your procedure, you may not have a normal bowel movement for a few days.  Please Note:  You might notice some irritation and congestion in your nose or some drainage.  This is from the oxygen used during your procedure.  There is no need for concern and it should clear up in a day or so.  SYMPTOMS TO REPORT IMMEDIATELY:   Following lower endoscopy (colonoscopy or flexible sigmoidoscopy):  Excessive amounts of blood in the stool  Significant tenderness or worsening of abdominal pains  Swelling of the abdomen that is new, acute  Fever of 100F or higher   For urgent or emergent issues, a gastroenterologist can be reached at any hour by calling [phone].   DIET:  We do recommend a small meal at first, but then you may proceed to your regular diet.  Drink plenty of fluids but you should avoid alcoholic beverages for 24 hours.  ACTIVITY:  You should plan to take it easy for the rest of today and you should NOT DRIVE or use heavy machinery until tomorrow (because of the sedation medicines used during the test).     FOLLOW UP: Our staff will call the number listed on your records the next business day following your procedure to check on you and address any questions or concerns that you may have regarding the information given to you following your procedure. If we do not reach you, we will leave a message.  However, if you are feeling well and you are not experiencing any problems, there is no need to return our call.  We will assume that you have returned to your regular daily activities without incident.  If any biopsies were taken you will be contacted by phone or by letter within the next 1-3 weeks.  Please call us at [phone] if you have not heard about the biopsies in 3 weeks.    SIGNATURES/CONFIDENTIALITY: You and/or your care partner have signed paperwork which will be entered into your electronic medical record.  These signatures attest to the fact that that the information above on your After Visit Summary has been reviewed and is understood.  Full responsibility of the confidentiality of this discharge information lies with you and/or your care-partner.  Await pathology report.  Recall colonoscopy 10 years-2028

## 2017-03-07 NOTE — Progress Notes (Signed)
Patient with cervical fusion in June, 2018. Patient has a soft cervical collar that she uses when sleeping.

## 2017-03-07 NOTE — Op Note (Signed)
[facility] Patient Name: Tonya Pope Procedure Date: 03/07/2017 8:38 AM MRN: [id number] Endoscopist: Wilhemina Bonito. Marina Goodell , MD Age: 66 Referring MD:  Date of Birth: June 30, 1951 Gender: Female Account #: [id number] Procedure:                Colonoscopy, with biopsies Indications:              High risk colon cancer surveillance: Personal                            history of non-advanced adenoma, Incidental                            diarrhea noted. Previous examinations 1996, 2001                            (tubular adenoma), 2008 (negative) Seen in the                            office in May for 6 weeks diarrhea. Negative                            workup.Marland Kitchen Course of vancomycin. Better Medicines:                Monitored Anesthesia Care Procedure:                Pre-Anesthesia Assessment:                           - Prior to the procedure, a History and Physical                            was performed, and patient medications and                            allergies were reviewed. The patient's tolerance of                            previous anesthesia was also reviewed. The risks                            and benefits of the procedure and the sedation                            options and risks were discussed with the patient.                            All questions were answered, and informed consent                            was obtained. Prior Anticoagulants: The patient has                            taken no previous anticoagulant or antiplatelet  agents. ASA Grade Assessment: II - A patient with                            mild systemic disease. After reviewing the risks                            and benefits, the patient was deemed in                            satisfactory condition to undergo the procedure.                           After obtaining informed consent, the colonoscope                            was passed under direct  vision. Throughout the                            procedure, the patient's blood pressure, pulse, and                            oxygen saturations were monitored continuously. The                            Colonoscope was introduced through the anus and                            advanced to the the cecum, identified by                            appendiceal orifice and ileocecal valve. The                            ileocecal valve, appendiceal orifice, and rectum                            were photographed. The quality of the bowel                            preparation was excellent. The colonoscopy was                            performed without difficulty. The patient tolerated                            the procedure well. The bowel preparation used was                            SUPREP. Scope In: 8:56:04 AM Scope Out: 9:12:53 AM Scope Withdrawal Time: 0 hours 12 minutes 1 second  Total Procedure Duration: 0 hours 16 minutes 49 seconds  Findings:                 The entire examined colon appeared normal on direct  and retroflexion views. Biopsies for histology were                            taken with a cold forceps from the entire colon for                            evaluation of microscopic colitis. Complications:            No immediate complications. Estimated blood loss:                            None. Estimated Blood Loss:     Estimated blood loss: none. Impression:               - The entire examined colon is normal on direct and                            retroflexion views. Recommendation:           - Repeat colonoscopy in 10 years for surveillance.                           - Patient has a contact number available for                            emergencies. The signs and symptoms of potential                            delayed complications were discussed with the                            patient. Return to normal activities tomorrow.                             Written discharge instructions were provided to the                            patient.                           - Resume previous diet.                           - Continue present medications.                           - Await pathology results. Wilhemina Bonito. Marina Goodell, MD 03/07/2017 9:17:46 AM This report has been signed electronically.

## 2017-03-07 NOTE — Progress Notes (Signed)
Called to room to assist during endoscopic procedure.  Patient ID and intended procedure confirmed with present staff. Received instructions for my participation in the procedure from the performing physician.  

## 2017-03-07 NOTE — Progress Notes (Signed)
To PACU VSS. Report to RN.tb 

## 2017-03-08 ENCOUNTER — Telehealth: Payer: Self-pay

## 2017-03-08 NOTE — Telephone Encounter (Signed)
  Follow up Call-  Call back number 03/07/2017  Post procedure Call Back phone  # [phone]  Permission to leave phone message Yes  Some recent data might be hidden     Patient questions:  Do you have a fever, pain , or abdominal swelling? No. Pain Score  0 *  Have you tolerated food without any problems? Yes.    Have you been able to return to your normal activities? Yes.    Do you have any questions about your discharge instructions: Diet   No. Medications  No. Follow up visit  No.  Do you have questions or concerns about your Care? No.  Actions: * If pain score is 4 or above: No action needed, pain <4.

## 2017-03-13 ENCOUNTER — Encounter: Payer: Self-pay | Admitting: Adult Health

## 2017-03-13 ENCOUNTER — Encounter: Payer: Self-pay | Admitting: Internal Medicine

## 2017-03-14 DIAGNOSIS — M542 Cervicalgia: Secondary | ICD-10-CM | POA: Diagnosis not present

## 2017-04-05 ENCOUNTER — Encounter: Payer: Self-pay | Admitting: Adult Health

## 2017-04-06 ENCOUNTER — Other Ambulatory Visit: Payer: Self-pay | Admitting: Adult Health

## 2017-04-06 MED ORDER — VENLAFAXINE HCL ER 37.5 MG PO CP24: 37.5000 mg | ORAL_CAPSULE | Freq: Every day | ORAL | 1 refills | Status: DC

## 2017-04-07 ENCOUNTER — Encounter: Payer: Self-pay | Admitting: Adult Health

## 2017-04-17 DIAGNOSIS — Z23 Encounter for immunization: Secondary | ICD-10-CM | POA: Diagnosis not present

## 2017-04-24 ENCOUNTER — Ambulatory Visit (INDEPENDENT_AMBULATORY_CARE_PROVIDER_SITE_OTHER): Payer: Medicare Other | Admitting: Internal Medicine

## 2017-04-24 ENCOUNTER — Encounter: Payer: Self-pay | Admitting: Internal Medicine

## 2017-04-24 VITALS — BP 98/50 | HR 64 | Ht 61.0 in | Wt 127.0 lb

## 2017-04-24 DIAGNOSIS — R197 Diarrhea, unspecified: Secondary | ICD-10-CM | POA: Diagnosis not present

## 2017-04-24 MED ORDER — SACCHAROMYCES BOULARDII 250 MG PO CAPS: 250.0000 mg | ORAL_CAPSULE | Freq: Two times a day (BID) | ORAL | 0 refills | Status: DC

## 2017-04-24 NOTE — Progress Notes (Signed)
HISTORY OF PRESENT ILLNESS:  Tonya Pope is a 66 y.o. female , previous patient of Dr. Arlyce Dice, who presents results today for evaluation of diarrhea. Patient was evaluated by the GI physician assistant 11/30/2016 regarding diarrhea. She does have a prior history of Clostridium difficile. Testing for Clostridium difficile as well as enteric pathogens was negative. She was given an empiric course of vancomycin. Subsequently set up for colonoscopy with me for routine follow-up screening and to evaluate diarrhea. Problems with diarrhea had resolved at that time. Her complete colonoscopy with excellent bowel preparation was normal. Random colon biopsies were normal. This examination was performed 03/07/2017. Several days later she did receive some antibiotics for dental work. Patient tells me that she developed diarrhea approximately 2 weeks ago. 3-4 loose stools per day. Had one loose stool at night on 1 occasion. Her only new medication is calcium. She did increase her Effexor. She was having some abdominal cramping. This has improved. As well, or problems with diarrhea continued to improve. She has been using Imodium sporadically. No fevers. No bowel movement today. No other complaints otherwise feels well.  REVIEW OF SYSTEMS:  All non-GI ROS negative except for allergies, anxiety, arthritis, back pain, depression, fatigue, sleeping problems  Past Medical History:  Diagnosis Date  . Acne   . Anemia   . Anxiety   . Arthritis   . Colon polyps   . Depression   . Diarrhea    C. Diff  . Difficult intubation    patient stated "had to use a smaller tube" in previous surgery in 80's  . GERD (gastroesophageal reflux disease)   . Hyperlipidemia   . Insomnia   . Migraine   . OP (osteoporosis)   . Pneumonia     Past Surgical History:  Procedure Laterality Date  . ABDOMINAL HYSTERECTOMY    . ANTERIOR CERVICAL DECOMP/DISCECTOMY FUSION N/A 01/13/2017   Procedure: ACDF - C4-C5 - C5-C6;  Surgeon:  Donalee Citrin, MD;  Location: [facility] OR;  Service: Neurosurgery;  Laterality: N/A;  . CARPAL TUNNEL RELEASE Left 11/15/2016   Procedure: LEFT CARPAL TUNNEL RELEASE;  Surgeon: Cindee Salt, MD;  Location: [facility];  Service: Orthopedics;  Laterality: Left;  . CARPAL TUNNEL RELEASE Right 08/18/2016  . COLONOSCOPY W/ POLYPECTOMY    . DIAGNOSTIC LAPAROSCOPY     for endometriosis  . ENDOMETRIAL ABLATION    . TONSILLECTOMY    . TONSILLECTOMY AND ADENOIDECTOMY      Social History Tonya Pope  reports that she has quit smoking. She has never used smokeless tobacco. She reports that she drinks about 7.2 - 8.4 oz of alcohol per week . She reports that she does not use drugs.  family history includes Arthritis in her mother; Breast cancer in her maternal aunt; Colon cancer in her maternal grandfather and paternal grandmother; Depression in her sister; Diabetes in her mother; Glaucoma in her father; Heart disease in her mother; Hyperlipidemia in her father; Hypertension in her mother; Stroke in her mother; Thyroid disease in her sister.  Allergies  Allergen Reactions  . Epinephrine Other (See Comments)    Pass out / dizzy  . Promethazine Hcl Other (See Comments)    UNSPECIFIED CNS EFFECTS  . Codeine Phosphate Nausea Only  . Penicillins Rash       PHYSICAL EXAMINATION: Vital signs: BP (!) 98/50 (BP Location: Left Arm, Patient Position: Sitting, Cuff Size: Normal)   Pulse 64   Ht  (1.549 m) Comment: height measured without  shoes  Wt 127 lb (57.6 kg)   BMI 24.00 kg/m   Constitutional: generally well-appearing, no acute distress Psychiatric: alert and oriented x3, cooperative Eyes: extraocular movements intact, anicteric, conjunctiva pink Mouth: oral pharynx moist, no lesions Neck: supple no lymphadenopathy Cardiovascular: heart regular rate and rhythm, no murmur Lungs: clear to auscultation bilaterally Abdomen: soft, nontender, nondistended, no obvious ascites, no  peritoneal signs, normal bowel sounds, no organomegaly Rectal: Omitted Extremities: no clubbing cyanosis or lower extremity edema bilaterally Skin: no lesions on visible extremities Neuro: No focal deficits. Cranial nerves intact  ASSESSMENT:  #1. Recent problems with diarrhea. Suspect self-limited viral illness or possibly transient antibiotic related diarrhea. Either way, exert improving. No worrisome features at present #2. Normal colonoscopy with biopsies 03/07/2017 #3. History of Clostridium difficile remotely   PLAN:  #1. Recommended probiotic Florastor twice daily for 2 weeks #2. Imodium as needed #3. If the problem persists or worsens contact the office. We would check her stool for Clostridium difficile and consider other therapies. Otherwise follow-up as needed #4. Routine screening colonoscopy 10 years

## 2017-04-24 NOTE — Patient Instructions (Signed)
Take Over the counter Florastor 1 tablet by mouth twice a day for 2 weeks and call with an update

## 2017-05-09 DIAGNOSIS — M542 Cervicalgia: Secondary | ICD-10-CM | POA: Diagnosis not present

## 2017-05-12 ENCOUNTER — Ambulatory Visit
Admission: RE | Admit: 2017-05-12 | Discharge: 2017-05-12 | Disposition: A | Discharge status: Home / Self Care | Payer: Medicare Other | Source: Ambulatory Visit | Attending: Adult Health | Admitting: Adult Health

## 2017-05-12 DIAGNOSIS — Z1231 Encounter for screening mammogram for malignant neoplasm of breast: Secondary | ICD-10-CM

## 2017-05-14 ENCOUNTER — Other Ambulatory Visit: Payer: Self-pay | Admitting: Adult Health

## 2017-05-14 DIAGNOSIS — Z760 Encounter for issue of repeat prescription: Secondary | ICD-10-CM

## 2017-05-14 DIAGNOSIS — K2100 Gastro-esophageal reflux disease with esophagitis, without bleeding: Secondary | ICD-10-CM

## 2017-05-17 NOTE — Telephone Encounter (Signed)
Sent to the pharmacy by e-scribe. 

## 2017-06-22 DIAGNOSIS — M7661 Achilles tendinitis, right leg: Secondary | ICD-10-CM | POA: Diagnosis not present

## 2017-06-22 DIAGNOSIS — M1711 Unilateral primary osteoarthritis, right knee: Secondary | ICD-10-CM | POA: Diagnosis not present

## 2017-06-23 ENCOUNTER — Other Ambulatory Visit: Payer: Self-pay | Admitting: Adult Health

## 2017-06-26 ENCOUNTER — Other Ambulatory Visit: Payer: Self-pay | Admitting: Adult Health

## 2017-06-26 MED ORDER — VENLAFAXINE HCL ER 150 MG PO CP24: ORAL_CAPSULE | ORAL | 1 refills | Status: DC

## 2017-07-04 ENCOUNTER — Other Ambulatory Visit: Payer: Self-pay | Admitting: Adult Health

## 2017-07-04 NOTE — Telephone Encounter (Signed)
Denied. Sent to a different pharmacy on 12.10.18

## 2017-07-05 DIAGNOSIS — M47816 Spondylosis without myelopathy or radiculopathy, lumbar region: Secondary | ICD-10-CM | POA: Diagnosis not present

## 2017-07-05 DIAGNOSIS — R030 Elevated blood-pressure reading, without diagnosis of hypertension: Secondary | ICD-10-CM | POA: Diagnosis not present

## 2017-07-26 DIAGNOSIS — M7661 Achilles tendinitis, right leg: Secondary | ICD-10-CM | POA: Diagnosis not present

## 2017-07-31 ENCOUNTER — Ambulatory Visit (INDEPENDENT_AMBULATORY_CARE_PROVIDER_SITE_OTHER): Payer: Medicare Other | Admitting: Family Medicine

## 2017-07-31 ENCOUNTER — Encounter: Payer: Self-pay | Admitting: Family Medicine

## 2017-07-31 ENCOUNTER — Ambulatory Visit: Payer: Self-pay

## 2017-07-31 VITALS — BP 120/64 | HR 63 | Temp 98.5°F | Wt 127.0 lb

## 2017-07-31 DIAGNOSIS — R002 Palpitations: Secondary | ICD-10-CM | POA: Diagnosis not present

## 2017-07-31 LAB — TSH: TSH: 1.14 u[IU]/mL (ref 0.35–4.50)

## 2017-07-31 LAB — CBC WITH DIFFERENTIAL/PLATELET
BASOS PCT: 0.4 % (ref 0.0–3.0)
Basophils Absolute: 0 10*3/uL (ref 0.0–0.1)
EOS PCT: 4 % (ref 0.0–5.0)
Eosinophils Absolute: 0.2 10*3/uL (ref 0.0–0.7)
HCT: 38 % (ref 36.0–46.0)
Hemoglobin: 12.4 g/dL (ref 12.0–15.0)
LYMPHS ABS: 1.7 10*3/uL (ref 0.7–4.0)
Lymphocytes Relative: 30 % (ref 12.0–46.0)
MCHC: 32.6 g/dL (ref 30.0–36.0)
MCV: 99.7 fl (ref 78.0–100.0)
MONOS PCT: 7.9 % (ref 3.0–12.0)
Monocytes Absolute: 0.5 10*3/uL (ref 0.1–1.0)
NEUTROS ABS: 3.3 10*3/uL (ref 1.4–7.7)
NEUTROS PCT: 57.7 % (ref 43.0–77.0)
PLATELETS: 240 10*3/uL (ref 150.0–400.0)
RBC: 3.81 Mil/uL — ABNORMAL LOW (ref 3.87–5.11)
RDW: 14.9 % (ref 11.5–15.5)
WBC: 5.7 10*3/uL (ref 4.0–10.5)

## 2017-07-31 LAB — HEPATIC FUNCTION PANEL
ALK PHOS: 58 U/L (ref 39–117)
ALT: 20 U/L (ref 0–35)
AST: 16 U/L (ref 0–37)
Albumin: 4.4 g/dL (ref 3.5–5.2)
BILIRUBIN TOTAL: 0.8 mg/dL (ref 0.2–1.2)
Bilirubin, Direct: 0.2 mg/dL (ref 0.0–0.3)
Total Protein: 6.6 g/dL (ref 6.0–8.3)

## 2017-07-31 LAB — BASIC METABOLIC PANEL
BUN: 22 mg/dL (ref 6–23)
CO2: 29 mEq/L (ref 19–32)
Calcium: 9.3 mg/dL (ref 8.4–10.5)
Chloride: 103 mEq/L (ref 96–112)
Creatinine, Ser: 0.95 mg/dL (ref 0.40–1.20)
GFR: 62.33 mL/min (ref >60.00)
Glucose, Bld: 85 mg/dL (ref 70–99)
Potassium: 4 mEq/L (ref 3.5–5.1)
SODIUM: 139 meq/L (ref 135–145)

## 2017-07-31 NOTE — Progress Notes (Signed)
   Subjective:    Patient ID: Tonya Pope, female    DOB: 01/15/1951, 67 y.o.   MRN: [id number]  HPI Here for several days of frequent palpitations. She may feel a single beat or several in a row. No associated chest pains or SOB. These are not triggered by exertion. She had an EKG for similar sensations a few years ago and was told she had PVC's. As we speak she feels fine. She drinks no caffeine and she takes no supplements.    Review of Systems  Constitutional: Negative.   Respiratory: Negative.   Cardiovascular: Positive for palpitations. Negative for chest pain and leg swelling.  Neurological: Negative.        Objective:   Physical Exam  Constitutional: She is oriented to person, place, and time. She appears well-developed and well-nourished.  Neck: No thyromegaly present.  Cardiovascular: Normal rate, regular rhythm, normal heart sounds and intact distal pulses.  No murmur heard. Occasional ectopy. EKG shows NSR.   Pulmonary/Chest: Effort normal and breath sounds normal. No respiratory distress. She has no wheezes. She has no rales.  Lymphadenopathy:    She has no cervical adenopathy.  Neurological: She is alert and oriented to person, place, and time.          Assessment & Plan:  Palpitations, likely PVC's. Get labs today including a TSH. Set up a 48 hour Holter monitor and an ECHO soon.  Gershon CraneStephen Fry, MD

## 2017-07-31 NOTE — Telephone Encounter (Signed)
Noted  

## 2017-07-31 NOTE — Telephone Encounter (Signed)
Patient called in with c/o "palpitations." I asked when did this start and she says 24 hours ago. I asked is this constant and how long it last, she says "it's not constant and they come about every 10 minutes 4 or 5 times." She checked her pulse x 1 minute and it was 94. I asked was it skipping beats, she said "no, it was regular." She denies chest pain, nausea, vomiting, denies HR > 100 during the episodes of palpitations the past 24 hours. Patient requests to be seen in the office today, according to protocol, see PCP in 4 hours, appointment made today, care advice given, verbalized understanding.  Reason for Disposition . Age > 60 years (Exception: brief heart beat symptoms that went away and now feels well)  Answer Assessment - Initial Assessment Questions 1. DESCRIPTION: "Please describe your heart rate or heart beat that you are having" (e.g., fast/slow, regular/irregular, skipped or extra beats, "palpitations")     Palpitations 2. ONSET: "When did it start?" (Minutes, hours or days)      Yesterday-around 11a-12p 3. DURATION: "How long does it last" (e.g., seconds, minutes, hours)     Unable to determine 4. PATTERN "Does it come and go, or has it been constant since it started?"  "Does it get worse with exertion?"   "Are you feeling it now?"     Comes and goes 5. TAP: "Using your hand, can you tap out what you are feeling on a chair or table in front of you, so that I can hear?" (Note: not all patients can do this)       N/A 6. HEART RATE: "Can you tell me your heart rate?" "How many beats in 15 seconds?"  (Note: not all patients can do this)      94 beats/minute 7. RECURRENT SYMPTOM: "Have you ever had this before?" If so, ask: "When was the last time?" and "What happened that time?"      Yes-years ago, palpitations 8. CAUSE: "What do you think is causing the palpitations?"     Unknown 9. CARDIAC HISTORY: "Do you have any history of heart disease?" (e.g., heart attack, angina, bypass  surgery, angioplasty, arrhythmia)      Yes-mitral valve, arrhythmia 10. OTHER SYMPTOMS: "Do you have any other symptoms?" (e.g., dizziness, chest pain, sweating, difficulty breathing)       Denies 11. PREGNANCY: "Is there any chance you are pregnant?" "When was your last menstrual period?"      No  Protocols used: HEART RATE AND HEARTBEAT QUESTIONS-A-AH

## 2017-08-02 DIAGNOSIS — M25571 Pain in right ankle and joints of right foot: Secondary | ICD-10-CM | POA: Diagnosis not present

## 2017-08-02 DIAGNOSIS — M7661 Achilles tendinitis, right leg: Secondary | ICD-10-CM | POA: Diagnosis not present

## 2017-08-02 DIAGNOSIS — M6281 Muscle weakness (generalized): Secondary | ICD-10-CM | POA: Diagnosis not present

## 2017-08-02 DIAGNOSIS — M25671 Stiffness of right ankle, not elsewhere classified: Secondary | ICD-10-CM | POA: Diagnosis not present

## 2017-08-08 DIAGNOSIS — M25671 Stiffness of right ankle, not elsewhere classified: Secondary | ICD-10-CM | POA: Diagnosis not present

## 2017-08-08 DIAGNOSIS — M25571 Pain in right ankle and joints of right foot: Secondary | ICD-10-CM | POA: Diagnosis not present

## 2017-08-08 DIAGNOSIS — M542 Cervicalgia: Secondary | ICD-10-CM | POA: Diagnosis not present

## 2017-08-08 DIAGNOSIS — R030 Elevated blood-pressure reading, without diagnosis of hypertension: Secondary | ICD-10-CM | POA: Diagnosis not present

## 2017-08-08 DIAGNOSIS — M7661 Achilles tendinitis, right leg: Secondary | ICD-10-CM | POA: Diagnosis not present

## 2017-08-08 DIAGNOSIS — M6281 Muscle weakness (generalized): Secondary | ICD-10-CM | POA: Diagnosis not present

## 2017-08-09 ENCOUNTER — Ambulatory Visit (HOSPITAL_COMMUNITY): Payer: Medicare Other | Attending: Cardiovascular Disease

## 2017-08-09 ENCOUNTER — Ambulatory Visit (INDEPENDENT_AMBULATORY_CARE_PROVIDER_SITE_OTHER): Payer: Medicare Other

## 2017-08-09 ENCOUNTER — Other Ambulatory Visit: Payer: Self-pay

## 2017-08-09 DIAGNOSIS — D649 Anemia, unspecified: Secondary | ICD-10-CM | POA: Diagnosis not present

## 2017-08-09 DIAGNOSIS — J189 Pneumonia, unspecified organism: Secondary | ICD-10-CM | POA: Diagnosis not present

## 2017-08-09 DIAGNOSIS — R002 Palpitations: Secondary | ICD-10-CM | POA: Insufficient documentation

## 2017-08-09 DIAGNOSIS — I5189 Other ill-defined heart diseases: Secondary | ICD-10-CM | POA: Insufficient documentation

## 2017-08-09 DIAGNOSIS — E785 Hyperlipidemia, unspecified: Secondary | ICD-10-CM | POA: Diagnosis not present

## 2017-08-09 DIAGNOSIS — G43909 Migraine, unspecified, not intractable, without status migrainosus: Secondary | ICD-10-CM | POA: Diagnosis not present

## 2017-08-09 DIAGNOSIS — F419 Anxiety disorder, unspecified: Secondary | ICD-10-CM | POA: Diagnosis not present

## 2017-08-09 LAB — ECHOCARDIOGRAM COMPLETE
AOASC: 30 cm
E decel time: 169 msec
EERAT: 9.72
FS: 43 % (ref 28–44)
IV/PV OW: 0.94
LA diam end sys: 39 mm
LA vol A4C: 45 ml
LA vol index: 32.1 mL/m2
LADIAMINDEX: 2.5 cm/m2
LASIZE: 39 mm
LAVOL: 50 mL
LV TDI E'LATERAL: 8.33
LV TDI E'MEDIAL: 7.9
LV e' LATERAL: 8.33 cm/s
LVEEAVG: 9.72
LVEEMED: 9.72
LVOT VTI: 25.4 cm
LVOT area: 2.54 cm2
LVOT diameter: 18 mm
LVOT peak grad rest: 6 mmHg
LVOTPV: 119 cm/s
LVOTSV: 65 mL
MV Dec: 169
MV pk A vel: 94.9 m/s
MVPG: 3 mmHg
MVPKEVEL: 81 m/s
PW: 9.86 mm — AB (ref 0.6–1.1)
Reg peak vel: 263 cm/s
TR max vel: 263 cm/s

## 2017-08-10 DIAGNOSIS — M25671 Stiffness of right ankle, not elsewhere classified: Secondary | ICD-10-CM | POA: Diagnosis not present

## 2017-08-10 DIAGNOSIS — M25571 Pain in right ankle and joints of right foot: Secondary | ICD-10-CM | POA: Diagnosis not present

## 2017-08-10 DIAGNOSIS — M6281 Muscle weakness (generalized): Secondary | ICD-10-CM | POA: Diagnosis not present

## 2017-08-10 DIAGNOSIS — M7661 Achilles tendinitis, right leg: Secondary | ICD-10-CM | POA: Diagnosis not present

## 2017-08-17 DIAGNOSIS — M7661 Achilles tendinitis, right leg: Secondary | ICD-10-CM | POA: Diagnosis not present

## 2017-08-17 DIAGNOSIS — M25671 Stiffness of right ankle, not elsewhere classified: Secondary | ICD-10-CM | POA: Diagnosis not present

## 2017-08-17 DIAGNOSIS — M6281 Muscle weakness (generalized): Secondary | ICD-10-CM | POA: Diagnosis not present

## 2017-08-17 DIAGNOSIS — M25571 Pain in right ankle and joints of right foot: Secondary | ICD-10-CM | POA: Diagnosis not present

## 2017-08-24 DIAGNOSIS — M25571 Pain in right ankle and joints of right foot: Secondary | ICD-10-CM | POA: Diagnosis not present

## 2017-08-24 DIAGNOSIS — M7661 Achilles tendinitis, right leg: Secondary | ICD-10-CM | POA: Diagnosis not present

## 2017-08-24 DIAGNOSIS — M6281 Muscle weakness (generalized): Secondary | ICD-10-CM | POA: Diagnosis not present

## 2017-08-24 DIAGNOSIS — M25671 Stiffness of right ankle, not elsewhere classified: Secondary | ICD-10-CM | POA: Diagnosis not present

## 2017-08-28 DIAGNOSIS — M25571 Pain in right ankle and joints of right foot: Secondary | ICD-10-CM | POA: Diagnosis not present

## 2017-08-28 DIAGNOSIS — M7661 Achilles tendinitis, right leg: Secondary | ICD-10-CM | POA: Diagnosis not present

## 2017-08-28 DIAGNOSIS — M6281 Muscle weakness (generalized): Secondary | ICD-10-CM | POA: Diagnosis not present

## 2017-08-28 DIAGNOSIS — M25671 Stiffness of right ankle, not elsewhere classified: Secondary | ICD-10-CM | POA: Diagnosis not present

## 2017-08-30 DIAGNOSIS — M25671 Stiffness of right ankle, not elsewhere classified: Secondary | ICD-10-CM | POA: Diagnosis not present

## 2017-08-30 DIAGNOSIS — M25571 Pain in right ankle and joints of right foot: Secondary | ICD-10-CM | POA: Diagnosis not present

## 2017-08-30 DIAGNOSIS — M7661 Achilles tendinitis, right leg: Secondary | ICD-10-CM | POA: Diagnosis not present

## 2017-08-30 DIAGNOSIS — M6281 Muscle weakness (generalized): Secondary | ICD-10-CM | POA: Diagnosis not present

## 2017-09-04 DIAGNOSIS — M6281 Muscle weakness (generalized): Secondary | ICD-10-CM | POA: Diagnosis not present

## 2017-09-04 DIAGNOSIS — M7661 Achilles tendinitis, right leg: Secondary | ICD-10-CM | POA: Diagnosis not present

## 2017-09-04 DIAGNOSIS — M25671 Stiffness of right ankle, not elsewhere classified: Secondary | ICD-10-CM | POA: Diagnosis not present

## 2017-09-04 DIAGNOSIS — M25571 Pain in right ankle and joints of right foot: Secondary | ICD-10-CM | POA: Diagnosis not present

## 2017-09-06 DIAGNOSIS — M25671 Stiffness of right ankle, not elsewhere classified: Secondary | ICD-10-CM | POA: Diagnosis not present

## 2017-09-06 DIAGNOSIS — M25571 Pain in right ankle and joints of right foot: Secondary | ICD-10-CM | POA: Diagnosis not present

## 2017-09-06 DIAGNOSIS — M6281 Muscle weakness (generalized): Secondary | ICD-10-CM | POA: Diagnosis not present

## 2017-09-06 DIAGNOSIS — M7661 Achilles tendinitis, right leg: Secondary | ICD-10-CM | POA: Diagnosis not present

## 2017-09-11 DIAGNOSIS — M25571 Pain in right ankle and joints of right foot: Secondary | ICD-10-CM | POA: Diagnosis not present

## 2017-09-11 DIAGNOSIS — M7661 Achilles tendinitis, right leg: Secondary | ICD-10-CM | POA: Diagnosis not present

## 2017-09-11 DIAGNOSIS — M6281 Muscle weakness (generalized): Secondary | ICD-10-CM | POA: Diagnosis not present

## 2017-09-11 DIAGNOSIS — M25671 Stiffness of right ankle, not elsewhere classified: Secondary | ICD-10-CM | POA: Diagnosis not present

## 2017-09-12 ENCOUNTER — Other Ambulatory Visit: Payer: Self-pay

## 2017-09-12 ENCOUNTER — Telehealth: Payer: Self-pay | Admitting: Internal Medicine

## 2017-09-12 DIAGNOSIS — R197 Diarrhea, unspecified: Secondary | ICD-10-CM

## 2017-09-12 NOTE — Telephone Encounter (Signed)
Check stool for Clostridium difficile

## 2017-09-12 NOTE — Telephone Encounter (Signed)
Pt states she has started having diarrhea again, reports it started about 3 weeks ago. States she has been taking Imodium but it is not helping. Pt is takinging Florastor BID. Reports since 9pm last night she has had 6 watery stools. States there has not been any blood in the stool. Please advise.

## 2017-09-12 NOTE — Telephone Encounter (Signed)
Spoke with pt and she is aware, order in epic. 

## 2017-09-13 ENCOUNTER — Other Ambulatory Visit: Payer: Medicare Other

## 2017-09-14 ENCOUNTER — Other Ambulatory Visit: Payer: Medicare Other

## 2017-09-14 DIAGNOSIS — R197 Diarrhea, unspecified: Secondary | ICD-10-CM

## 2017-09-17 LAB — CLOSTRIDIUM DIFFICILE BY PCR: Toxigenic C. Difficile by PCR: NEGATIVE

## 2017-09-19 ENCOUNTER — Encounter: Payer: Self-pay | Admitting: Nurse Practitioner

## 2017-09-19 ENCOUNTER — Ambulatory Visit (INDEPENDENT_AMBULATORY_CARE_PROVIDER_SITE_OTHER): Payer: Medicare Other | Admitting: Nurse Practitioner

## 2017-09-19 ENCOUNTER — Other Ambulatory Visit (INDEPENDENT_AMBULATORY_CARE_PROVIDER_SITE_OTHER): Payer: Medicare Other

## 2017-09-19 VITALS — BP 96/60 | HR 80 | Ht 61.0 in | Wt 129.8 lb

## 2017-09-19 DIAGNOSIS — R197 Diarrhea, unspecified: Secondary | ICD-10-CM

## 2017-09-19 DIAGNOSIS — R1084 Generalized abdominal pain: Secondary | ICD-10-CM | POA: Diagnosis not present

## 2017-09-19 DIAGNOSIS — R140 Abdominal distension (gaseous): Secondary | ICD-10-CM

## 2017-09-19 DIAGNOSIS — K219 Gastro-esophageal reflux disease without esophagitis: Secondary | ICD-10-CM | POA: Diagnosis not present

## 2017-09-19 LAB — BASIC METABOLIC PANEL
BUN: 22 mg/dL (ref 6–23)
CO2: 30 mEq/L (ref 19–32)
Calcium: 10 mg/dL (ref 8.4–10.5)
Chloride: 103 mEq/L (ref 96–112)
Creatinine, Ser: 1.05 mg/dL (ref 0.40–1.20)
GFR: 55.51 mL/min — AB (ref >60.00)
Glucose, Bld: 81 mg/dL (ref 70–99)
POTASSIUM: 3.8 meq/L (ref 3.5–5.1)
SODIUM: 137 meq/L (ref 135–145)

## 2017-09-19 NOTE — Patient Instructions (Signed)
If you are age 68 or older, your body mass index should be between 23-30. Your Body mass index is 24.53 kg/m. If this is out of the aforementioned range listed, please consider follow up with your Primary Care Provider.  If you are age 68 or younger, your body mass index should be between 19-25. Your Body mass index is 24.53 kg/m. If this is out of the aformentioned range listed, please consider follow up with your Primary Care Provider.   Your physician has requested that you go to the basement for the following lab work before leaving today: BMET GI Pathogen Panel  Can take Imodium as needed after stool results are back.  Will call with results.  Thank you for choosing me and [facility].   Willette ClusterPaula Guenther, NP

## 2017-09-19 NOTE — Progress Notes (Signed)
IMPRESSION and PLAN:    #1. 67 yo female with recurrent diarrhea, this time associated with abdominal burning discomfort and excessive gas. She was evaluated in May 2018 for diarrhea. Stool studies negative as was random colon biopsies done at time of colonoscopy for polyp in June. Recurrent diarrhea in October 2018, felt to have gastroenteritis that time. Since then she relapsed in January and again 3 weeks ago.. BMs normal in between episodes and no associated weight loss. C-diff is negative. This could be IBS. Imodium helps -she is interested in a stool pathogen panel but understands it will probably be negative at this point.  -check electrolytes -offered bentyl. Tried it in past and didn't help so declined  #2.  GERD, asymptomatic on PPI      HPI:    Chief Complaint: recurrent diarrhea.    Patient is a 67 year old female known to Dr. Marina Goodell for remote history of C. Difficile.  Seen in 2018 for evaluation of recurrent diarrhea.  Stool testing negative.  She underwent colonoscopy with random biopsies which were negative.  Felt to have self-limited viral infection.  Symptoms were actually improving at time of last visit.  PRN Imodium was recommended. Stools normalized shortly thereafter. She had another episode of diarrhea lasting approx 2 weeks in January but didn't contact us. Patient comes in today with another episodes of diarrah  Approximately three weeks ago she developed recurrent diarrhea. Having 4-6 small liquid BMs. . BMs not necessarily related to eating. Took 8 imodium on Tuesday and had solid stool then but it returned to normal. No blood in stools. No fever. No recent antibiotics or medication changes. No dietary changes. No unusual social stressors occurring. No recent hospitalizations or nursing home visits. Restarted Florastor a month ago, no improvement.   Having a lot of gas, unusual for her. Also having stinging pain across upper abdomen associated with the diarrhea.  Stinging stopped when stools were formed on imodium. Took a dose of imodium (2) at 7am today following loose stool at 6:45 am . No diarrhea since and stinging is better.  She feels tired and washed out.   Chemistry panel, CBC mid January unremarkable.  C. difficile 2 28.19 was negative-.  Review of systems:       Past Medical History:  Diagnosis Date  . Acne   . Anemia   . Anxiety   . Arthritis   . Colon polyps   . Depression   . Diarrhea    C. Diff  . Difficult intubation    patient stated "had to use a smaller tube" in previous surgery in 80's  . GERD (gastroesophageal reflux disease)   . Hyperlipidemia   . Insomnia   . Migraine   . OP (osteoporosis)   . Pneumonia     Patient's surgical history, family medical history, social history, medications and allergies were all reviewed in Epic    Physical Exam:     BP 96/60   Pulse 80   Ht 5\' 1"  (1.549 m)   Wt 129 lb 12.8 oz (58.9 kg)   BMI 24.53 kg/m   GENERAL:  Well developed white female in NAD PSYCH: :Pleasant, cooperative, normal affect EENT:  conjunctiva pink, mucous membranes moist, neck supple without masses CARDIAC:  RRR, no murmur heard, no peripheral edema PULM: Normal respiratory effort, lungs CTA bilaterally, no wheezing ABDOMEN:  Nondistended, soft, nontender. No obvious masses, no hepatomegaly,  normal bowel sounds SKIN:  turgor, no lesions seen Musculoskeletal:  Normal muscle tone, normal strength NEURO: Alert and oriented x 3, no focal neurologic deficits   Willette ClusterPaula Guenther , NP 09/19/2017, 3:24 PM

## 2017-09-20 ENCOUNTER — Encounter: Payer: Self-pay | Admitting: Nurse Practitioner

## 2017-09-21 ENCOUNTER — Other Ambulatory Visit: Payer: Medicare Other

## 2017-09-22 ENCOUNTER — Other Ambulatory Visit: Payer: Self-pay | Admitting: Adult Health

## 2017-09-22 DIAGNOSIS — K2100 Gastro-esophageal reflux disease with esophagitis, without bleeding: Secondary | ICD-10-CM

## 2017-09-22 DIAGNOSIS — Z760 Encounter for issue of repeat prescription: Secondary | ICD-10-CM

## 2017-09-25 LAB — GASTROINTESTINAL PATHOGEN PANEL PCR
C. difficile Tox A/B, PCR: NOT DETECTED
CRYPTOSPORIDIUM, PCR: NOT DETECTED
Campylobacter, PCR: NOT DETECTED
E COLI (STEC) STX1/STX2, PCR: NOT DETECTED
E coli (ETEC) LT/ST PCR: NOT DETECTED
E coli 0157, PCR: NOT DETECTED
Giardia lamblia, PCR: NOT DETECTED
NOROVIRUS, PCR: NOT DETECTED
ROTAVIRUS, PCR: NOT DETECTED
Salmonella, PCR: NOT DETECTED
Shigella, PCR: NOT DETECTED

## 2017-09-25 NOTE — Progress Notes (Signed)
Assessment and plan was reviewed 

## 2017-09-26 NOTE — Telephone Encounter (Signed)
Last refill 05/17/17 for 6 months.  This will refill will go on file and should last until 01/2018.  Pt will be due for yearly at that time.

## 2017-10-10 ENCOUNTER — Encounter: Payer: Self-pay | Admitting: Internal Medicine

## 2017-10-18 ENCOUNTER — Ambulatory Visit (INDEPENDENT_AMBULATORY_CARE_PROVIDER_SITE_OTHER): Payer: Medicare Other

## 2017-10-18 VITALS — BP 104/70 | HR 83 | Ht 61.0 in | Wt 130.0 lb

## 2017-10-18 DIAGNOSIS — Z0000 Encounter for general adult medical examination without abnormal findings: Secondary | ICD-10-CM

## 2017-10-18 NOTE — Patient Instructions (Addendum)
Tonya Pope , Thank you for taking time to come for your Medicare Wellness Visit. I appreciate your ongoing commitment to your health goals. Please review the following plan we discussed and let me know if I can assist you in the future.   Recommendations for Dexa Scan Female over the age of 38 Man age 68 or older If you broke a bone past the age of 76 Women menopausal age with risk factors (thin frame; smoker; hx of fx ) Post menopausal women under the age of 74 with risk factors A man age 78 to 20 with risk factors Other: Spine xray that is showing break of bone loss Back pain with possible break  Height loss of 1/2 inch or more within one year Total loss in height of 1.5 inches from your original height  Calcium '1200mg'$  with Vit D 800u per day; more as directed by physician Strength building exercises discussed; can include walking; housework; small weights or stretch bands; silver sneakers if access to the [facility]  Please visit the osteoporosis foundation.[email or web address] for up to date recommendations  Shingrix is a vaccine for the prevention of Shingles in Adults 50 and older. Please check with your benefits regarding applicable copays or out of pocket expenses.  The Shingrix is given in 2 vaccines approx 8 weeks apart. You must receive the 2nd dose prior to 6 months from receipt of the first.    These are the goals we discussed: Goals    . Patient Stated     Wants to go to [place]         This is a list of the screening recommended for you and due dates:  Health Maintenance  Topic Date Due  . Flu Shot  02/15/2018  . Mammogram  05/13/2019  . Tetanus Vaccine  05/01/2022  . Colon Cancer Screening  03/08/2027  . DEXA scan (bone density measurement)  Completed  .  Hepatitis C: One time screening is recommended by Center for Disease Control  (CDC) for  adults born from [birth year] through 1965.   Completed  . Pneumonia vaccines  Completed   Prevention of falls: Remove rugs or any tripping hazards  in the home Use Non slip mats in bathtubs and showers Placing grab bars next to the toilet and or shower Placing handrails on both sides of the stair way Adding extra lighting in the home.   Personal safety issues reviewed:  1. Consider starting a community watch program per [facility] 2.  Changes batteries is smoke detector and/or carbon monoxide detector  3.  If you have firearms; keep them in a safe place 4.  Wear protection when in the sun; Always wear sunscreen or a hat; It is good to have your doctor check your skin annually or review any new areas of concern 5. Driving safety; Keep in the right lane; stay 3 car lengths behind the car in front of you on the highway; look 3 times prior to pulling out; carry your cell phone everywhere you go!    Learn about the Yellow Dot program:  The program allows first responders at your emergency to have access to who your physician is, as well as your medications and medical conditions.  Citizens requesting the Yellow Dot Packages should contact Master Corporal Nunzio Cobbs at the [facility] [phone] for the first week of the program and beginning the week after Easter citizens should contact their Scientist, physiological.       Fall  Prevention in the Home Falls can cause injuries. They can happen to people of all ages. There are many things you can do to make your home safe and to help prevent falls. What can I do on the outside of my home?  Regularly fix the edges of walkways and driveways and fix any cracks.  Remove anything that might make you trip as you walk through a door, such as a raised step or threshold.  Trim any bushes or trees on the path to your home.  Use bright outdoor lighting.  Clear any walking paths of anything that might make someone trip, such as rocks or tools.  Regularly check to see if handrails are loose or broken. Make sure that both sides of any steps have  handrails.  Any raised decks and porches should have guardrails on the edges.  Have any leaves, snow, or ice cleared regularly.  Use sand or salt on walking paths during winter.  Clean up any spills in your garage right away. This includes oil or grease spills. What can I do in the bathroom?  Use night lights.  Install grab bars by the toilet and in the tub and shower. Do not use towel bars as grab bars.  Use non-skid mats or decals in the tub or shower.  If you need to sit down in the shower, use a plastic, non-slip stool.  Keep the floor dry. Clean up any water that spills on the floor as soon as it happens.  Remove soap buildup in the tub or shower regularly.  Attach bath mats securely with double-sided non-slip rug tape.  Do not have throw rugs and other things on the floor that can make you trip. What can I do in the bedroom?  Use night lights.  Make sure that you have a light by your bed that is easy to reach.  Do not use any sheets or blankets that are too big for your bed. They should not hang down onto the floor.  Have a firm chair that has side arms. You can use this for support while you get dressed.  Do not have throw rugs and other things on the floor that can make you trip. What can I do in the kitchen?  Clean up any spills right away.  Avoid walking on wet floors.  Keep items that you use a lot in easy-to-reach places.  If you need to reach something above you, use a strong step stool that has a grab bar.  Keep electrical cords out of the way.  Do not use floor polish or wax that makes floors slippery. If you must use wax, use non-skid floor wax.  Do not have throw rugs and other things on the floor that can make you trip. What can I do with my stairs?  Do not leave any items on the stairs.  Make sure that there are handrails on both sides of the stairs and use them. Fix handrails that are broken or loose. Make sure that handrails are as long as  the stairways.  Check any carpeting to make sure that it is firmly attached to the stairs. Fix any carpet that is loose or worn.  Avoid having throw rugs at the top or bottom of the stairs. If you do have throw rugs, attach them to the floor with carpet tape.  Make sure that you have a light switch at the top of the stairs and the bottom of the stairs. If you do  not have them, ask someone to add them for you. What else can I do to help prevent falls?  Wear shoes that: ? Do not have high heels. ? Have rubber bottoms. ? Are comfortable and fit you well. ? Are closed at the toe. Do not wear sandals.  If you use a stepladder: ? Make sure that it is fully opened. Do not climb a closed stepladder. ? Make sure that both sides of the stepladder are locked into place. ? Ask someone to hold it for you, if possible.  Clearly mark and make sure that you can see: ? Any grab bars or handrails. ? First and last steps. ? Where the edge of each step is.  Use tools that help you move around (mobility aids) if they are needed. These include: ? Canes. ? Walkers. ? Scooters. ? Crutches.  Turn on the lights when you go into a dark area. Replace any light bulbs as soon as they burn out.  Set up your furniture so you have a clear path. Avoid moving your furniture around.  If any of your floors are uneven, fix them.  If there are any pets around you, be aware of where they are.  Review your medicines with your doctor. Some medicines can make you feel dizzy. This can increase your chance of falling. Ask your doctor what other things that you can do to help prevent falls. This information is not intended to replace advice given to you by your health care provider. Make sure you discuss any questions you have with your health care provider. Document Released: 04/30/2009 Document Revised: 12/10/2015 Document Reviewed: 08/08/2014 Elsevier Interactive Patient Education  2018 [place]  Maintenance, Female Adopting a healthy lifestyle and getting preventive care can go a long way to promote health and wellness. Talk with your health care provider about what schedule of regular examinations is right for you. This is a good chance for you to check in with your provider about disease prevention and staying healthy. In between checkups, there are plenty of things you can do on your own. Experts have done a lot of research about which lifestyle changes and preventive measures are most likely to keep you healthy. Ask your health care provider for more information. Weight and diet Eat a healthy diet  Be sure to include plenty of vegetables, fruits, low-fat dairy products, and lean protein.  Do not eat a lot of foods high in solid fats, added sugars, or salt.  Get regular exercise. This is one of the most important things you can do for your health. ? Most adults should exercise for at least 150 minutes each week. The exercise should increase your heart rate and make you sweat (moderate-intensity exercise). ? Most adults should also do strengthening exercises at least twice a week. This is in addition to the moderate-intensity exercise.  Maintain a healthy weight  Body mass index (BMI) is a measurement that can be used to identify possible weight problems. It estimates body fat based on height and weight. Your health care provider can help determine your BMI and help you achieve or maintain a healthy weight.  For females 10 years of age and older: ? A BMI below 18.5 is considered underweight. ? A BMI of 18.5 to 24.9 is normal. ? A BMI of 25 to 29.9 is considered overweight. ? A BMI of 30 and above is considered obese.  Watch levels of cholesterol and blood lipids  You should start having  your blood tested for lipids and cholesterol at 68 years of age, then have this test every 5 years.  You may need to have your cholesterol levels checked more often if: ? Your lipid or  cholesterol levels are high. ? You are older than 68 years of age. ? You are at high risk for heart disease.  Cancer screening Lung Cancer  Lung cancer screening is recommended for adults 26-12 years old who are at high risk for lung cancer because of a history of smoking.  A yearly low-dose CT scan of the lungs is recommended for people who: ? Currently smoke. ? Have quit within the past 15 years. ? Have at least a 30-pack-year history of smoking. A pack year is smoking an average of one pack of cigarettes a day for 1 year.  Yearly screening should continue until it has been 15 years since you quit.  Yearly screening should stop if you develop a health problem that would prevent you from having lung cancer treatment.  Breast Cancer  Practice breast self-awareness. This means understanding how your breasts normally appear and feel.  It also means doing regular breast self-exams. Let your health care provider know about any changes, no matter how small.  If you are in your 20s or 30s, you should have a clinical breast exam (CBE) by a health care provider every 1-3 years as part of a regular health exam.  If you are 28 or older, have a CBE every year. Also consider having a breast X-ray (mammogram) every year.  If you have a family history of breast cancer, talk to your health care provider about genetic screening.  If you are at high risk for breast cancer, talk to your health care provider about having an MRI and a mammogram every year.  Breast cancer gene (BRCA) assessment is recommended for women who have family members with BRCA-related cancers. BRCA-related cancers include: ? Breast. ? Ovarian. ? Tubal. ? Peritoneal cancers.  Results of the assessment will determine the need for genetic counseling and BRCA1 and BRCA2 testing.  Cervical Cancer Your health care provider may recommend that you be screened regularly for cancer of the pelvic organs (ovaries, uterus, and  vagina). This screening involves a pelvic examination, including checking for microscopic changes to the surface of your cervix (Pap test). You may be encouraged to have this screening done every 3 years, beginning at age 55.  For women ages 7-65, health care providers may recommend pelvic exams and Pap testing every 3 years, or they may recommend the Pap and pelvic exam, combined with testing for human papilloma virus (HPV), every 5 years. Some types of HPV increase your risk of cervical cancer. Testing for HPV may also be done on women of any age with unclear Pap test results.  Other health care providers may not recommend any screening for nonpregnant women who are considered low risk for pelvic cancer and who do not have symptoms. Ask your health care provider if a screening pelvic exam is right for you.  If you have had past treatment for cervical cancer or a condition that could lead to cancer, you need Pap tests and screening for cancer for at least 20 years after your treatment. If Pap tests have been discontinued, your risk factors (such as having a new sexual partner) need to be reassessed to determine if screening should resume. Some women have medical problems that increase the chance of getting cervical cancer. In these cases, your health care  provider may recommend more frequent screening and Pap tests.  Colorectal Cancer  This type of cancer can be detected and often prevented.  Routine colorectal cancer screening usually begins at 68 years of age and continues through 68 years of age.  Your health care provider may recommend screening at an earlier age if you have risk factors for colon cancer.  Your health care provider may also recommend using home test kits to check for hidden blood in the stool.  A small camera at the end of a tube can be used to examine your colon directly (sigmoidoscopy or colonoscopy). This is done to check for the earliest forms of colorectal  cancer.  Routine screening usually begins at age 58.  Direct examination of the colon should be repeated every 5-10 years through 68 years of age. However, you may need to be screened more often if early forms of precancerous polyps or small growths are found.  Skin Cancer  Check your skin from head to toe regularly.  Tell your health care provider about any new moles or changes in moles, especially if there is a change in a mole's shape or color.  Also tell your health care provider if you have a mole that is larger than the size of a pencil eraser.  Always use sunscreen. Apply sunscreen liberally and repeatedly throughout the day.  Protect yourself by wearing long sleeves, pants, a wide-brimmed hat, and sunglasses whenever you are outside.  Heart disease, diabetes, and high blood pressure  High blood pressure causes heart disease and increases the risk of stroke. High blood pressure is more likely to develop in: ? People who have blood pressure in the high end of the normal range (130-139/85-89 mm Hg). ? People who are overweight or obese. ? People who are African American.  If you are 57-42 years of age, have your blood pressure checked every 3-5 years. If you are 61 years of age or older, have your blood pressure checked every year. You should have your blood pressure measured twice-once when you are at a hospital or clinic, and once when you are not at a hospital or clinic. Record the average of the two measurements. To check your blood pressure when you are not at a hospital or clinic, you can use: ? An automated blood pressure machine at a pharmacy. ? A home blood pressure monitor.  If you are between 64 years and 60 years old, ask your health care provider if you should take aspirin to prevent strokes.  Have regular diabetes screenings. This involves taking a blood sample to check your fasting blood sugar level. ? If you are at a normal weight and have a low risk for diabetes,  have this test once every three years after 68 years of age. ? If you are overweight and have a high risk for diabetes, consider being tested at a younger age or more often. Preventing infection Hepatitis B  If you have a higher risk for hepatitis B, you should be screened for this virus. You are considered at high risk for hepatitis B if: ? You were born in a country where hepatitis B is common. Ask your health care provider which countries are considered high risk. ? Your parents were born in a high-risk country, and you have not been immunized against hepatitis B (hepatitis B vaccine). ? You have HIV or AIDS. ? You use needles to inject street drugs. ? You live with someone who has hepatitis B. ? You  have had sex with someone who has hepatitis B. ? You get hemodialysis treatment. ? You take certain medicines for conditions, including cancer, organ transplantation, and autoimmune conditions.  Hepatitis C  Blood testing is recommended for: ? Everyone born from [birth year] through 1966. ? Anyone with known risk factors for hepatitis C.  Sexually transmitted infections (STIs)  You should be screened for sexually transmitted infections (STIs) including gonorrhea and chlamydia if: ? You are sexually active and are younger than 68 years of age. ? You are older than 68 years of age and your health care provider tells you that you are at risk for this type of infection. ? Your sexual activity has changed since you were last screened and you are at an increased risk for chlamydia or gonorrhea. Ask your health care provider if you are at risk.  If you do not have HIV, but are at risk, it may be recommended that you take a prescription medicine daily to prevent HIV infection. This is called pre-exposure prophylaxis (PrEP). You are considered at risk if: ? You are sexually active and do not regularly use condoms or know the HIV status of your partner(s). ? You take drugs by injection. ? You are  sexually active with a partner who has HIV.  Talk with your health care provider about whether you are at high risk of being infected with HIV. If you choose to begin PrEP, you should first be tested for HIV. You should then be tested every 3 months for as long as you are taking PrEP. Pregnancy  If you are premenopausal and you may become pregnant, ask your health care provider about preconception counseling.  If you may become pregnant, take 400 to 800 micrograms (mcg) of folic acid every day.  If you want to prevent pregnancy, talk to your health care provider about birth control (contraception). Osteoporosis and menopause  Osteoporosis is a disease in which the bones lose minerals and strength with aging. This can result in serious bone fractures. Your risk for osteoporosis can be identified using a bone density scan.  If you are 31 years of age or older, or if you are at risk for osteoporosis and fractures, ask your health care provider if you should be screened.  Ask your health care provider whether you should take a calcium or vitamin D supplement to lower your risk for osteoporosis.  Menopause may have certain physical symptoms and risks.  Hormone replacement therapy may reduce some of these symptoms and risks. Talk to your health care provider about whether hormone replacement therapy is right for you. Follow these instructions at home:  Schedule regular health, dental, and eye exams.  Stay current with your immunizations.  Do not use any tobacco products including cigarettes, chewing tobacco, or electronic cigarettes.  If you are pregnant, do not drink alcohol.  If you are breastfeeding, limit how much and how often you drink alcohol.  Limit alcohol intake to no more than 1 drink per day for nonpregnant women. One drink equals 12 ounces of beer, 5 ounces of wine, or 1 ounces of hard liquor.  Do not use street drugs.  Do not share needles.  Ask your health care  provider for help if you need support or information about quitting drugs.  Tell your health care provider if you often feel depressed.  Tell your health care provider if you have ever been abused or do not feel safe at home. This information is not intended to replace advice  given to you by your health care provider. Make sure you discuss any questions you have with your health care provider. Document Released: 01/17/2011 Document Revised: 12/10/2015 Document Reviewed: 04/07/2015 Elsevier Interactive Patient Education  [employer].

## 2017-10-18 NOTE — Progress Notes (Addendum)
Subjective:   Tonya Pope is a 67 y.o. female who presents for Medicare Annual (Initial)  preventive examination.  Reports health OV 07/31/17 set up for 48 hour halter monitor and echo Just found out she has IBS;  67 yo she had Cdiff; -  Diarrhea; Bentyl in am and pm - dr. 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   Colonoscopy 02/2017 Mammogram 04/2017- Osteoporosis dexa 11/2015; -2.3 - Kandee KeenCory is following   Educated regarding shingrix   Cardiac Risk Factors include: advanced age (>1455men, 58>65 women)       Objective:     Vitals: BP 104/70   Pulse 83   Ht 5\' 1"  (1.549 m)   Wt 130 lb (59 kg)   SpO2 95%   BMI 24.56 kg/m   Body mass index is 24.56 kg/m.  Advanced Directives 10/18/2017 03/07/2017 01/13/2017 01/04/2017 11/10/2016  Does Patient Have a Medical Advance Directive? Yes Yes Yes Yes Yes  Type of Advance Directive - Healthcare Power of [place];Living will Healthcare Power of [place];Living will Healthcare Power of [place];Living will Healthcare Power of [place];Living will  Does patient want to make changes to medical advance directive? - - No - Patient declined No - Patient declined -  Copy of Healthcare Power of Attorney in Chart? - - No - copy requested No - copy requested -    Tobacco Social History   Tobacco Use  Smoking Status Former Smoker  Smokeless Tobacco Never Used     Counseling given: Yes   Clinical Intake:    Past Medical History:  Diagnosis Date  . Acne   . Anemia     . Anxiety   . Arthritis   . Colon polyps   . Depression   . Diarrhea    C. Diff  . Difficult intubation    patient stated "had to use a smaller tube" in previous surgery in 80's  . GERD (gastroesophageal reflux disease)   . Hyperlipidemia   . Insomnia   . Migraine   . OP (osteoporosis)   . Pneumonia    Past Surgical History:  Procedure Laterality Date  . ABDOMINAL HYSTERECTOMY    . ANTERIOR CERVICAL DECOMP/DISCECTOMY FUSION N/A 01/13/2017   Procedure: ACDF - C4-C5 - C5-C6;  Surgeon: Donalee Citrinram, Gary, MD;  [facility];  Service: Neurosurgery;  Laterality: N/A;  . CARPAL TUNNEL RELEASE Left 11/15/2016   Procedure: LEFT CARPAL TUNNEL RELEASE;  Surgeon: Cindee SaltKuzma, Gary, MD;  Location: [facility];  Service: Orthopedics;  Laterality: Left;  . CARPAL TUNNEL RELEASE Right 08/18/2016  . COLONOSCOPY W/ POLYPECTOMY    . DIAGNOSTIC LAPAROSCOPY     for endometriosis  . ENDOMETRIAL ABLATION    . TONSILLECTOMY    . TONSILLECTOMY AND ADENOIDECTOMY     Family History  Problem Relation Age of Onset  . Stroke Mother   . Arthritis Mother   .  Diabetes Mother   . Heart disease Mother   . Hypertension Mother   . Hyperlipidemia Father   . Glaucoma Father   . Thyroid disease Sister   . Breast cancer Maternal Aunt   . Colon cancer Maternal Grandfather   . Depression Sister   . Colon cancer Paternal Grandmother    Social History   Socioeconomic History  . Marital status: Married    Spouse name: Not on file  . Number of children: Not on file  . Years of education: Not on file  . Highest education level: Not on file  Occupational History  . Not on file  Social Needs  . Financial resource strain: Not on file  . Food insecurity:    Worry: Not on file    Inability: Not on file  . Transportation needs:    Medical: Not on file    Non-medical: Not on file  Tobacco Use  . Smoking status: Former Games developer  . Smokeless tobacco: Never Used  Substance and Sexual Activity  . Alcohol  use: Yes    Alcohol/week: 7.2 - 8.4 oz    Types: 12 - 14 Glasses of wine per week    Comment: 2 glasses of wine   . Drug use: No  . Sexual activity: Not on file  Lifestyle  . Physical activity:    Days per week: Not on file    Minutes per session: Not on file  . Stress: Not on file  Relationships  . Social connections:    Talks on phone: Not on file    Gets together: Not on file    Attends religious service: Not on file    Active member of club or organization: Not on file    Attends meetings of clubs or organizations: Not on file    Relationship status: Not on file  Other Topics Concern  . 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    Widowed    No kids   Likes to garden and read.       Outpatient Encounter Medications as of 10/18/2017  Medication Sig  . Calcium Carb-Cholecalciferol (CALCIUM/VITAMIN D) 600-400 MG-UNIT TABS Take 1 tablet by mouth 2 (two) times daily.  . cromolyn (NASALCROM) 5.2 MG/ACT nasal spray Place 1 spray into both nostrils daily.  Marland Kitchen dicyclomine (BENTYL) 20 MG tablet Take 20 mg by mouth 2 (two) times daily.  Marland Kitchen etodolac (LODINE) 500 MG tablet Take 500 mg by mouth 2 (two) times daily.  Marland Kitchen loperamide (IMODIUM A-D) 2 MG tablet Take 4 mg by mouth as needed for diarrhea or loose stools.  Marland Kitchen loratadine (CLARITIN) 10 MG tablet Take 10 mg by mouth daily.  . methocarbamol (ROBAXIN) 750 MG tablet Take 750 mg by mouth 2 (two) times daily as needed for muscle spasms.  . pantoprazole (PROTONIX) 40 MG tablet TAKE 1 TABLET BY MOUTH ONCE DAILY  . saccharomyces boulardii (FLORASTOR) 250 MG capsule Take 1 capsule (250 mg total) by mouth 2 (two) times daily. For 2 weeks  . tiZANidine (ZANAFLEX) 4 MG tablet Take 1 tablet (4 mg total) by mouth every 6 (six) hours as needed for muscle spasms. (Patient taking differently: Take 4 mg by mouth at bedtime. )  . traMADol (ULTRAM) 50 MG tablet Take 50 mg by mouth every 6 (six)  hours as needed.  . venlafaxine (EFFEXOR) 37.5 MG tablet Take 37.5 mg by mouth daily with breakfast.  .  venlafaxine XR (EFFEXOR-XR) 150 MG 24 hr capsule TAKE 1 CAPSULE BY MOUTH DAILY WITH BREAKFAST   Facility-Administered Encounter Medications as of 10/18/2017  Medication  . 0.9 %  sodium chloride infusion    Activities of Daily Living In your present state of health, do you have any difficulty performing the following activities: 10/18/2017 01/13/2017  Hearing? N N  Vision? N N  Difficulty concentrating or making decisions? N N  Walking or climbing stairs? N Y  Comment - -  Dressing or bathing? N N  Doing errands, shopping? N N  Preparing Food and eating ? N -  Using the Toilet? N -  In the past six months, have you accidently leaked urine? N -  Do you have problems with loss of bowel control? Y -  Comment diarrhea is slightly under control  -  Managing your Medications? N -  Managing your Finances? N -  Housekeeping or managing your Housekeeping? N -  Some recent data might be hidden    Patient Care Team: Shirline Frees, NP as PCP - General (Family Medicine)    Assessment:   This is a routine wellness examination for Tonya Pope.  Exercise Activities and Dietary recommendations Current Exercise Habits: Home exercise routine, Type of exercise: walking  Goals    . Patient Stated     Wants to go to [place]         Fall Risk Fall Risk  10/18/2017 06/16/2015  Falls in the past year? No No     Depression Screen PHQ 2/9 Scores 10/18/2017 06/16/2015  PHQ - 2 Score 0 0     Cognitive Function MMSE - Mini Mental State Exam 10/18/2017  Not completed: (No Data)     Ad8 score reviewed for issues:  Issues making decisions:  Less interest in hobbies / activities:  Repeats questions, stories (family complaining):  Trouble using ordinary gadgets (microwave, computer, phone):  Forgets the month or year:   Mismanaging finances:   Remembering appts:  Daily problems with  thinking and/or memory: Ad8 score is=0        Immunization History  Administered Date(s) Administered  . Influenza Split 05/01/2012, 03/16/2014  . Influenza Whole 04/17/2008, 05/19/2009, 04/30/2010  . Influenza, High Dose Seasonal PF 04/17/2017  . Influenza-Unspecified 05/18/2015  . Pneumococcal Conjugate-13 12/18/2015  . Pneumococcal Polysaccharide-23 12/20/2016  . Td 07/18/2002  . Tdap 05/01/2012  . Zoster 01/29/2014     Screening Tests Health Maintenance  Topic Date Due  . INFLUENZA VACCINE  02/15/2018  . MAMMOGRAM  05/13/2019  . TETANUS/TDAP  05/01/2022  . COLONOSCOPY  03/08/2027  . DEXA SCAN  Completed  . Hepatitis C Screening  Completed  . PNA vac Low Risk Adult  Completed      Plan:      PCP Notes   Health Maintenance Dexa due in June of this year if she decides to repeat. Will hold order for now; gave her the osteoporosis foundation site for more knowledge.  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 Medical and social history . Use of alcohol, tobacco or illicit drugs  . Current medications and supplements . Functional ability and status . Nutritional status . Physical activity . Advanced directives . List of other physicians . Hospitalizations, surgeries, and ER visits in previous 12 months . Vitals . Screenings to include  cognitive, depression, and falls . Referrals and appointments  In addition, I have reviewed and discussed with patient certain preventive protocols, quality metrics, and best practice recommendations. A written personalized care plan for preventive services as well as general preventive health recommendations were provided to patient.     Izaih Kataoka, RN  10/18/2017  I have reviewed and agree with this plan of care.   Shirline Frees, AGNP

## 2017-10-20 ENCOUNTER — Encounter: Payer: Self-pay | Admitting: Adult Health

## 2017-10-24 DIAGNOSIS — M47816 Spondylosis without myelopathy or radiculopathy, lumbar region: Secondary | ICD-10-CM | POA: Diagnosis not present

## 2017-11-07 DIAGNOSIS — M7661 Achilles tendinitis, right leg: Secondary | ICD-10-CM | POA: Diagnosis not present

## 2017-11-30 IMAGING — RF DG CERVICAL SPINE 1V
1 series · 1 of 1 positions shown · non-contrast
Comparison: 10/31/2016.

CLINICAL DATA: Anterior cervical discectomy and fusion at the C4-5
and C5-6 levels.

EXAM:
CERVICAL SPINE 1 VIEW

[Series 1: run · 1 of 1 slices shown]
[im 1/1]
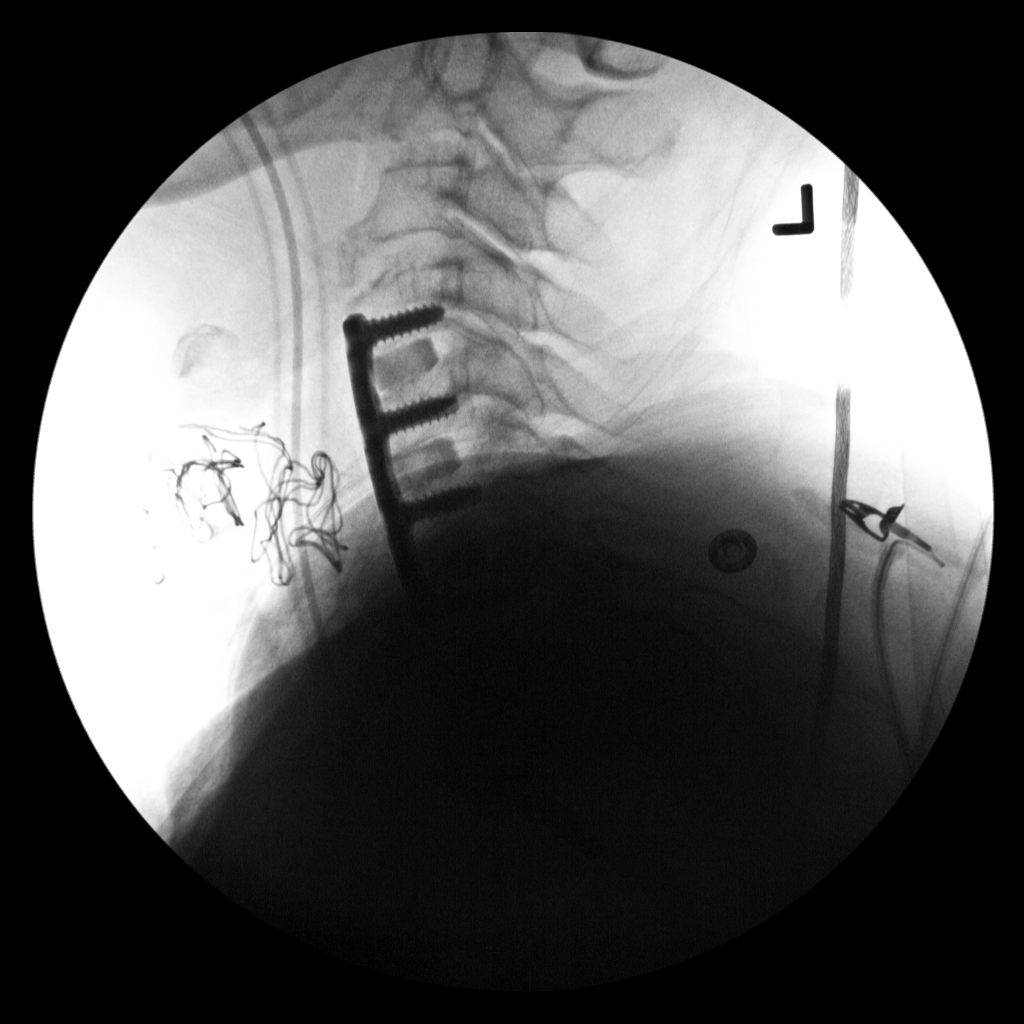

[1 of 1 positions shown; findings below may reference images not displayed]

FINDINGS: A single C-arm lateral view of the cervical spine demonstrates
interval interbody bone plug and anterior screw and plate fusion at
the C4-5, C5-6 and C6-7 levels. The inferior C5 level and lower
obscured by the patient's shoulders. Normal alignment to that level.
Again demonstrated is extensive anterior spur formation at the C3-4
level.
IMPRESSION: Interbody and hardware fusion at the C4-5 through C6-7 levels.

## 2017-12-12 DIAGNOSIS — M7661 Achilles tendinitis, right leg: Secondary | ICD-10-CM | POA: Diagnosis not present

## 2017-12-19 DIAGNOSIS — M25571 Pain in right ankle and joints of right foot: Secondary | ICD-10-CM | POA: Diagnosis not present

## 2017-12-19 DIAGNOSIS — M6281 Muscle weakness (generalized): Secondary | ICD-10-CM | POA: Diagnosis not present

## 2017-12-19 DIAGNOSIS — M7661 Achilles tendinitis, right leg: Secondary | ICD-10-CM | POA: Diagnosis not present

## 2017-12-19 DIAGNOSIS — M25671 Stiffness of right ankle, not elsewhere classified: Secondary | ICD-10-CM | POA: Diagnosis not present

## 2017-12-23 ENCOUNTER — Other Ambulatory Visit: Payer: Self-pay | Admitting: Adult Health

## 2017-12-23 DIAGNOSIS — K2100 Gastro-esophageal reflux disease with esophagitis, without bleeding: Secondary | ICD-10-CM

## 2017-12-23 DIAGNOSIS — Z760 Encounter for issue of repeat prescription: Secondary | ICD-10-CM

## 2017-12-24 ENCOUNTER — Other Ambulatory Visit: Payer: Self-pay | Admitting: Adult Health

## 2017-12-26 ENCOUNTER — Other Ambulatory Visit: Payer: Self-pay | Admitting: Family Medicine

## 2017-12-26 DIAGNOSIS — Z760 Encounter for issue of repeat prescription: Secondary | ICD-10-CM

## 2017-12-26 DIAGNOSIS — M810 Age-related osteoporosis without current pathological fracture: Secondary | ICD-10-CM

## 2017-12-26 DIAGNOSIS — N761 Subacute and chronic vaginitis: Secondary | ICD-10-CM

## 2017-12-26 MED ORDER — ESTROGENS, CONJUGATED 0.625 MG/GM VA CREA: 1.0000 | TOPICAL_CREAM | VAGINAL | 0 refills | Status: DC

## 2017-12-26 NOTE — Telephone Encounter (Signed)
Ok to refill for 90 days  

## 2017-12-26 NOTE — Telephone Encounter (Signed)
[facility]  Now due for cpx and lab work.  Ok to fill for 90 days?

## 2017-12-26 NOTE — Telephone Encounter (Signed)
Sent to the pharmacy by e-scribe as instructed. 

## 2017-12-26 NOTE — Telephone Encounter (Signed)
[facility]  She is now due for cpx and lab work.  Ok to fill for 90 days.

## 2017-12-26 NOTE — Telephone Encounter (Signed)
Sent to the pharmacy by e-scribe. 

## 2017-12-26 NOTE — Telephone Encounter (Signed)
[facility] would [facility] rx [facility].  She is trying to become a new [facility].

## 2017-12-27 DIAGNOSIS — M25571 Pain in right ankle and joints of right foot: Secondary | ICD-10-CM | POA: Diagnosis not present

## 2017-12-27 DIAGNOSIS — M6281 Muscle weakness (generalized): Secondary | ICD-10-CM | POA: Diagnosis not present

## 2017-12-27 DIAGNOSIS — M7661 Achilles tendinitis, right leg: Secondary | ICD-10-CM | POA: Diagnosis not present

## 2017-12-27 DIAGNOSIS — M25671 Stiffness of right ankle, not elsewhere classified: Secondary | ICD-10-CM | POA: Diagnosis not present

## 2018-01-01 DIAGNOSIS — M6281 Muscle weakness (generalized): Secondary | ICD-10-CM | POA: Diagnosis not present

## 2018-01-01 DIAGNOSIS — M25671 Stiffness of right ankle, not elsewhere classified: Secondary | ICD-10-CM | POA: Diagnosis not present

## 2018-01-01 DIAGNOSIS — M25571 Pain in right ankle and joints of right foot: Secondary | ICD-10-CM | POA: Diagnosis not present

## 2018-01-01 DIAGNOSIS — M7661 Achilles tendinitis, right leg: Secondary | ICD-10-CM | POA: Diagnosis not present

## 2018-01-03 DIAGNOSIS — M25671 Stiffness of right ankle, not elsewhere classified: Secondary | ICD-10-CM | POA: Diagnosis not present

## 2018-01-03 DIAGNOSIS — M6281 Muscle weakness (generalized): Secondary | ICD-10-CM | POA: Diagnosis not present

## 2018-01-03 DIAGNOSIS — M7661 Achilles tendinitis, right leg: Secondary | ICD-10-CM | POA: Diagnosis not present

## 2018-01-03 DIAGNOSIS — M25571 Pain in right ankle and joints of right foot: Secondary | ICD-10-CM | POA: Diagnosis not present

## 2018-01-09 DIAGNOSIS — M25571 Pain in right ankle and joints of right foot: Secondary | ICD-10-CM | POA: Diagnosis not present

## 2018-01-10 DIAGNOSIS — M7661 Achilles tendinitis, right leg: Secondary | ICD-10-CM | POA: Diagnosis not present

## 2018-01-10 DIAGNOSIS — M25571 Pain in right ankle and joints of right foot: Secondary | ICD-10-CM | POA: Diagnosis not present

## 2018-01-10 DIAGNOSIS — M25671 Stiffness of right ankle, not elsewhere classified: Secondary | ICD-10-CM | POA: Diagnosis not present

## 2018-01-10 DIAGNOSIS — M6281 Muscle weakness (generalized): Secondary | ICD-10-CM | POA: Diagnosis not present

## 2018-01-13 DIAGNOSIS — M25571 Pain in right ankle and joints of right foot: Secondary | ICD-10-CM | POA: Diagnosis not present

## 2018-01-17 DIAGNOSIS — M47816 Spondylosis without myelopathy or radiculopathy, lumbar region: Secondary | ICD-10-CM | POA: Diagnosis not present

## 2018-02-12 DIAGNOSIS — M7661 Achilles tendinitis, right leg: Secondary | ICD-10-CM | POA: Insufficient documentation

## 2018-02-12 DIAGNOSIS — M79671 Pain in right foot: Secondary | ICD-10-CM | POA: Diagnosis not present

## 2018-02-12 HISTORY — DX: Achilles tendinitis, right leg: M76.61

## 2018-02-16 DIAGNOSIS — M7751 Other enthesopathy of right foot: Secondary | ICD-10-CM

## 2018-02-16 HISTORY — DX: Other enthesopathy of right foot and ankle: M77.51

## 2018-02-27 DIAGNOSIS — R030 Elevated blood-pressure reading, without diagnosis of hypertension: Secondary | ICD-10-CM | POA: Diagnosis not present

## 2018-02-27 DIAGNOSIS — M5440 Lumbago with sciatica, unspecified side: Secondary | ICD-10-CM | POA: Diagnosis not present

## 2018-03-07 DIAGNOSIS — M7731 Calcaneal spur, right foot: Secondary | ICD-10-CM | POA: Diagnosis not present

## 2018-03-07 DIAGNOSIS — M7730 Calcaneal spur, unspecified foot: Secondary | ICD-10-CM | POA: Diagnosis not present

## 2018-03-07 DIAGNOSIS — G8918 Other acute postprocedural pain: Secondary | ICD-10-CM | POA: Diagnosis not present

## 2018-03-07 DIAGNOSIS — M9261 Juvenile osteochondrosis of tarsus, right ankle: Secondary | ICD-10-CM | POA: Diagnosis not present

## 2018-03-07 DIAGNOSIS — M545 Low back pain: Secondary | ICD-10-CM | POA: Diagnosis not present

## 2018-03-12 ENCOUNTER — Encounter: Payer: Self-pay | Admitting: Family Medicine

## 2018-03-12 ENCOUNTER — Ambulatory Visit (INDEPENDENT_AMBULATORY_CARE_PROVIDER_SITE_OTHER): Payer: Medicare Other

## 2018-03-12 ENCOUNTER — Other Ambulatory Visit: Payer: Self-pay | Admitting: Family Medicine

## 2018-03-12 ENCOUNTER — Ambulatory Visit (INDEPENDENT_AMBULATORY_CARE_PROVIDER_SITE_OTHER): Payer: Medicare Other | Admitting: Family Medicine

## 2018-03-12 VITALS — BP 120/60 | HR 86 | Temp 98.3°F

## 2018-03-12 DIAGNOSIS — R0781 Pleurodynia: Secondary | ICD-10-CM

## 2018-03-12 MED ORDER — HYDROCODONE-ACETAMINOPHEN 5-325 MG PO TABS: 1.0000 | ORAL_TABLET | Freq: Three times a day (TID) | ORAL | 0 refills | Status: AC | PRN

## 2018-03-12 NOTE — Progress Notes (Signed)
Tonya Pope DOB: Nov 02, 1950 Encounter date: 03/12/2018  This is a 67 y.o. female who presents with Chief Complaint  Patient presents with  . pulled muscle    pt had surgery on her foot wednesday. pt states that she has been using a knee scooter around the house and thursday night as she was turning the right rib cage "popped". pain is located across the right rib cage and down to the hip, 9/10 on pain scale, pt took robaxin and oxycodone    History of present illness:  If taking the robaxin and oxycodone she can go to sleep at night. Taking BID. Gets relief for about 4 hours after taking this.   Coughing hurts. Twisting, turning is painful. Thinks she did it weds night; just an upper body rotation.   Painful to breathe but not difficult to breathe.     Allergies  Allergen Reactions  . Epinephrine Other (See Comments)    Pass out / dizzy  . Promethazine Hcl Other (See Comments)    UNSPECIFIED CNS EFFECTS  . Codeine Phosphate Nausea Only  . Penicillins Rash   Current Meds  Medication Sig  . aspirin EC 81 MG tablet Take by mouth.  . Calcium Carb-Cholecalciferol (CALCIUM/VITAMIN D) 600-400 MG-UNIT TABS Take 1 tablet by mouth 2 (two) times daily.  Marland Kitchen conjugated estrogens (PREMARIN) vaginal cream Place 1 Applicatorful vaginally 2 (two) times a week.  . cromolyn (NASALCROM) 5.2 MG/ACT nasal spray Place 1 spray into both nostrils daily.  Marland Kitchen dicyclomine (BENTYL) 20 MG tablet Take 20 mg by mouth 2 (two) times daily.  Marland Kitchen etodolac (LODINE) 500 MG tablet Take 500 mg by mouth 2 (two) times daily.  Marland Kitchen loperamide (IMODIUM A-D) 2 MG tablet Take 4 mg by mouth as needed for diarrhea or loose stools.  Marland Kitchen loratadine (CLARITIN) 10 MG tablet Take 10 mg by mouth daily.  . methocarbamol (ROBAXIN) 750 MG tablet Take 750 mg by mouth 2 (two) times daily as needed for muscle spasms.  Marland Kitchen oxyCODONE-acetaminophen (PERCOCET/ROXICET) 5-325 MG tablet Take by mouth.  . pantoprazole (PROTONIX) 40 MG tablet TAKE  ONE TABLET BY MOUTH ONE TIME DAILY   . saccharomyces boulardii (FLORASTOR) 250 MG capsule Take 1 capsule (250 mg total) by mouth 2 (two) times daily. For 2 weeks  . tiZANidine (ZANAFLEX) 4 MG tablet Take 1 tablet (4 mg total) by mouth every 6 (six) hours as needed for muscle spasms. (Patient taking differently: Take 4 mg by mouth at bedtime. )  . traMADol (ULTRAM) 50 MG tablet Take 50 mg by mouth every 6 (six) hours as needed.  . venlafaxine (EFFEXOR) 37.5 MG tablet Take 37.5 mg by mouth daily with breakfast.  . venlafaxine XR (EFFEXOR-XR) 150 MG 24 hr capsule TAKE 1 CAPSULE BY MOUTH DAILY WITH BREAKFAST   Current Facility-Administered Medications for the 03/12/18 encounter (Office Visit) with Wynn Banker, MD  Medication  . 0.9 %  sodium chloride infusion    Review of Systems  Constitutional: Positive for activity change (post op; non weight bearing right foot x 3 weeks).  Respiratory: Negative for cough (no significant cough), chest tightness and shortness of breath.   Cardiovascular: Positive for chest pain (see hpi; right side).  Skin: Negative for color change.    Objective:  BP 120/60 (BP Location: Left Arm, Patient Position: Sitting, Cuff Size: Normal)   Pulse 86   Temp 98.3 F (36.8 C) (Oral)   SpO2 97%       BP Readings from Last  3 Encounters:  03/12/18 120/60  10/18/17 104/70  09/19/17 96/60   Wt Readings from Last 3 Encounters:  10/18/17 130 lb (59 kg)  09/19/17 129 lb 12.8 oz (58.9 kg)  07/31/17 127 lb (57.6 kg)    Physical Exam  Constitutional: She appears well-developed and well-nourished. No distress.  Cardiovascular: Normal rate and regular rhythm.  Murmur heard.  Systolic murmur is present with a grade of 2/6. Pulmonary/Chest: Effort normal and breath sounds normal. No stridor. She has no decreased breath sounds. She has no wheezes. She has no rhonchi. She has no rales. She exhibits tenderness and bony tenderness. She exhibits no deformity.  No  overlying skin bruising. There is tenderness along 7-12 rib most notable laterally but extending anteriorly and posteriorly. No obvious step off. Minimal discomfort with left sided chest pressure.    Assessment/Plan  1. Rib pain on right side Suspect a slight rib rotation, but will get xray to rule out fracture. Pain is significant, and she has only 3 more oxycodone tablets. We will see what xray looks like and then can plan better for needed medications. She is already taking etodolac BID for chronic issues. I have encouraged her to continue with the muscle relaxer (robaxin in day; zanaflex at night if needed). Ice area TID and can try heat as well in between with gentle rotation.  - DG Ribs Unilateral Right; Future   Return for pending xray.   [place] controlled substances [place]. No red flags if pain medication refill needed. Has taken tramadol and hydrocodone in past.      Theodis ShoveJunell Wesson Stith, MD

## 2018-03-12 NOTE — Progress Notes (Signed)
Will give small supply hydrocodone for severe pain due to rib fracture.  Tonya Pope - please check in with her Friday and see how she is doing? Also go ahead and order bone density study if she hasn't had one. Thanks!

## 2018-03-15 ENCOUNTER — Other Ambulatory Visit: Payer: Self-pay | Admitting: Family Medicine

## 2018-03-15 ENCOUNTER — Other Ambulatory Visit: Payer: Self-pay | Admitting: Adult Health

## 2018-03-15 DIAGNOSIS — E2839 Other primary ovarian failure: Secondary | ICD-10-CM

## 2018-03-15 DIAGNOSIS — R0781 Pleurodynia: Secondary | ICD-10-CM

## 2018-03-15 DIAGNOSIS — T148XXA Other injury of unspecified body region, initial encounter: Secondary | ICD-10-CM

## 2018-03-15 DIAGNOSIS — Z780 Asymptomatic menopausal state: Secondary | ICD-10-CM

## 2018-03-21 ENCOUNTER — Telehealth: Payer: Self-pay | Admitting: Family Medicine

## 2018-03-21 NOTE — Telephone Encounter (Signed)
Tonya notified that order has been updated.

## 2018-03-21 NOTE — Telephone Encounter (Signed)
Copied from CRM [id number]. Topic: General - Other >> Mar 21, 2018 10:10 AM Gerrianne Scale wrote: Reason for CRM: Archie Patten from [facility] [phone] calling to let provider know that they ned a new DX code for bone density test she states that they cant use post menopausal code

## 2018-03-24 ENCOUNTER — Other Ambulatory Visit: Payer: Self-pay | Admitting: Adult Health

## 2018-03-24 DIAGNOSIS — Z760 Encounter for issue of repeat prescription: Secondary | ICD-10-CM

## 2018-03-24 DIAGNOSIS — K2100 Gastro-esophageal reflux disease with esophagitis, without bleeding: Secondary | ICD-10-CM

## 2018-03-27 ENCOUNTER — Other Ambulatory Visit: Payer: Self-pay | Admitting: Adult Health

## 2018-03-27 NOTE — Telephone Encounter (Signed)
#  30 SENT TO THE PHARMACY BY E-SCRIBE.  PT DUE FOR YEARLY WITH CORY.

## 2018-03-27 NOTE — Telephone Encounter (Signed)
30 DAY SUPPLY SENT TO THE PHARMACY BY E-SCRIBE.  PT DUE FOR CPX WITH CORY.

## 2018-03-28 DIAGNOSIS — Z4789 Encounter for other orthopedic aftercare: Secondary | ICD-10-CM | POA: Diagnosis not present

## 2018-04-19 ENCOUNTER — Other Ambulatory Visit: Payer: Self-pay | Admitting: Adult Health

## 2018-04-19 DIAGNOSIS — Z760 Encounter for issue of repeat prescription: Secondary | ICD-10-CM

## 2018-04-19 DIAGNOSIS — Z1231 Encounter for screening mammogram for malignant neoplasm of breast: Secondary | ICD-10-CM

## 2018-04-19 DIAGNOSIS — K2100 Gastro-esophageal reflux disease with esophagitis, without bleeding: Secondary | ICD-10-CM

## 2018-04-23 DIAGNOSIS — M7661 Achilles tendinitis, right leg: Secondary | ICD-10-CM | POA: Diagnosis not present

## 2018-04-23 DIAGNOSIS — M7751 Other enthesopathy of right foot: Secondary | ICD-10-CM | POA: Diagnosis not present

## 2018-04-30 DIAGNOSIS — M6281 Muscle weakness (generalized): Secondary | ICD-10-CM | POA: Diagnosis not present

## 2018-04-30 DIAGNOSIS — R262 Difficulty in walking, not elsewhere classified: Secondary | ICD-10-CM | POA: Diagnosis not present

## 2018-04-30 DIAGNOSIS — M25571 Pain in right ankle and joints of right foot: Secondary | ICD-10-CM | POA: Diagnosis not present

## 2018-04-30 DIAGNOSIS — M25671 Stiffness of right ankle, not elsewhere classified: Secondary | ICD-10-CM | POA: Diagnosis not present

## 2018-05-01 DIAGNOSIS — Z23 Encounter for immunization: Secondary | ICD-10-CM | POA: Diagnosis not present

## 2018-05-03 ENCOUNTER — Other Ambulatory Visit: Payer: Self-pay | Admitting: Adult Health

## 2018-05-04 NOTE — Telephone Encounter (Signed)
Sent to the pharmacy by e-scribe. 

## 2018-05-06 ENCOUNTER — Other Ambulatory Visit: Payer: Self-pay | Admitting: Adult Health

## 2018-05-08 DIAGNOSIS — M25571 Pain in right ankle and joints of right foot: Secondary | ICD-10-CM | POA: Diagnosis not present

## 2018-05-08 DIAGNOSIS — R262 Difficulty in walking, not elsewhere classified: Secondary | ICD-10-CM | POA: Diagnosis not present

## 2018-05-08 DIAGNOSIS — M25671 Stiffness of right ankle, not elsewhere classified: Secondary | ICD-10-CM | POA: Diagnosis not present

## 2018-05-08 DIAGNOSIS — M6281 Muscle weakness (generalized): Secondary | ICD-10-CM | POA: Diagnosis not present

## 2018-05-08 NOTE — Telephone Encounter (Signed)
Appt on 05/09/18

## 2018-05-09 ENCOUNTER — Ambulatory Visit (INDEPENDENT_AMBULATORY_CARE_PROVIDER_SITE_OTHER): Payer: Medicare Other | Admitting: Adult Health

## 2018-05-09 ENCOUNTER — Other Ambulatory Visit: Payer: Self-pay | Admitting: Adult Health

## 2018-05-09 ENCOUNTER — Encounter: Payer: Self-pay | Admitting: Adult Health

## 2018-05-09 VITALS — BP 116/78 | Temp 98.1°F | Ht 61.5 in | Wt 133.0 lb

## 2018-05-09 DIAGNOSIS — M810 Age-related osteoporosis without current pathological fracture: Secondary | ICD-10-CM

## 2018-05-09 DIAGNOSIS — K2100 Gastro-esophageal reflux disease with esophagitis, without bleeding: Secondary | ICD-10-CM

## 2018-05-09 DIAGNOSIS — E782 Mixed hyperlipidemia: Secondary | ICD-10-CM | POA: Diagnosis not present

## 2018-05-09 DIAGNOSIS — F419 Anxiety disorder, unspecified: Secondary | ICD-10-CM

## 2018-05-09 DIAGNOSIS — Z760 Encounter for issue of repeat prescription: Secondary | ICD-10-CM

## 2018-05-09 DIAGNOSIS — N761 Subacute and chronic vaginitis: Secondary | ICD-10-CM | POA: Diagnosis not present

## 2018-05-09 LAB — CBC WITH DIFFERENTIAL/PLATELET
BASOS PCT: 0.9 % (ref 0.0–3.0)
Basophils Absolute: 0 10*3/uL (ref 0.0–0.1)
EOS PCT: 2 % (ref 0.0–5.0)
Eosinophils Absolute: 0.1 10*3/uL (ref 0.0–0.7)
HEMATOCRIT: 36.1 % (ref 36.0–46.0)
HEMOGLOBIN: 12.2 g/dL (ref 12.0–15.0)
Lymphocytes Relative: 33.1 % (ref 12.0–46.0)
Lymphs Abs: 1.6 10*3/uL (ref 0.7–4.0)
MCHC: 33.7 g/dL (ref 30.0–36.0)
MCV: 97.5 fl (ref 78.0–100.0)
Monocytes Absolute: 0.5 10*3/uL (ref 0.1–1.0)
Monocytes Relative: 9.4 % (ref 3.0–12.0)
NEUTROS ABS: 2.6 10*3/uL (ref 1.4–7.7)
Neutrophils Relative %: 54.6 % (ref 43.0–77.0)
PLATELETS: 273 10*3/uL (ref 150.0–400.0)
RBC: 3.7 Mil/uL — ABNORMAL LOW (ref 3.87–5.11)
RDW: 14.6 % (ref 11.5–15.5)
WBC: 4.8 10*3/uL (ref 4.0–10.5)

## 2018-05-09 LAB — LIPID PANEL
CHOLESTEROL: 201 mg/dL — AB (ref 0–200)
HDL: 78.8 mg/dL (ref >39.00)
LDL CALC: 104 mg/dL — AB (ref 0–99)
NonHDL: 122.08
TRIGLYCERIDES: 90 mg/dL (ref 0.0–149.0)
Total CHOL/HDL Ratio: 3
VLDL: 18 mg/dL (ref 0.0–40.0)

## 2018-05-09 LAB — HEPATIC FUNCTION PANEL
ALBUMIN: 4.6 g/dL (ref 3.5–5.2)
ALT: 14 U/L (ref 0–35)
AST: 14 U/L (ref 0–37)
Alkaline Phosphatase: 82 U/L (ref 39–117)
BILIRUBIN TOTAL: 0.8 mg/dL (ref 0.2–1.2)
Bilirubin, Direct: 0.2 mg/dL (ref 0.0–0.3)
Total Protein: 7 g/dL (ref 6.0–8.3)

## 2018-05-09 LAB — TSH: TSH: 0.98 u[IU]/mL (ref 0.35–4.50)

## 2018-05-09 LAB — BASIC METABOLIC PANEL
BUN: 27 mg/dL — AB (ref 6–23)
CHLORIDE: 103 meq/L (ref 96–112)
CO2: 28 meq/L (ref 19–32)
CREATININE: 1.08 mg/dL (ref 0.40–1.20)
Calcium: 9.5 mg/dL (ref 8.4–10.5)
GFR: 53.63 mL/min — ABNORMAL LOW (ref >60.00)
Glucose, Bld: 87 mg/dL (ref 70–99)
POTASSIUM: 4.6 meq/L (ref 3.5–5.1)
Sodium: 139 mEq/L (ref 135–145)

## 2018-05-09 MED ORDER — PANTOPRAZOLE SODIUM 40 MG PO TBEC: 40.0000 mg | DELAYED_RELEASE_TABLET | Freq: Two times a day (BID) | ORAL | 3 refills | Status: DC

## 2018-05-09 MED ORDER — ESTROGENS, CONJUGATED 0.625 MG/GM VA CREA: 1.0000 | TOPICAL_CREAM | VAGINAL | 1 refills | Status: DC

## 2018-05-09 MED ORDER — MELOXICAM 7.5 MG PO TABS: 7.5000 mg | ORAL_TABLET | Freq: Every day | ORAL | 0 refills | Status: DC

## 2018-05-09 NOTE — Patient Instructions (Signed)
It was great seeing you today   I will follow up with you regarding your blood work   Someone from dermatology will call you to schedule your appointment   All of your prescriptions have been sent to the pharmacy

## 2018-05-09 NOTE — Telephone Encounter (Signed)
Per Kandee Keen, ok to send in a 90 days supply.

## 2018-05-09 NOTE — Progress Notes (Signed)
Subjective:    Patient ID: Tonya Pope, female    DOB: [birth year], 67 y.o.   MRN: [id number]  HPI  Patient presents for yearly preventative medicine examination. She is a pleasant 67 year old female who  has a past medical history of Acne, Anemia, Anxiety, Arthritis, Colon polyps, Depression, Diarrhea, Difficult intubation, GERD (gastroesophageal reflux disease), Hyperlipidemia, Insomnia, Migraine, OP (osteoporosis), and Pneumonia.  Anxiety - Is controlled with Effexor.  IBS - Takes Imodium and Bentyl as needed   Osteoarthritis. - Takes Lodine currently. Does not feel as though this works well for her. She would like to try mobic.   GERD - takes Protonix 40 mg BID- feels controlled.   H/O Hyperlipidemia - has been diet controlled in the past.   All immunizations and health maintenance protocols were reviewed with the patient and needed orders were placed. She is UTD on vaccinations   Appropriate screening laboratory values were ordered for the patient including screening of hyperlipidemia, renal function and hepatic function.  Medication reconciliation,  past medical history, social history, problem list and allergies were reviewed in detail with the patient  Goals were established with regard to weight loss, exercise, and  diet in compliance with medications.   End of life planning was discussed.  She will be having surgery on right foot to remove bone spurs in the upcoming weeks.   She needs medication refills.   She is up to date on her colonoscopy. She will have a bone density screen and mammogram done at the end of the month.   Review of Systems  Constitutional: Negative.   HENT: Negative.   Eyes: Negative.   Respiratory: Negative.   Cardiovascular: Negative.   Gastrointestinal: Positive for diarrhea (intermittent).  Endocrine: Negative.   Genitourinary: Negative.   Musculoskeletal: Positive for arthralgias.  Skin: Negative.   Allergic/Immunologic: Negative.    Neurological: Negative.   Hematological: Negative.   Psychiatric/Behavioral: The patient is nervous/anxious.    Past Medical History:  Diagnosis Date  . Acne   . Anemia   . Anxiety   . Arthritis   . Colon polyps   . Depression   . Diarrhea    C. Diff  . Difficult intubation    patient stated "had to use a smaller tube" in previous surgery in 80's  . GERD (gastroesophageal reflux disease)   . Hyperlipidemia   . Insomnia   . Migraine   . OP (osteoporosis)   . Pneumonia     Social History   Socioeconomic History  . Marital status: Married    Spouse name: Not on file  . Number of children: Not on file  . Years of education: Not on file  . Highest education level: Not on file  Occupational History  . Not on file  Social Needs  . Financial resource strain: Not on file  . Food insecurity:    Worry: Not on file    Inability: Not on file  . Transportation needs:    Medical: Not on file    Non-medical: Not on file  Tobacco Use  . Smoking status: Former Games developer  . Smokeless tobacco: Never Used  Substance and Sexual Activity  . Alcohol use: Yes    Alcohol/week: 12.0 - 14.0 standard drinks    Types: 12 - 14 Glasses of wine per week    Comment: 2 glasses of wine   . Drug use: No  . Sexual activity: Not on file  Lifestyle  . Physical activity:  Days per week: Not on file    Minutes per session: Not on file  . Stress: Not on file  Relationships  . Social connections:    Talks on phone: Not on file    Gets together: Not on file    Attends religious service: Not on file    Active member of club or organization: Not on file    Attends meetings of clubs or organizations: Not on file    Relationship status: Not on file  . Intimate partner violence:    Fear of current or ex partner: Not on file    Emotionally abused: Not on file    Physically abused: Not on file    Forced sexual activity: Not on file  Other Topics Concern  . 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    Widowed    No kids   Likes to garden and read.       Past Surgical History:  Procedure Laterality Date  . ABDOMINAL HYSTERECTOMY    . ANTERIOR CERVICAL DECOMP/DISCECTOMY FUSION N/A 01/13/2017   Procedure: ACDF - C4-C5 - C5-C6;  Surgeon: Donalee Citrin, MD;  Location: [facility] OR;  Service: Neurosurgery;  Laterality: N/A;  . CARPAL TUNNEL RELEASE Left 11/15/2016   Procedure: LEFT CARPAL TUNNEL RELEASE;  Surgeon: Cindee Salt, MD;  Location: [facility];  Service: Orthopedics;  Laterality: Left;  . CARPAL TUNNEL RELEASE Right 08/18/2016  . COLONOSCOPY W/ POLYPECTOMY    . DIAGNOSTIC LAPAROSCOPY     for endometriosis  . ENDOMETRIAL ABLATION    . TONSILLECTOMY    . TONSILLECTOMY AND ADENOIDECTOMY      Family History  Problem Relation Age of Onset  . Stroke Mother   . Arthritis Mother   . Diabetes Mother   . Heart disease Mother   . Hypertension Mother   . Hyperlipidemia Father   . Glaucoma Father   . Thyroid disease Sister   . Breast cancer Maternal Aunt   . Colon cancer Maternal Grandfather   . Depression Sister   . Colon cancer Paternal Grandmother     Allergies  Allergen Reactions  . Epinephrine Other (See Comments)    Pass out / dizzy  . Promethazine Hcl Other (See Comments)    UNSPECIFIED CNS EFFECTS  . Codeine Phosphate Nausea Only  . Penicillins Rash    Current Outpatient Medications on File Prior to Visit  Medication Sig Dispense Refill  . Calcium Carb-Cholecalciferol (CALCIUM/VITAMIN D) 600-400 MG-UNIT TABS Take 1 tablet by mouth 2 (two) times daily.    Marland Kitchen dicyclomine (BENTYL) 20 MG tablet Take 20 mg by mouth 2 (two) times daily.    Marland Kitchen loperamide (IMODIUM A-D) 2 MG tablet Take 4 mg by mouth as needed for diarrhea or loose stools.    Marland Kitchen loratadine (CLARITIN) 10 MG tablet Take 10 mg by mouth daily.    . methocarbamol (ROBAXIN) 750 MG tablet Take 750 mg by mouth 2 (two) times daily as needed for  muscle spasms.    Marland Kitchen saccharomyces boulardii (FLORASTOR) 250 MG capsule Take 1 capsule (250 mg total) by mouth 2 (two) times daily. For 2 weeks 28 capsule 0  . tiZANidine (ZANAFLEX) 4 MG tablet Take 1 tablet (4 mg total) by mouth every 6 (six) hours as needed for muscle spasms. (Patient taking differently: Take 4 mg by mouth at bedtime. ) 90 tablet 3  . traMADol (ULTRAM)  50 MG tablet Take 50 mg by mouth every 6 (six) hours as needed.    . venlafaxine (EFFEXOR) 37.5 MG tablet Take 37.5 mg by mouth daily with breakfast.    . venlafaxine XR (EFFEXOR-XR) 150 MG 24 hr capsule TAKE 1 CAPSULE BY MOUTH DAILY WITH BREAKFAST 90 capsule 1   Current Facility-Administered Medications on File Prior to Visit  Medication Dose Route Frequency Provider Last Rate Last Dose  . 0.9 %  sodium chloride infusion  500 mL Intravenous Continuous Hilarie Fredrickson, MD        BP 116/78   Temp 98.1 F (36.7 C) (Oral)   Ht 5' 1.5" (1.562 m)   Wt 133 lb (60.3 kg)   BMI 24.72 kg/m       Objective:   Physical Exam  Constitutional: She is oriented to person, place, and time. She appears well-developed and well-nourished. No distress.  HENT:  Head: Normocephalic and atraumatic.  Right Ear: External ear normal.  Left Ear: External ear normal.  Nose: Nose normal.  Mouth/Throat: Oropharynx is clear and moist. No oropharyngeal exudate.  Eyes: Pupils are equal, round, and reactive to light. Conjunctivae and EOM are normal. Right eye exhibits no discharge. Left eye exhibits no discharge. No scleral icterus.  Neck: Normal range of motion. Neck supple. No JVD present. No tracheal deviation present. No thyromegaly present.  Cardiovascular: Normal rate, regular rhythm, normal heart sounds and intact distal pulses. Exam reveals no gallop and no friction rub.  No murmur heard. Pulmonary/Chest: Effort normal and breath sounds normal. No stridor. No respiratory distress. She has no wheezes. She has no rales. She exhibits no tenderness.   Abdominal: Soft. Bowel sounds are normal. She exhibits no distension and no mass. There is no tenderness. There is no rebound and no guarding. No hernia.  Genitourinary:  Genitourinary Comments: Refused breast exam   Musculoskeletal: Normal range of motion. She exhibits no edema, tenderness or deformity.  Lymphadenopathy:    She has no cervical adenopathy.  Neurological: She is alert and oriented to person, place, and time. She displays normal reflexes. No cranial nerve deficit or sensory deficit. She exhibits normal muscle tone. Coordination normal.  Skin: Skin is warm and dry. No rash noted. She is not diaphoretic. No erythema. No pallor.  Psychiatric: She has a normal mood and affect. Her behavior is normal. Judgment and thought content normal.  Nursing note and vitals reviewed.     Assessment & Plan:  1. Mixed hyperlipidemia - Consider statin  - Basic metabolic panel - CBC with Differential/Platelet - Hepatic function panel - Lipid panel - TSH  2. Gastroesophageal reflux disease with esophagitis - Controlled with Protonix.  - Basic metabolic panel - CBC with Differential/Platelet - Hepatic function panel - Lipid panel - TSH - pantoprazole (PROTONIX) 40 MG tablet; Take 1 tablet (40 mg total) by mouth 2 (two) times daily.  Dispense: 180 tablet; Refill: 3  3. Anxiety - Continue with Effexor   4. Medication refill  - pantoprazole (PROTONIX) 40 MG tablet; Take 1 tablet (40 mg total) by mouth 2 (two) times daily.  Dispense: 180 tablet; Refill: 3 - conjugated estrogens (PREMARIN) vaginal cream; Place 1 Applicatorful vaginally 2 (two) times a week.  Dispense: 90 g; Refill: 1  5. Chronic vaginitis  - conjugated estrogens (PREMARIN) vaginal cream; Place 1 Applicatorful vaginally 2 (two) times a week.  Dispense: 90 g; Refill: 1  6. Age-related osteoporosis without current pathological fracture - Encouraged weight bearing exercise as tolerated.  -  Will have Bone density screen  this month   Shirline Frees, NP

## 2018-05-14 DIAGNOSIS — R262 Difficulty in walking, not elsewhere classified: Secondary | ICD-10-CM | POA: Diagnosis not present

## 2018-05-14 DIAGNOSIS — M6281 Muscle weakness (generalized): Secondary | ICD-10-CM | POA: Diagnosis not present

## 2018-05-14 DIAGNOSIS — M25671 Stiffness of right ankle, not elsewhere classified: Secondary | ICD-10-CM | POA: Diagnosis not present

## 2018-05-14 DIAGNOSIS — M25571 Pain in right ankle and joints of right foot: Secondary | ICD-10-CM | POA: Diagnosis not present

## 2018-05-15 DIAGNOSIS — M47816 Spondylosis without myelopathy or radiculopathy, lumbar region: Secondary | ICD-10-CM | POA: Diagnosis not present

## 2018-05-16 ENCOUNTER — Ambulatory Visit
Admission: RE | Admit: 2018-05-16 | Discharge: 2018-05-16 | Disposition: A | Discharge status: Home / Self Care | Payer: Medicare Other | Source: Ambulatory Visit | Attending: Adult Health | Admitting: Adult Health

## 2018-05-16 ENCOUNTER — Ambulatory Visit
Admission: RE | Admit: 2018-05-16 | Discharge: 2018-05-16 | Disposition: A | Discharge status: Home / Self Care | Payer: Medicare Other | Source: Ambulatory Visit | Attending: Family Medicine | Admitting: Family Medicine

## 2018-05-16 DIAGNOSIS — Z1231 Encounter for screening mammogram for malignant neoplasm of breast: Secondary | ICD-10-CM | POA: Diagnosis not present

## 2018-05-16 DIAGNOSIS — Z13820 Encounter for screening for osteoporosis: Secondary | ICD-10-CM | POA: Diagnosis not present

## 2018-05-16 DIAGNOSIS — E2839 Other primary ovarian failure: Secondary | ICD-10-CM

## 2018-05-18 ENCOUNTER — Encounter: Payer: Self-pay | Admitting: Adult Health

## 2018-05-18 ENCOUNTER — Ambulatory Visit (INDEPENDENT_AMBULATORY_CARE_PROVIDER_SITE_OTHER): Payer: Medicare Other | Admitting: Adult Health

## 2018-05-18 VITALS — BP 120/66 | Temp 97.8°F | Wt 133.0 lb

## 2018-05-18 DIAGNOSIS — R202 Paresthesia of skin: Secondary | ICD-10-CM | POA: Diagnosis not present

## 2018-05-18 MED ORDER — PREDNISONE 10 MG PO TABS: 10.0000 mg | ORAL_TABLET | Freq: Every day | ORAL | 0 refills | Status: DC

## 2018-05-18 MED ORDER — VALACYCLOVIR HCL 1 G PO TABS: 1000.0000 mg | ORAL_TABLET | Freq: Three times a day (TID) | ORAL | 0 refills | Status: AC

## 2018-05-18 NOTE — Progress Notes (Signed)
Subjective:    Patient ID: Tonya Pope, female    DOB: 1951/05/18, 67 y.o.   MRN: [id number]  HPI 67 year old female who presents to the office today for an acute complaint of " tingling sensation on right side of scalp". This has been present for 2-3 days. Tingling sensation radiates across the right side of her scalp. Tingling is intermittent and only when she touches a certain area on the right forehead.   She has not noticed any rashes. She has not experienced blurred vision, facial droop, slurred speech. She has had a slight headache.    Review of Systems See HPI   Past Medical History:  Diagnosis Date  . Acne   . Anemia   . Anxiety   . Arthritis   . Colon polyps   . Depression   . Diarrhea    C. Diff  . Difficult intubation    patient stated "had to use a smaller tube" in previous surgery in 80's  . GERD (gastroesophageal reflux disease)   . Hyperlipidemia   . Insomnia   . Migraine   . OP (osteoporosis)   . Pneumonia     Social History   Socioeconomic History  . Marital status: Married    Spouse name: Not on file  . Number of children: Not on file  . Years of education: Not on file  . Highest education level: Not on file  Occupational History  . Not on file  Social Needs  . Financial resource strain: Not on file  . Food insecurity:    Worry: Not on file    Inability: Not on file  . Transportation needs:    Medical: Not on file    Non-medical: Not on file  Tobacco Use  . Smoking status: Former Games developer  . Smokeless tobacco: Never Used  Substance and Sexual Activity  . Alcohol use: Yes    Alcohol/week: 12.0 - 14.0 standard drinks    Types: 12 - 14 Glasses of wine per week    Comment: 2 glasses of wine   . Drug use: No  . Sexual activity: Not on file  Lifestyle  . Physical activity:    Days per week: Not on file    Minutes per session: Not on file  . Stress: Not on file  Relationships  . Social connections:    Talks on phone: Not on file    Gets together: Not on file    Attends religious service: Not on file    Active member of club or organization: Not on file    Attends meetings of clubs or organizations: Not on file    Relationship status: Not on file  . Intimate partner violence:    Fear of current or ex partner: Not on file    Emotionally abused: Not on file    Physically abused: Not on file    Forced sexual activity: Not on file  Other Topics Concern  . 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    Widowed    No kids   Likes to garden and read.       Past Surgical History:  Procedure Laterality Date  . ABDOMINAL HYSTERECTOMY    . ANTERIOR CERVICAL DECOMP/DISCECTOMY FUSION N/A 01/13/2017   Procedure: ACDF - C4-C5 - C5-C6;  Surgeon: Donalee Citrin, MD;  Location: [facility] OR;  Service: Neurosurgery;  Laterality: N/A;  . CARPAL  TUNNEL RELEASE Left 11/15/2016   Procedure: LEFT CARPAL TUNNEL RELEASE;  Surgeon: Cindee Salt, MD;  Location: [facility];  Service: Orthopedics;  Laterality: Left;  . CARPAL TUNNEL RELEASE Right 08/18/2016  . COLONOSCOPY W/ POLYPECTOMY    . DIAGNOSTIC LAPAROSCOPY     for endometriosis  . ENDOMETRIAL ABLATION    . TONSILLECTOMY    . TONSILLECTOMY AND ADENOIDECTOMY      Family History  Problem Relation Age of Onset  . Stroke Mother   . Arthritis Mother   . Diabetes Mother   . Heart disease Mother   . Hypertension Mother   . Hyperlipidemia Father   . Glaucoma Father   . Thyroid disease Sister   . Breast cancer Maternal Aunt   . Colon cancer Maternal Grandfather   . Depression Sister   . Colon cancer Paternal Grandmother     Allergies  Allergen Reactions  . Epinephrine Other (See Comments)    Pass out / dizzy  . Promethazine Hcl Other (See Comments)    UNSPECIFIED CNS EFFECTS  . Codeine Phosphate Nausea Only  . Penicillins Rash    Current Outpatient Medications on File Prior to Visit  Medication Sig  Dispense Refill  . Calcium Carb-Cholecalciferol (CALCIUM/VITAMIN D) 600-400 MG-UNIT TABS Take 1 tablet by mouth 2 (two) times daily.    Marland Kitchen conjugated estrogens (PREMARIN) vaginal cream Place 1 Applicatorful vaginally 2 (two) times a week. 90 g 1  . dicyclomine (BENTYL) 20 MG tablet Take 20 mg by mouth 2 (two) times daily.    Marland Kitchen loperamide (IMODIUM A-D) 2 MG tablet Take 4 mg by mouth as needed for diarrhea or loose stools.    Marland Kitchen loratadine (CLARITIN) 10 MG tablet Take 10 mg by mouth daily.    . meloxicam (MOBIC) 7.5 MG tablet TAKE 1 TABLET(7.5 MG) BY MOUTH DAILY 90 tablet 0  . methocarbamol (ROBAXIN) 750 MG tablet Take 750 mg by mouth 2 (two) times daily as needed for muscle spasms.    . pantoprazole (PROTONIX) 40 MG tablet Take 1 tablet (40 mg total) by mouth 2 (two) times daily. 180 tablet 3  . saccharomyces boulardii (FLORASTOR) 250 MG capsule Take 1 capsule (250 mg total) by mouth 2 (two) times daily. For 2 weeks 28 capsule 0  . traMADol (ULTRAM) 50 MG tablet Take 50 mg by mouth every 6 (six) hours as needed.    . venlafaxine (EFFEXOR) 37.5 MG tablet Take 37.5 mg by mouth daily with breakfast.    . venlafaxine XR (EFFEXOR-XR) 150 MG 24 hr capsule TAKE 1 CAPSULE BY MOUTH DAILY WITH BREAKFAST 90 capsule 1   Current Facility-Administered Medications on File Prior to Visit  Medication Dose Route Frequency Provider Last Rate Last Dose  . 0.9 %  sodium chloride infusion  500 mL Intravenous Continuous Hilarie Fredrickson, MD        BP 120/66   Temp 97.8 F (36.6 C) (Oral)   Wt 133 lb (60.3 kg)   BMI 24.72 kg/m       Objective:   Physical Exam  Constitutional: She is oriented to person, place, and time. She appears well-developed and well-nourished. No distress.  Eyes: Pupils are equal, round, and reactive to light. Conjunctivae and EOM are normal. Right eye exhibits no discharge. Left eye exhibits no discharge. No scleral icterus.  Cardiovascular: Normal rate, regular rhythm, normal heart sounds  and intact distal pulses.  Pulmonary/Chest: Effort normal and breath sounds normal.  Neurological: She is alert and oriented  to person, place, and time. She displays normal reflexes. No cranial nerve deficit or sensory deficit. She exhibits normal muscle tone. Coordination normal.  Skin: Skin is warm and dry. Capillary refill takes less than 2 seconds. No rash noted. She is not diaphoretic. No erythema.  No rash noted.   Psychiatric: She has a normal mood and affect. Her behavior is normal. Judgment and thought content normal.  Nursing note and vitals reviewed.     Assessment & Plan:  1. Tingling sensation in face - No concern for TIA/CVA/Temporal Arteritis. Concern for possible beginning of shingles outbreak. Will treat with Valtrex and prednisone.  - Return precautions reviewed.  - Follow up if not resolved  - predniSONE (DELTASONE) 10 MG tablet; Take 1 tablet (10 mg total) by mouth daily with breakfast.  Dispense: 5 tablet; Refill: 0 - valACYclovir (VALTREX) 1000 MG tablet; Take 1 tablet (1,000 mg total) by mouth 3 (three) times daily for 7 days.  Dispense: 21 tablet; Refill: 0  Shirline Frees, NP

## 2018-05-21 ENCOUNTER — Encounter: Payer: Self-pay | Admitting: Adult Health

## 2018-05-22 DIAGNOSIS — M6281 Muscle weakness (generalized): Secondary | ICD-10-CM | POA: Diagnosis not present

## 2018-05-22 DIAGNOSIS — R262 Difficulty in walking, not elsewhere classified: Secondary | ICD-10-CM | POA: Diagnosis not present

## 2018-05-22 DIAGNOSIS — M25571 Pain in right ankle and joints of right foot: Secondary | ICD-10-CM | POA: Diagnosis not present

## 2018-05-22 DIAGNOSIS — M25671 Stiffness of right ankle, not elsewhere classified: Secondary | ICD-10-CM | POA: Diagnosis not present

## 2018-05-25 ENCOUNTER — Ambulatory Visit: Payer: Medicare Other

## 2018-05-28 ENCOUNTER — Telehealth: Payer: Self-pay | Admitting: Adult Health

## 2018-05-28 NOTE — Telephone Encounter (Signed)
Copied from CRM [id number]. Topic: General - Other >> May 28, 2018  4:12 PM Jaquita Rector A wrote: Reason for CRM: Patient called to say that she saw Dr Kandee Keen on 05/09/18 and 05/18/18 he stated that she was to have a referral to a dermatologist. Patient states that she would prefer a practice that has a cosmetic Dermatologist Ph# [phone]

## 2018-06-04 ENCOUNTER — Encounter: Payer: Self-pay | Admitting: Adult Health

## 2018-06-05 ENCOUNTER — Other Ambulatory Visit: Payer: Self-pay | Admitting: Adult Health

## 2018-06-05 DIAGNOSIS — Z7689 Persons encountering health services in other specified circumstances: Secondary | ICD-10-CM

## 2018-06-11 ENCOUNTER — Other Ambulatory Visit: Payer: Self-pay | Admitting: Adult Health

## 2018-06-11 DIAGNOSIS — M810 Age-related osteoporosis without current pathological fracture: Secondary | ICD-10-CM

## 2018-06-12 NOTE — Telephone Encounter (Signed)
DENIED.  FILLED ON 05/09/2018 FOR 90 DAYS.  REQUEST IS EARLY.

## 2018-06-18 DIAGNOSIS — M7751 Other enthesopathy of right foot: Secondary | ICD-10-CM | POA: Diagnosis not present

## 2018-06-27 DIAGNOSIS — M461 Sacroiliitis, not elsewhere classified: Secondary | ICD-10-CM | POA: Diagnosis not present

## 2018-07-12 ENCOUNTER — Other Ambulatory Visit: Payer: Self-pay | Admitting: Adult Health

## 2018-07-12 NOTE — Telephone Encounter (Signed)
Copied from CRM [id number]. Topic: Quick Communication - Rx Refill/Question >> Jul 12, 2018  4:00 PM Lyn HollingsheadAlexander, [facility] L wrote: Medication: venlafaxine (EFFEXOR) 37.5 MG tablet  Has the patient contacted their pharmacy? Pharmacy calling to request (Agent: If no, request that the patient contact the pharmacy for the refill.) (Agent: If yes, when and what did the pharmacy advise?)  Preferred Pharmacy (with phone number or street name): [id number] - [place], [place] - [address] AT [facility] [address] & [name] [phone] (Phone) [phone] (Fax)  Agent: Please be advised that RX refills may take up to 3 business days. We ask that you follow-up with your pharmacy.

## 2018-07-12 NOTE — Telephone Encounter (Signed)
Requested medication (s) are due for refill today: not specified  Requested medication (s) are on the active medication list: yes    Last refill: 09/19/17  Future visit scheduled no  Notes to clinic: historical provider  Requested Prescriptions  Pending Prescriptions Disp Refills   venlafaxine (EFFEXOR) 37.5 MG tablet      Sig: Take 1 tablet (37.5 mg total) by mouth daily with breakfast.     Psychiatry: Antidepressants - SNRI - desvenlafaxine & venlafaxine Failed - 07/12/2018  4:04 PM      Failed - LDL in normal range and within 360 days    LDL Cholesterol  Date Value Ref Range Status  05/09/2018 104 (H) 0 - 99 mg/dL Final         Failed - Total Cholesterol in normal range and within 360 days    Cholesterol  Date Value Ref Range Status  05/09/2018 201 (H) 0 - 200 mg/dL Final    Comment:    ATP III Classification       Desirable:  < 200 mg/dL               Borderline High:  200 - 239 mg/dL          High:  > = 409240 mg/dL         Passed - Triglycerides in normal range and within 360 days    Triglycerides  Date Value Ref Range Status  05/09/2018 90.0 0.0 - 149.0 mg/dL Final    Comment:    Normal:  <150 mg/dLBorderline High:  150 - 199 mg/dL         Passed - Completed PHQ-2 or PHQ-9 in the last 360 days.      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   [facility] [place], [place]   2 months ago Mixed hyperlipidemia   [facility] [place], [place]   4 months ago Rib pain on right side   [facility], [place] LoreJunell C, MD   11 months ago Heart palpitations   [facility], Tera MaterStephen A, MD   1 year ago Mixed hyperlipidemia   [facility], [place], [place]

## 2018-07-13 MED ORDER — VENLAFAXINE HCL 37.5 MG PO TABS: 37.5000 mg | ORAL_TABLET | Freq: Every day | ORAL | 0 refills | Status: DC

## 2018-07-23 DIAGNOSIS — Z7689 Persons encountering health services in other specified circumstances: Secondary | ICD-10-CM | POA: Diagnosis not present

## 2018-07-23 DIAGNOSIS — F419 Anxiety disorder, unspecified: Secondary | ICD-10-CM | POA: Diagnosis not present

## 2018-07-23 DIAGNOSIS — F410 Panic disorder [episodic paroxysmal anxiety] without agoraphobia: Secondary | ICD-10-CM | POA: Diagnosis not present

## 2018-08-09 DIAGNOSIS — M7751 Other enthesopathy of right foot: Secondary | ICD-10-CM | POA: Diagnosis not present

## 2018-08-09 DIAGNOSIS — M25671 Stiffness of right ankle, not elsewhere classified: Secondary | ICD-10-CM | POA: Diagnosis not present

## 2018-08-09 DIAGNOSIS — M6281 Muscle weakness (generalized): Secondary | ICD-10-CM | POA: Diagnosis not present

## 2018-08-09 DIAGNOSIS — R262 Difficulty in walking, not elsewhere classified: Secondary | ICD-10-CM | POA: Diagnosis not present

## 2018-08-22 DIAGNOSIS — M6281 Muscle weakness (generalized): Secondary | ICD-10-CM | POA: Diagnosis not present

## 2018-08-22 DIAGNOSIS — R262 Difficulty in walking, not elsewhere classified: Secondary | ICD-10-CM | POA: Diagnosis not present

## 2018-08-22 DIAGNOSIS — M25571 Pain in right ankle and joints of right foot: Secondary | ICD-10-CM | POA: Diagnosis not present

## 2018-08-22 DIAGNOSIS — M25671 Stiffness of right ankle, not elsewhere classified: Secondary | ICD-10-CM | POA: Diagnosis not present

## 2018-08-30 DIAGNOSIS — M7661 Achilles tendinitis, right leg: Secondary | ICD-10-CM | POA: Diagnosis not present

## 2018-09-05 DIAGNOSIS — R262 Difficulty in walking, not elsewhere classified: Secondary | ICD-10-CM | POA: Diagnosis not present

## 2018-09-05 DIAGNOSIS — M25671 Stiffness of right ankle, not elsewhere classified: Secondary | ICD-10-CM | POA: Diagnosis not present

## 2018-09-05 DIAGNOSIS — M25571 Pain in right ankle and joints of right foot: Secondary | ICD-10-CM | POA: Diagnosis not present

## 2018-09-05 DIAGNOSIS — M6281 Muscle weakness (generalized): Secondary | ICD-10-CM | POA: Diagnosis not present

## 2018-09-07 ENCOUNTER — Other Ambulatory Visit: Payer: Self-pay | Admitting: Adult Health

## 2018-09-07 DIAGNOSIS — M810 Age-related osteoporosis without current pathological fracture: Secondary | ICD-10-CM

## 2018-09-07 NOTE — Telephone Encounter (Signed)
Sent to the pharmacy by e-scribe. 

## 2018-09-17 DIAGNOSIS — M25571 Pain in right ankle and joints of right foot: Secondary | ICD-10-CM | POA: Diagnosis not present

## 2018-09-17 DIAGNOSIS — M25671 Stiffness of right ankle, not elsewhere classified: Secondary | ICD-10-CM | POA: Diagnosis not present

## 2018-09-17 DIAGNOSIS — M6281 Muscle weakness (generalized): Secondary | ICD-10-CM | POA: Diagnosis not present

## 2018-09-17 DIAGNOSIS — R262 Difficulty in walking, not elsewhere classified: Secondary | ICD-10-CM | POA: Diagnosis not present

## 2018-09-24 DIAGNOSIS — M25571 Pain in right ankle and joints of right foot: Secondary | ICD-10-CM | POA: Diagnosis not present

## 2018-09-24 DIAGNOSIS — M25671 Stiffness of right ankle, not elsewhere classified: Secondary | ICD-10-CM | POA: Diagnosis not present

## 2018-09-24 DIAGNOSIS — R262 Difficulty in walking, not elsewhere classified: Secondary | ICD-10-CM | POA: Diagnosis not present

## 2018-09-24 DIAGNOSIS — M6281 Muscle weakness (generalized): Secondary | ICD-10-CM | POA: Diagnosis not present

## 2018-10-02 ENCOUNTER — Other Ambulatory Visit: Payer: Self-pay | Admitting: Adult Health

## 2018-10-03 NOTE — Telephone Encounter (Signed)
Sent to the pharmacy by e-scribe. 

## 2018-10-24 ENCOUNTER — Ambulatory Visit: Payer: Medicare Other

## 2018-11-01 ENCOUNTER — Other Ambulatory Visit: Payer: Self-pay | Admitting: Adult Health

## 2018-11-19 DIAGNOSIS — L738 Other specified follicular disorders: Secondary | ICD-10-CM | POA: Diagnosis not present

## 2018-11-19 DIAGNOSIS — L814 Other melanin hyperpigmentation: Secondary | ICD-10-CM | POA: Diagnosis not present

## 2018-11-19 DIAGNOSIS — L300 Nummular dermatitis: Secondary | ICD-10-CM | POA: Diagnosis not present

## 2018-11-19 DIAGNOSIS — L821 Other seborrheic keratosis: Secondary | ICD-10-CM | POA: Diagnosis not present

## 2019-01-19 ENCOUNTER — Ambulatory Visit (INDEPENDENT_AMBULATORY_CARE_PROVIDER_SITE_OTHER): Payer: Medicare Other

## 2019-01-19 ENCOUNTER — Encounter (HOSPITAL_COMMUNITY): Payer: Self-pay

## 2019-01-19 ENCOUNTER — Other Ambulatory Visit: Payer: Self-pay

## 2019-01-19 ENCOUNTER — Ambulatory Visit (HOSPITAL_COMMUNITY)
Admission: EM | Admit: 2019-01-19 | Discharge: 2019-01-19 | Disposition: A | Discharge status: Home / Self Care | Payer: Medicare Other | Attending: Family Medicine | Admitting: Family Medicine

## 2019-01-19 DIAGNOSIS — M542 Cervicalgia: Secondary | ICD-10-CM

## 2019-01-19 DIAGNOSIS — S199XXA Unspecified injury of neck, initial encounter: Secondary | ICD-10-CM | POA: Diagnosis not present

## 2019-01-19 DIAGNOSIS — S0993XA Unspecified injury of face, initial encounter: Secondary | ICD-10-CM

## 2019-01-19 DIAGNOSIS — S6992XA Unspecified injury of left wrist, hand and finger(s), initial encounter: Secondary | ICD-10-CM | POA: Diagnosis not present

## 2019-01-19 DIAGNOSIS — R040 Epistaxis: Secondary | ICD-10-CM

## 2019-01-19 DIAGNOSIS — M25532 Pain in left wrist: Secondary | ICD-10-CM

## 2019-01-19 DIAGNOSIS — S0992XA Unspecified injury of nose, initial encounter: Secondary | ICD-10-CM | POA: Diagnosis not present

## 2019-01-19 NOTE — Discharge Instructions (Signed)
Do your best to ensure adequate rest. If not allergic, take acetaminophen (Tylenol) every 4-6 hours as needed for discomfort. Sometimes individuals may develop a headache associated with mild nausea in the days or hours after a head injury. This is called a concussion and may require further follow up.  Please seek prompt medical care if: You have: A very bad (severe) headache that is not helped by medicine. Trouble walking or weakness in your arms and legs. Clear or bloody fluid coming from your nose or ears. Changes in your seeing (vision). Jerky movements that you cannot control (seizure). You throw up (vomit). Your symptoms get worse. You lose balance. Your speech is slurred. You pass out. You are sleepier and have trouble staying awake. The black centers of your eyes (pupils) change in size.  These symptoms may be an emergency. Do not wait to see if the symptoms will go away. Get medical help right away. Call your local emergency services. Do not drive yourself to the hospital.

## 2019-01-19 NOTE — ED Provider Notes (Signed)
[place]   [id number] 01/19/19 Arrival Time: 4098  ASSESSMENT & PLAN:  1. Facial injury, initial encounter   2. Epistaxis   3. Neck pain, acute   4. Left wrist pain     I have personally viewed the three imaging studies ordered this visit. No fractures observed.  OTC analgesics. Is on a pain management program; continue as needed.  Orders Placed This Encounter  Procedures   DG Cervical Spine Complete   DG Nasal Bones   DG Wrist Complete Left   See AVS for head injury precautions.  Reviewed expectations re: course of current medical issues. Questions answered. Outlined signs and symptoms indicating need for more acute intervention. Patient verbalized understanding. After Visit Summary given.  SUBJECTIVE: History from: patient. Tonya Pope is a 68 y.o. female who reports falling forward in her driveway this morning. Reports hitting face/nose on ground. No LOC. Nose bled for about 30 min and has ceased. Now noticing neck and L wrist discomfort. Normal vision. No n/v. Ambulatory without difficulty. Able to breathe through nose. No extremity sensation changes or weakness. No home treatment. Painful areas exacerbated by certain movements. No abdominal or back pain reported. No CP/SOB.  Past Surgical History:  Procedure Laterality Date   ABDOMINAL HYSTERECTOMY     ANTERIOR CERVICAL DECOMP/DISCECTOMY FUSION N/A 01/13/2017   Procedure: ACDF - C4-C5 - C5-C6;  Surgeon: Kary Kos, MD;  Location: [facility];  Service: Neurosurgery;  Laterality: N/A;   CARPAL TUNNEL RELEASE Left 11/15/2016   Procedure: LEFT CARPAL TUNNEL RELEASE;  Surgeon: Daryll Brod, MD;  Location: [facility];  Service: Orthopedics;  Laterality: Left;   CARPAL TUNNEL RELEASE Right 08/18/2016   COLONOSCOPY W/ POLYPECTOMY     DIAGNOSTIC LAPAROSCOPY     for endometriosis   ENDOMETRIAL ABLATION     TONSILLECTOMY     TONSILLECTOMY AND ADENOIDECTOMY      ROS: As per HPI. All  other systems negative.   OBJECTIVE:  Vitals:   01/19/19 1111  BP: (!) 145/82  Pulse: 81  Resp: 17  Temp: 98.1 F (36.7 C)  TempSrc: Oral  SpO2: 98%    GCS: 15 General appearance: alert; no distress HEENT: [place]; swelling and early bruising over nasal bridge; very TTP; no active epistaxis; no septal hematoma observed Neck: supple with FROM; does report TTP over mid cervical spine along with TTP over cervical musculature Resp: unlabored respirations Extremities:  LUE: warm and well perfused; poorly localized moderate tenderness over left wrist; without gross deformities; with mild swelling; with no bruising; ROM: normal with reported discomfort CV: brisk extremity capillary refill of LUE; 2+ radial pulse of LUE. Skin: warm and dry; no visible rashes Neurologic: gait normal; normal reflexes of RUE, LUE, RLE and LLE; normal sensation of RUE, LUE, RLE and LLE; normal strength of RUE, LUE, RLE and LLE Psychological: alert and cooperative; normal mood and affect  Imaging: Dg Nasal Bones  Result Date: 01/19/2019 CLINICAL DATA:  68 year old female status post fall EXAM: NASAL BONES - 3+ VIEW COMPARISON:  None. FINDINGS: There is no evidence of fracture or other bone abnormality. IMPRESSION: Negative. Electronically Signed   By: Jacqulynn Cadet M.D.   On: 01/19/2019 12:03   Dg Cervical Spine Complete  Result Date: 01/19/2019 CLINICAL DATA:  68 year old female status post fall earlier this morning EXAM: CERVICAL SPINE - COMPLETE 4+ VIEW COMPARISON:  None. FINDINGS: Surgical changes of prior anterior cervical discectomy and fusion with successful osseous fusion at C4-C5, C5-C6 and C6-C7.  Adjacent level degenerative changes present at C3-C4. No evidence of acute fracture or malalignment. The visualized upper chest is unremarkable. IMPRESSION: 1. No acute fracture or malalignment. 2. Prior anterior cervical discectomy and fusion spanning C4-C7. Electronically Signed   By: Malachy MoanHeath  McCullough M.D.    On: 01/19/2019 12:20   Dg Wrist Complete Left  Result Date: 01/19/2019 CLINICAL DATA:  Larey SeatFell this morning.  Wrist pain. EXAM: LEFT WRIST - COMPLETE 3+ VIEW COMPARISON:  None. FINDINGS: Moderate degenerative changes involving the wrist and advanced degenerative changes at the carpometacarpal joint of the thumb. No acute wrist fracture is identified. IMPRESSION: Degenerative changes but no definite acute wrist fracture. Electronically Signed   By: Rudie MeyerP.  Gallerani M.D.   On: 01/19/2019 12:32     Allergies  Allergen Reactions   Epinephrine Other (See Comments)    Pass out / dizzy   Promethazine Hcl Other (See Comments)    UNSPECIFIED CNS EFFECTS   Codeine Phosphate Nausea Only   Penicillins Rash    Past Medical History:  Diagnosis Date   Acne    Anemia    Anxiety    Arthritis    Colon polyps    Depression    Diarrhea    C. Diff   Difficult intubation    patient stated "had to use a smaller tube" in previous surgery in 80's   GERD (gastroesophageal reflux disease)    Hyperlipidemia    Insomnia    Migraine    OP (osteoporosis)    Pneumonia    Social History   Socioeconomic History   Marital status: Married    Spouse name: Not on file   Number of children: Not on file   Years of education: Not on file   Highest education level: Not on file  Occupational History   Not on file  Social Needs   Financial resource strain: Not on file   Food insecurity    Worry: Not on file    Inability: Not on file   Transportation needs    Medical: Not on file    Non-medical: Not on file  Tobacco Use   Smoking status: Former Smoker   Smokeless tobacco: Never Used  Substance and Sexual Activity   Alcohol use: Yes    Alcohol/week: 12.0 - 14.0 standard drinks    Types: 12 - 14 Glasses of wine per week    Comment: 2 glasses of wine    Drug use: No   Sexual activity: Not on file  Lifestyle   Physical activity    Days per week: Not on file    Minutes per  session: Not on file   Stress: Not on file  Relationships   Social connections    Talks on phone: Not on file    Gets together: Not on file    Attends religious service: Not on file    Active member of club or organization: Not on file    Attends meetings of clubs or organizations: Not on file    Relationship status: Not on file  Other Topics Concern   Not on file  Social History Narrative   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    Widowed    No kids   Likes to garden and read.      Family History  Problem Relation Age of Onset   Stroke Mother    Arthritis Mother    Diabetes Mother    Heart disease Mother  Hypertension Mother    Hyperlipidemia Father    Glaucoma Father    Thyroid disease Sister    Breast cancer Maternal Aunt    Colon cancer Maternal Grandfather    Depression Sister    Colon cancer Paternal Grandmother    Past Surgical History:  Procedure Laterality Date   ABDOMINAL HYSTERECTOMY     ANTERIOR CERVICAL DECOMP/DISCECTOMY FUSION N/A 01/13/2017   Procedure: ACDF - C4-C5 - C5-C6;  Surgeon: Donalee Citrinram, Gary, MD;  [facility];  Service: Neurosurgery;  Laterality: N/A;   CARPAL TUNNEL RELEASE Left 11/15/2016   Procedure: LEFT CARPAL TUNNEL RELEASE;  Surgeon: Cindee SaltKuzma, Gary, MD;  Location: [facility];  Service: Orthopedics;  Laterality: Left;   CARPAL TUNNEL RELEASE Right 08/18/2016   COLONOSCOPY W/ POLYPECTOMY     DIAGNOSTIC LAPAROSCOPY     for endometriosis   ENDOMETRIAL ABLATION     TONSILLECTOMY     TONSILLECTOMY AND ADENOIDECTOMY        Mardella LaymanHagler, Ashden Sonnenberg, MD 01/19/19 [phone]

## 2019-01-19 NOTE — ED Triage Notes (Signed)
Patient presents to Urgent Care with complaints of tripping over a concrete driveway since this morning. Patient reports no LOC, positive for back pain, neck pain, and some pain on nose. Pt states epistaxis for 30 mins, no bleeding at this time, pt denies blood thinner use.

## 2019-01-26 ENCOUNTER — Other Ambulatory Visit: Payer: Self-pay | Admitting: Adult Health

## 2019-01-27 IMAGING — DX DG RIBS 2V*R*
4 series · 4 of 4 positions shown · non-contrast
Comparison: Chest radiographs 09/24/2005.

CLINICAL DATA: 66-year-old female with right side rib pain for 5
days after "heard a pop" while using Sergio Lopes due to foot surgery.

EXAM:
RIGHT RIBS - 2 VIEW

[chest supine ap]
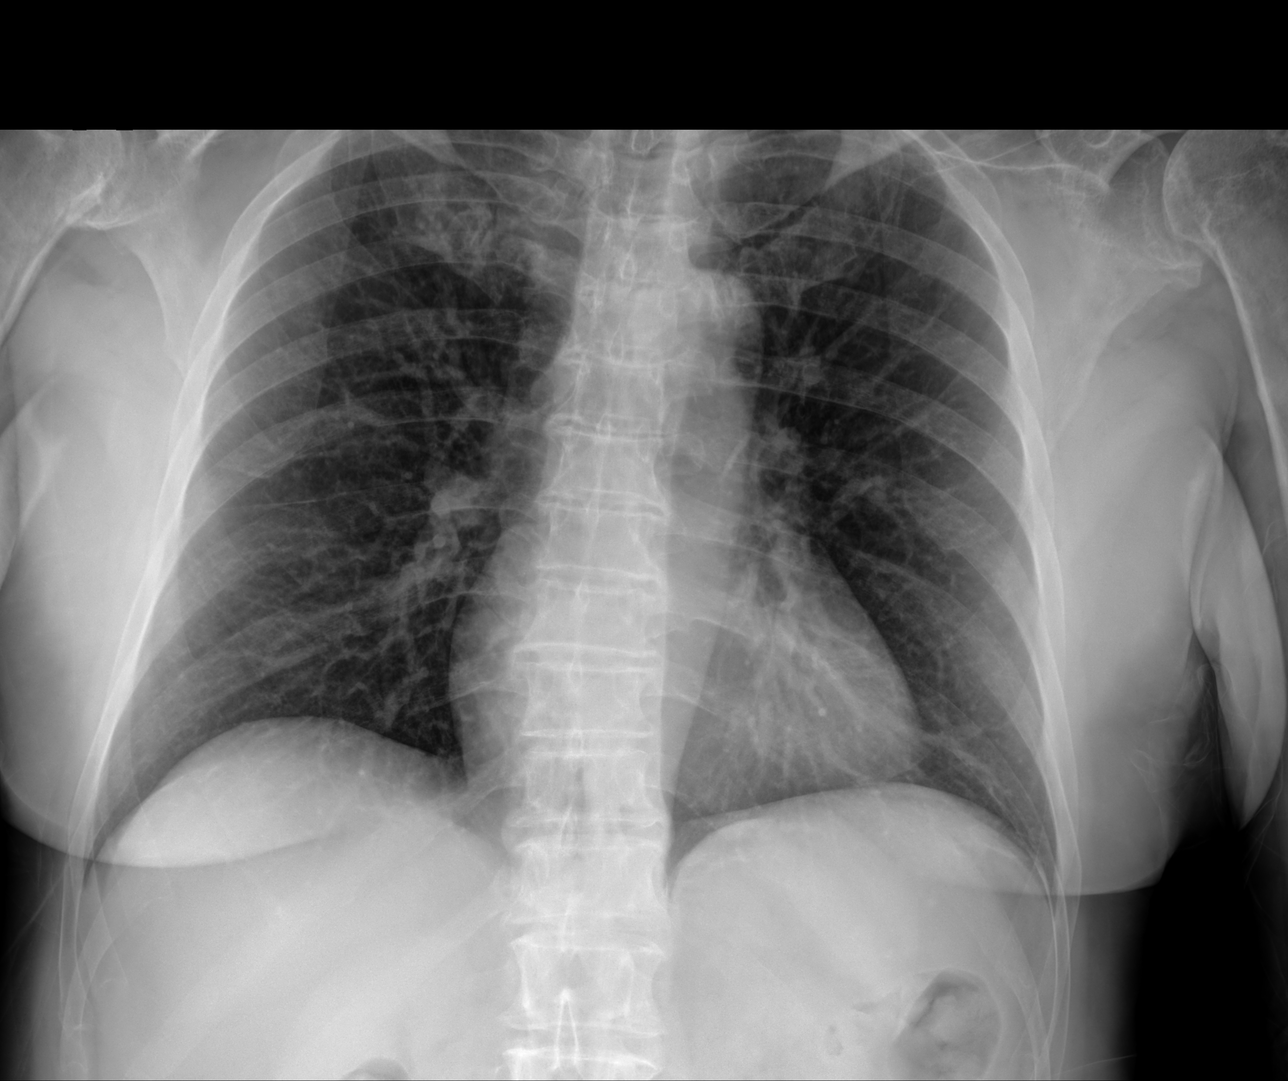

[hemithorax (ribs) ap (1 of 2)]
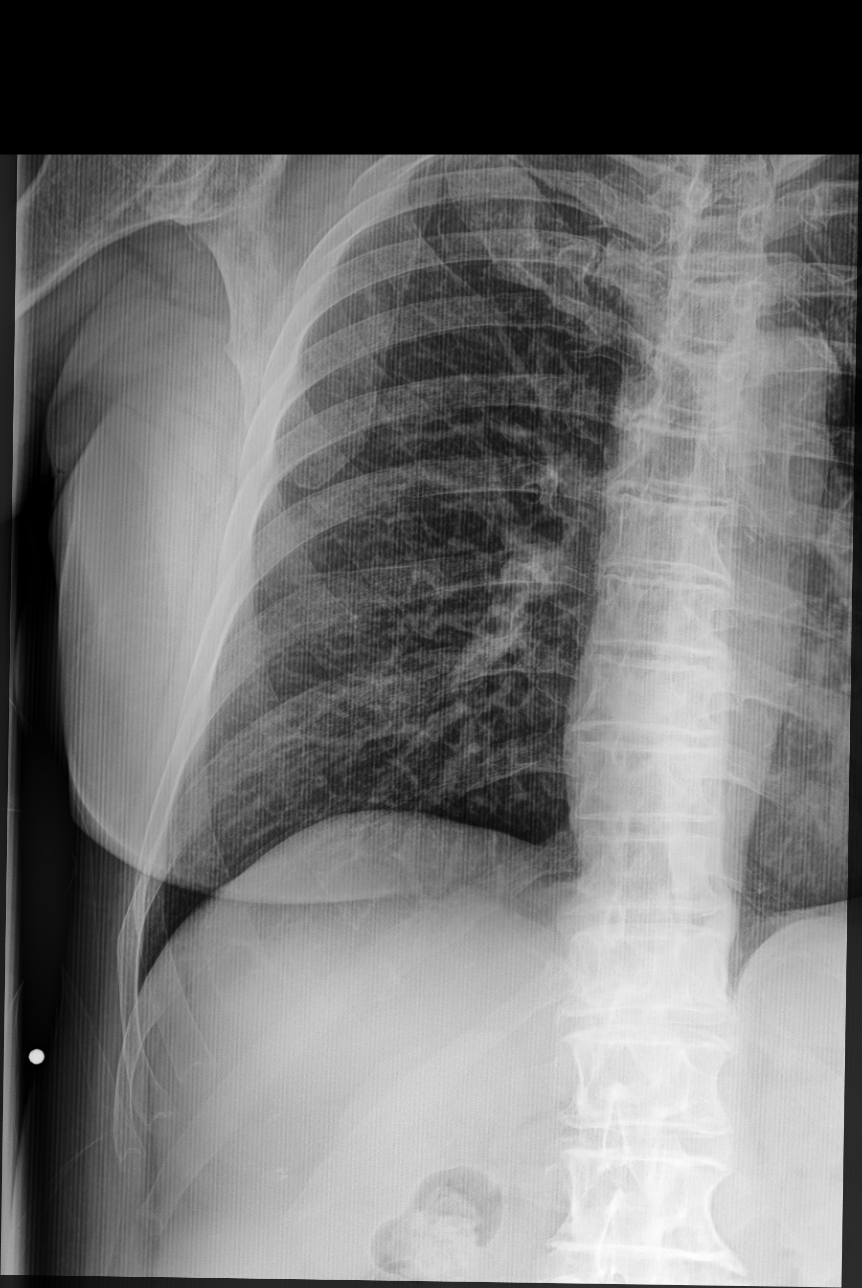

[hemithorax (ribs) ap (2 of 2)]
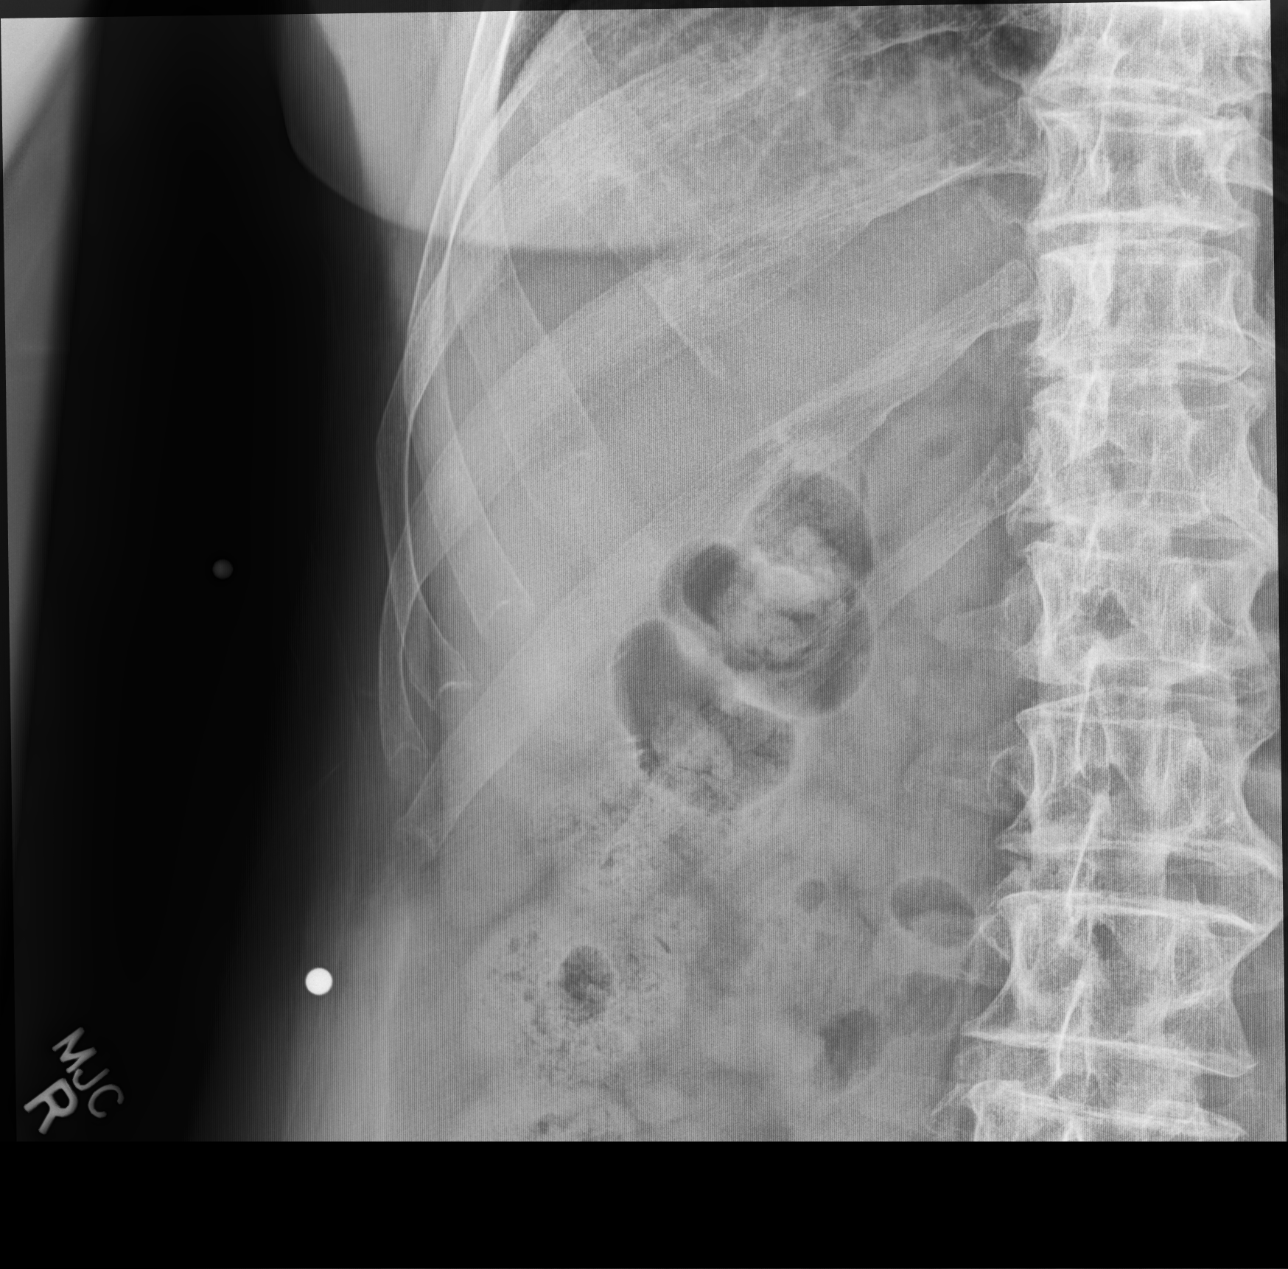

[hemithorax (ribs) mlo]
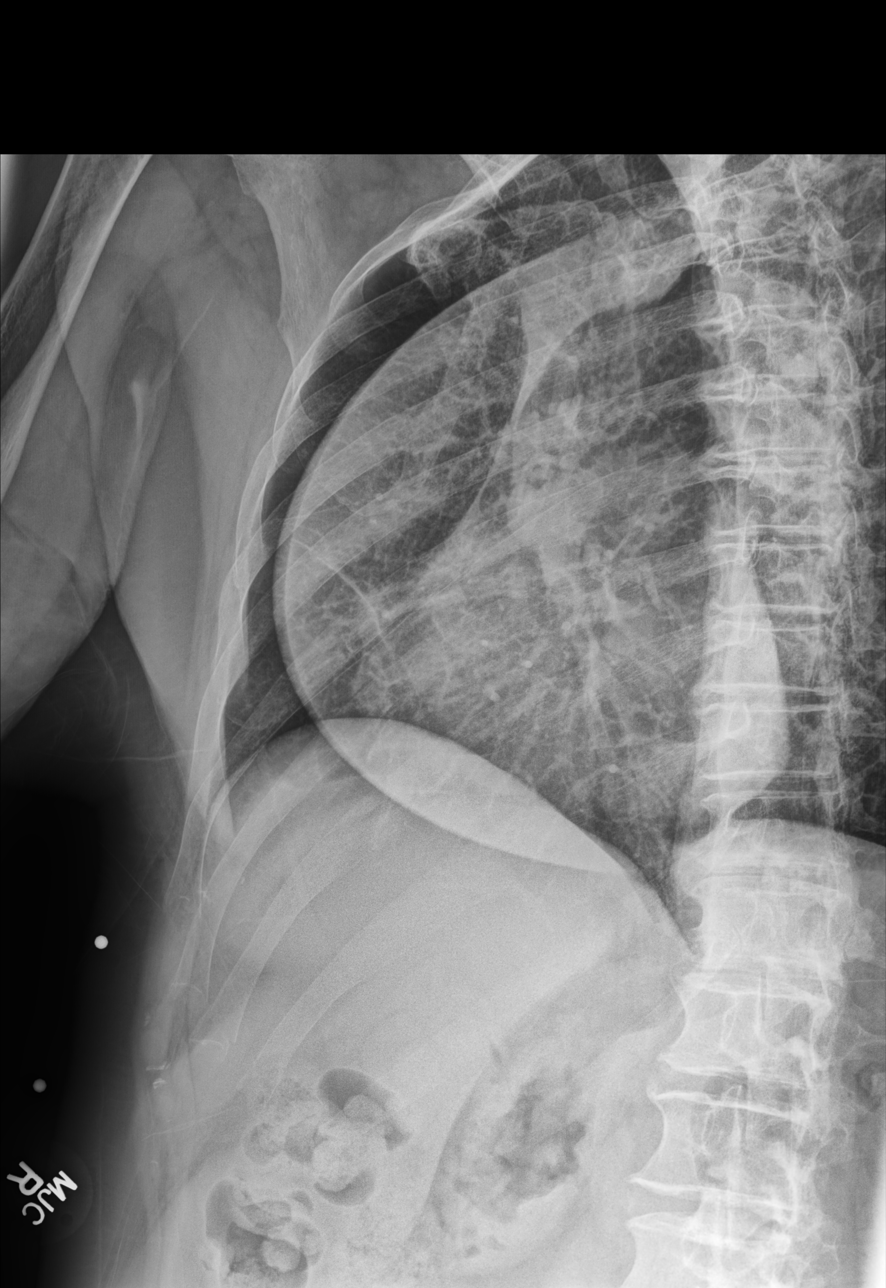

[4 of 4 positions shown; findings below may reference images not displayed]

FINDINGS: Supine AP view of the chest. Lung volumes and mediastinal contours
remain normal. Both lungs appear clear. No pneumothorax or pleural
effusion. Partially visible lower cervical ACDF hardware. Negative
visible bowel gas pattern.

3 oblique views of the right ribs. A rib marker is in place near the
right anterolateral [DATE] rib level, and there is subtle angulation of
the anterior right 9th rib (arrows). No superimposed displaced rib
fracture identified. The visible spine and other osseous structures
appear intact.
IMPRESSION: 1. Appearance suspicious for a nondisplaced right anterior 9th rib
fracture.
2.  No acute cardiopulmonary abnormality.

## 2019-01-29 NOTE — Telephone Encounter (Signed)
Sent to the pharmacy by e-scribe. 

## 2019-02-09 DIAGNOSIS — Z20828 Contact with and (suspected) exposure to other viral communicable diseases: Secondary | ICD-10-CM | POA: Diagnosis not present

## 2019-02-14 ENCOUNTER — Other Ambulatory Visit: Payer: Self-pay

## 2019-02-25 ENCOUNTER — Other Ambulatory Visit: Payer: Self-pay | Admitting: Adult Health

## 2019-02-25 DIAGNOSIS — M810 Age-related osteoporosis without current pathological fracture: Secondary | ICD-10-CM

## 2019-02-27 NOTE — Telephone Encounter (Signed)
DENIED.  PT IS NO LONGER SEEING CORY AS HER PCP.  MESSAGE SENT TO THE PHARMACY.

## 2019-03-30 DIAGNOSIS — Z23 Encounter for immunization: Secondary | ICD-10-CM | POA: Diagnosis not present

## 2019-04-25 ENCOUNTER — Other Ambulatory Visit: Payer: Self-pay | Admitting: Adult Health

## 2019-04-26 NOTE — Telephone Encounter (Signed)
DENIED.  PT HAS CHANGED PRIMARY CARE PROVIDERS.

## 2019-05-01 DIAGNOSIS — F419 Anxiety disorder, unspecified: Secondary | ICD-10-CM | POA: Diagnosis not present

## 2019-08-09 ENCOUNTER — Ambulatory Visit: Payer: Medicare Other | Attending: Internal Medicine

## 2019-08-09 DIAGNOSIS — Z23 Encounter for immunization: Secondary | ICD-10-CM | POA: Insufficient documentation

## 2019-08-09 NOTE — Progress Notes (Signed)
   Covid-19 Vaccination Clinic  Name:  Tonya Pope    MRN: [id number] DOB: 08-03-1950  08/09/2019  [name] was observed post Covid-19 immunization for 15 minutes without incidence. She was provided with Vaccine Information Sheet and instruction to access the V-Safe system.   [name] was instructed to call 911 with any severe reactions post vaccine: Marland Kitchen Difficulty breathing  . Swelling of your face and throat  . A fast heartbeat  . A bad rash all over your body  . Dizziness and weakness    Immunizations Administered    Name Date Dose VIS Date Route   Pfizer COVID-19 Vaccine 08/09/2019 12:07 PM 0.3 mL 06/28/2019 Intramuscular   Manufacturer: [employer], [employer]   Lot: [id number]   NDC: [address]-1

## 2019-08-30 ENCOUNTER — Ambulatory Visit: Payer: Medicare Other | Attending: Internal Medicine

## 2019-08-30 DIAGNOSIS — Z23 Encounter for immunization: Secondary | ICD-10-CM | POA: Insufficient documentation

## 2019-08-30 NOTE — Progress Notes (Signed)
   Covid-19 Vaccination Clinic  Name:  KHRISTI SCHILLER    MRN: [id number] DOB: 29-Dec-1950  08/30/2019  Ms. Elsbernd was observed post Covid-19 immunization for 15 minutes without incidence. She was provided with Vaccine Information Sheet and instruction to access the V-Safe system.   Ms. Griffy was instructed to call 911 with any severe reactions post vaccine: Marland Kitchen Difficulty breathing  . Swelling of your face and throat  . A fast heartbeat  . A bad rash all over your body  . Dizziness and weakness    Immunizations Administered    Name Date Dose VIS Date Route   Pfizer COVID-19 Vaccine 08/30/2019 11:56 AM 0.3 mL 06/28/2019 Intramuscular   Manufacturer: [employer], [employer]   Lot: [id number]   NDC: [address]-8

## 2019-10-30 DIAGNOSIS — Z5181 Encounter for therapeutic drug level monitoring: Secondary | ICD-10-CM | POA: Diagnosis not present

## 2019-10-30 DIAGNOSIS — M810 Age-related osteoporosis without current pathological fracture: Secondary | ICD-10-CM | POA: Diagnosis not present

## 2019-10-30 DIAGNOSIS — Z79899 Other long term (current) drug therapy: Secondary | ICD-10-CM | POA: Diagnosis not present

## 2019-10-30 DIAGNOSIS — Z130 Encounter for screening for diseases of the blood and blood-forming organs and certain disorders involving the immune mechanism: Secondary | ICD-10-CM | POA: Diagnosis not present

## 2019-10-30 DIAGNOSIS — Z13220 Encounter for screening for lipoid disorders: Secondary | ICD-10-CM | POA: Diagnosis not present

## 2019-11-06 DIAGNOSIS — R799 Abnormal finding of blood chemistry, unspecified: Secondary | ICD-10-CM | POA: Diagnosis not present

## 2019-11-06 DIAGNOSIS — Z0000 Encounter for general adult medical examination without abnormal findings: Secondary | ICD-10-CM | POA: Diagnosis not present

## 2019-11-06 DIAGNOSIS — F419 Anxiety disorder, unspecified: Secondary | ICD-10-CM | POA: Diagnosis not present

## 2019-11-11 DIAGNOSIS — H5203 Hypermetropia, bilateral: Secondary | ICD-10-CM | POA: Diagnosis not present

## 2019-11-11 DIAGNOSIS — H401131 Primary open-angle glaucoma, bilateral, mild stage: Secondary | ICD-10-CM | POA: Diagnosis not present

## 2019-11-11 DIAGNOSIS — H2513 Age-related nuclear cataract, bilateral: Secondary | ICD-10-CM | POA: Diagnosis not present

## 2019-11-12 DIAGNOSIS — Z1231 Encounter for screening mammogram for malignant neoplasm of breast: Secondary | ICD-10-CM | POA: Diagnosis not present

## 2019-11-27 DIAGNOSIS — H401131 Primary open-angle glaucoma, bilateral, mild stage: Secondary | ICD-10-CM | POA: Diagnosis not present

## 2019-12-02 DIAGNOSIS — M65322 Trigger finger, left index finger: Secondary | ICD-10-CM

## 2019-12-02 DIAGNOSIS — M1812 Unilateral primary osteoarthritis of first carpometacarpal joint, left hand: Secondary | ICD-10-CM | POA: Diagnosis not present

## 2019-12-02 DIAGNOSIS — M19042 Primary osteoarthritis, left hand: Secondary | ICD-10-CM | POA: Diagnosis not present

## 2019-12-02 HISTORY — DX: Trigger finger, left index finger: M65.322

## 2020-02-10 DIAGNOSIS — R002 Palpitations: Secondary | ICD-10-CM | POA: Diagnosis not present

## 2020-03-29 NOTE — Progress Notes (Signed)
Cardiology Office Note:    Date:  03/30/2020   ID:  Tonya Pope, DOB [birth year], MRN [id number]  PCP:  Virgilio Belling, PA-C  Cardiologist:  Norman Herrlich, MD   Referring MD: Virgilio Belling, PA-C  ASSESSMENT:    1. Palpitations   2. Mitral valve disease    PLAN:    In order of problems listed above:  1. Further evaluation 7-day ZIO monitor to define frequency severity of arrhythmia in particular to exclude atrial fibrillation in her age group and an echocardiogram as she has multiple systolic ejection clicks and history of mitral valve prolapse.  Should avoid over-the-counter proarrhythmic drugs.  I do not think she needs to alter her glaucoma eyedrop.  Next appointment 6 weeks   Medication Adjustments/Labs and Tests Ordered: Current medicines are reviewed at length with the patient today.  Concerns regarding medicines are outlined above.  Orders Placed This Encounter  Procedures  . LONG TERM MONITOR (3-14 DAYS)  . EKG 12-Lead  . ECHOCARDIOGRAM COMPLETE   No orders of the defined types were placed in this encounter.    Chief Complaint  Patient presents with  . Palpitations    History of Present Illness:    Tonya Pope is a 69 y.o. female who is being seen today for the evaluation of palpitation at the request of Virgilio Belling, PA-C.  Chart review reveals an echocardiogram performed 08/09/2017 showing normal left ventricular size and function impaired relaxation with normal filling pressures normal left and right atrial size and no valvular abnormality.  She also utilized a Holter monitor showing rare PVCs and APCs.  TSH was normal 2019.  Recent CMP 10/30/2019 shows a potassium of 4.4 creatinine 0.91 GFR 65 cc cholesterol 203 LDL 102 HDL 89 triglycerides 50 9H normal 1.58 CBC with a hemoglobin of 12.6.  She has a history of mitral valve prolapse palpitation the past.  Recently associated with her glaucoma eyedrops it was worsened in 1 day was  particularly severe with off-and-on forceful contraction.  She does not think she was in atrial fibrillation was seen at a quick care and was told she had PVCs.  She did not have syncope no shortness of breath chest pain no history of congenital or rheumatic heart disease takes no over-the-counter proarrhythmic drugs. Past Medical History:  Diagnosis Date  . Acne   . Anemia   . Anxiety   . Arthritis   . Colon polyps   . Depression   . Diarrhea    C. Diff  . Difficult intubation    patient stated "had to use a smaller tube" in previous surgery in 80's  . GERD (gastroesophageal reflux disease)   . Hyperlipidemia   . Insomnia   . Migraine   . OP (osteoporosis)   . Pneumonia     Past Surgical History:  Procedure Laterality Date  . ABDOMINAL HYSTERECTOMY    . ANTERIOR CERVICAL DECOMP/DISCECTOMY FUSION N/A 01/13/2017   Procedure: ACDF - C4-C5 - C5-C6;  Surgeon: Donalee Citrin, MD;  [facility]: [facility];  Service: Neurosurgery;  Laterality: N/A;  . CARPAL TUNNEL RELEASE Left 11/15/2016   Procedure: LEFT CARPAL TUNNEL RELEASE;  Surgeon: Cindee Salt, MD;  Location: [facility];  Service: Orthopedics;  Laterality: Left;  . CARPAL TUNNEL RELEASE Right 08/18/2016  . COLONOSCOPY W/ POLYPECTOMY    . DIAGNOSTIC LAPAROSCOPY     for endometriosis  . ENDOMETRIAL ABLATION    . TONSILLECTOMY    . TONSILLECTOMY AND ADENOIDECTOMY  Current Medications: Current Meds  Medication Sig  . Calcium Carb-Cholecalciferol (CALCIUM/VITAMIN D) 600-400 MG-UNIT TABS Take 1 tablet by mouth 2 (two) times daily.  . cetirizine (ZYRTEC) 10 MG tablet Take 10 mg by mouth daily.  Marland Kitchen conjugated estrogens (PREMARIN) vaginal cream Place 1 Applicatorful vaginally 2 (two) times a week.  Marland Kitchen HYDROcodone-acetaminophen (NORCO/VICODIN) 5-325 MG tablet Take 1-2 tablets by mouth as needed.  . latanoprost (XALATAN) 0.005 % ophthalmic solution Place 1 drop into both eyes at bedtime.  Marland Kitchen loperamide (IMODIUM A-D) 2 MG tablet  Take 4 mg by mouth as needed for diarrhea or loose stools.  Marland Kitchen loratadine (CLARITIN) 10 MG tablet Take 10 mg by mouth daily.  . meloxicam (MOBIC) 15 MG tablet Take 15 mg by mouth daily.  . methocarbamol (ROBAXIN) 500 MG tablet Take 1 tablet by mouth as needed.  . saccharomyces boulardii (FLORASTOR) 250 MG capsule Take 1 capsule (250 mg total) by mouth 2 (two) times daily. For 2 weeks  . tiZANidine (ZANAFLEX) 4 MG tablet Take 4 mg by mouth at bedtime.  Marland Kitchen venlafaxine XR (EFFEXOR-XR) 75 MG 24 hr capsule Take 75 mg by mouth daily.   Current Facility-Administered Medications for the 03/30/20 encounter (Office Visit) with Baldo Daub, MD  Medication  . 0.9 %  sodium chloride infusion     Allergies:   Epinephrine, Promethazine hcl, Codeine phosphate, and Penicillins   Social History   Socioeconomic History  . Marital status: Widowed    Spouse name: Not on file  . Number of children: Not on file  . Years of education: Not on file  . Highest education level: Not on file  Occupational History  . Not on file  Tobacco Use  . Smoking status: Former Games developer  . Smokeless tobacco: Never Used  Vaping Use  . Vaping Use: Never used  Substance and Sexual Activity  . Alcohol use: Yes    Alcohol/week: 12.0 - 14.0 standard drinks    Types: 12 - 14 Glasses of wine per week    Comment: 2 glasses of wine   . Drug use: No  . Sexual activity: Not on file  Other Topics Concern  . 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    Widowed    No kids   Likes to garden and read.      Social Determinants of Health   Financial Resource Strain:   . Difficulty of Paying Living Expenses: Not on file  Food Insecurity:   . Worried About Programme researcher, broadcasting/film/video in the Last Year: Not on file  . Ran Out of Food in the Last Year: Not on file  Transportation Needs:   . Lack of Transportation (Medical): Not on file  . Lack of Transportation  (Non-Medical): Not on file  Physical Activity:   . Days of Exercise per Week: Not on file  . Minutes of Exercise per Session: Not on file  Stress:   . Feeling of Stress : Not on file  Social Connections:   . Frequency of Communication with Friends and Family: Not on file  . Frequency of Social Gatherings with Friends and Family: Not on file  . Attends Religious Services: Not on file  . Active Member of Clubs or Organizations: Not on file  . Attends Banker Meetings: Not on file  . Marital Status: Not on file     Family History: The patient's family  history includes Arthritis in her mother; Breast cancer in her maternal aunt; Colon cancer in her maternal grandfather and paternal grandmother; Depression in her sister; Diabetes in her mother; Glaucoma in her father; Heart disease in her mother; Hyperlipidemia in her father; Hypertension in her mother; Stroke in her mother; Thyroid disease in her sister.  ROS:   Review of Systems  Constitutional: Negative.  HENT: Negative.   Eyes: Negative.   Cardiovascular: Positive for palpitations.  Respiratory: Negative.   Endocrine: Negative.   Hematologic/Lymphatic: Negative.   Skin: Negative.   Musculoskeletal: Negative.   Gastrointestinal: Negative.   Genitourinary: Negative.   Neurological: Negative.   Psychiatric/Behavioral: Negative.   Allergic/Immunologic: Negative.    Please see the history of present illness.     All other systems reviewed and are negative.  EKGs/Labs/Other Studies Reviewed:    The following studies were reviewed today:   EKG:  EKG is  ordered today.  The ekg ordered today is personally reviewed and demonstrates sinus rhythm normal including QRS morphology ST and T waves and QT intervals  Recent Labs: No results found for requested labs within last 8760 hours.  Recent Lipid Panel    Component Value Date/Time   CHOL 201 (H) 05/09/2018 1401   TRIG 90.0 05/09/2018 1401   HDL 78.80 05/09/2018 1401    CHOLHDL 3 05/09/2018 1401   VLDL 18.0 05/09/2018 1401   LDLCALC 104 (H) 05/09/2018 1401   LDLDIRECT 82.2 04/13/2012 0846    Physical Exam:    VS:  BP (!) 143/77   Pulse 73   Ht 5' 1.5" (1.562 m)   Wt 130 lb 6.4 oz (59.1 kg)   SpO2 97%   BMI 24.24 kg/m     Wt Readings from Last 3 Encounters:  03/30/20 130 lb 6.4 oz (59.1 kg)  05/18/18 133 lb (60.3 kg)  05/09/18 133 lb (60.3 kg)     GEN:  Well nourished, well developed in no acute distress she appears younger than her age HEENT: Normal NECK: No JVD; No carotid bruits LYMPHATICS: No lymphadenopathy CARDIAC: RRR, no murmurs, rubs, gallops RESPIRATORY:  Clear to auscultation without rales, wheezing or rhonchi  ABDOMEN: Soft, non-tender, non-distended MUSCULOSKELETAL:  No edema; No deformity  SKIN: Warm and dry NEUROLOGIC:  Alert and oriented x 3 PSYCHIATRIC:  Normal affect     Signed, Norman Herrlich, MD  03/30/2020 2:31 PM    [facility]

## 2020-03-30 ENCOUNTER — Ambulatory Visit (INDEPENDENT_AMBULATORY_CARE_PROVIDER_SITE_OTHER): Payer: Medicare Other

## 2020-03-30 ENCOUNTER — Encounter: Payer: Self-pay | Admitting: Cardiology

## 2020-03-30 ENCOUNTER — Ambulatory Visit (INDEPENDENT_AMBULATORY_CARE_PROVIDER_SITE_OTHER): Payer: Medicare Other | Admitting: Cardiology

## 2020-03-30 ENCOUNTER — Other Ambulatory Visit: Payer: Self-pay

## 2020-03-30 VITALS — BP 143/77 | HR 73 | Ht 61.5 in | Wt 130.4 lb

## 2020-03-30 DIAGNOSIS — R002 Palpitations: Secondary | ICD-10-CM

## 2020-03-30 DIAGNOSIS — I059 Rheumatic mitral valve disease, unspecified: Secondary | ICD-10-CM | POA: Diagnosis not present

## 2020-03-30 NOTE — Patient Instructions (Addendum)
Medication Instructions:  Your physician recommends that you continue on your current medications as directed. Please refer to the Current Medication list given to you today.  *If you need a refill on your cardiac medications before your next appointment, please call your pharmacy*   Lab Work: None If you have labs (blood work) drawn today and your tests are completely normal, you will receive your results only by: Marland Kitchen MyChart Message (if you have MyChart) OR . A paper copy in the mail If you have any lab test that is abnormal or we need to change your treatment, we will call you to review the results.   Testing/Procedures: Your physician has requested that you have an echocardiogram. Echocardiography is a painless test that uses sound waves to create images of your heart. It provides your doctor with information about the size and shape of your heart and how well your heart's chambers and valves are working. This procedure takes approximately one hour. There are no restrictions for this procedure.  A zio monitor was ordered today. It will remain on for 7 days. You will then return monitor and event diary in provided box. It takes 1-2 weeks for report to be downloaded and returned to [place]. We will call you with the results. If monitor falls off or has orange flashing light, please call Zio for further instructions.      Follow-Up: At [facility], you and your health needs are our priority.  As part of our continuing mission to provide you with exceptional heart care, we have created designated Provider Care Teams.  These Care Teams include your primary Cardiologist (physician) and Advanced Practice Providers (APPs -  Physician Assistants and Nurse Practitioners) who all work together to provide you with the care you need, when you need it.  We recommend signing up for the patient portal called "MyChart".  Sign up information is provided on this After Visit Summary.  MyChart is used to connect  with patients for Virtual Visits (Telemedicine).  Patients are able to view lab/test results, encounter notes, upcoming appointments, etc.  Non-urgent messages can be sent to your provider as well.   To learn more about what you can do with MyChart, go to [email or web address].    Your next appointment:   6 week(s)  The format for your next appointment:   In Person  Provider:   Norman Herrlich, MD   Other Instructions 1. Avoid all over-the-counter antihistamines except Claritin/Loratadine and Zyrtec/Cetrizine. 2. Avoid all combination including cold sinus allergies flu decongestant and sleep medications 3. You can use Robitussin DM Mucinex and Mucinex DM for cough. 4. can use Tylenol aspirin ibuprofen and naproxen but no combinations such as sleep or sinus.

## 2020-04-12 DIAGNOSIS — R002 Palpitations: Secondary | ICD-10-CM | POA: Diagnosis not present

## 2020-04-13 ENCOUNTER — Ambulatory Visit (HOSPITAL_BASED_OUTPATIENT_CLINIC_OR_DEPARTMENT_OTHER)
Admission: RE | Admit: 2020-04-13 | Discharge: 2020-04-13 | Disposition: A | Discharge status: Home / Self Care | Payer: Medicare Other | Source: Ambulatory Visit | Attending: Cardiology | Admitting: Cardiology

## 2020-04-13 ENCOUNTER — Other Ambulatory Visit: Payer: Self-pay

## 2020-04-13 DIAGNOSIS — R002 Palpitations: Secondary | ICD-10-CM | POA: Diagnosis not present

## 2020-04-13 DIAGNOSIS — I059 Rheumatic mitral valve disease, unspecified: Secondary | ICD-10-CM | POA: Diagnosis not present

## 2020-04-13 LAB — ECHOCARDIOGRAM COMPLETE
Area-P 1/2: 5.13 cm2
P 1/2 time: 359 msec
S' Lateral: 2.43 cm

## 2020-04-14 ENCOUNTER — Telehealth: Payer: Self-pay | Admitting: Cardiology

## 2020-04-14 NOTE — Telephone Encounter (Signed)
     I went in pt's chart to see who had called her. The call was transferred to [facility]

## 2020-04-20 ENCOUNTER — Telehealth: Payer: Self-pay

## 2020-04-20 NOTE — Telephone Encounter (Signed)
-----   Message from Brian J Munley, MD sent at 04/20/2020  1:48 PM EDT ----- This monitor is good the number of extra beats she has is not unusual she is aware of them.  If this did not capture her problem I think the adapter for the iPhone, alive cor on [employer] could be of benefit to her.  She would on it and would be able to capture symptomatic events in the future using her phone 

## 2020-04-20 NOTE — Telephone Encounter (Signed)
Left message on patients voicemail to please return our call.   

## 2020-04-21 ENCOUNTER — Telehealth: Payer: Self-pay

## 2020-04-21 NOTE — Telephone Encounter (Signed)
Left message on patients voicemail to please return our call.   

## 2020-04-21 NOTE — Telephone Encounter (Signed)
-----   Message from Brian J Munley, MD sent at 04/20/2020  1:48 PM EDT ----- This monitor is good the number of extra beats she has is not unusual she is aware of them.  If this did not capture her problem I think the adapter for the iPhone, alive cor on [employer] could be of benefit to her.  She would on it and would be able to capture symptomatic events in the future using her phone 

## 2020-04-22 ENCOUNTER — Telehealth: Payer: Self-pay

## 2020-04-22 NOTE — Telephone Encounter (Signed)
Spoke with patient regarding results and recommendation.  Patient verbalizes understanding and is agreeable to plan of care. Advised patient to call back with any issues or concerns.  

## 2020-04-22 NOTE — Telephone Encounter (Signed)
Pt returned call best call back number is [phone]

## 2020-04-22 NOTE — Telephone Encounter (Signed)
-----   Message from Brian J Munley, MD sent at 04/20/2020  1:48 PM EDT ----- This monitor is good the number of extra beats she has is not unusual she is aware of them.  If this did not capture her problem I think the adapter for the iPhone, alive cor on [employer] could be of benefit to her.  She would on it and would be able to capture symptomatic events in the future using her phone 

## 2020-04-22 NOTE — Telephone Encounter (Signed)
-----   Message from Baldo Daub, MD sent at 04/20/2020  1:48 PM EDT ----- This monitor is good the number of extra beats she has is not unusual she is aware of them.  If this did not capture her problem I think the adapter for the iPhone, alive cor on [employer] could be of benefit to her.  She would on it and would be able to capture symptomatic events in the future using her phone

## 2020-04-22 NOTE — Telephone Encounter (Signed)
Left message on patients voicemail to please return our call.   

## 2020-04-25 DIAGNOSIS — Z23 Encounter for immunization: Secondary | ICD-10-CM | POA: Diagnosis not present

## 2020-05-05 DIAGNOSIS — K635 Polyp of colon: Secondary | ICD-10-CM | POA: Insufficient documentation

## 2020-05-05 DIAGNOSIS — M810 Age-related osteoporosis without current pathological fracture: Secondary | ICD-10-CM | POA: Insufficient documentation

## 2020-05-05 DIAGNOSIS — T884XXA Failed or difficult intubation, initial encounter: Secondary | ICD-10-CM | POA: Insufficient documentation

## 2020-05-05 DIAGNOSIS — R197 Diarrhea, unspecified: Secondary | ICD-10-CM | POA: Insufficient documentation

## 2020-05-05 DIAGNOSIS — D649 Anemia, unspecified: Secondary | ICD-10-CM | POA: Insufficient documentation

## 2020-05-05 DIAGNOSIS — E785 Hyperlipidemia, unspecified: Secondary | ICD-10-CM | POA: Insufficient documentation

## 2020-05-05 DIAGNOSIS — M1990 Unspecified osteoarthritis, unspecified site: Secondary | ICD-10-CM | POA: Insufficient documentation

## 2020-05-05 DIAGNOSIS — K219 Gastro-esophageal reflux disease without esophagitis: Secondary | ICD-10-CM | POA: Insufficient documentation

## 2020-05-05 DIAGNOSIS — J189 Pneumonia, unspecified organism: Secondary | ICD-10-CM | POA: Insufficient documentation

## 2020-05-05 DIAGNOSIS — L709 Acne, unspecified: Secondary | ICD-10-CM | POA: Insufficient documentation

## 2020-05-05 DIAGNOSIS — F419 Anxiety disorder, unspecified: Secondary | ICD-10-CM | POA: Insufficient documentation

## 2020-05-06 ENCOUNTER — Ambulatory Visit (INDEPENDENT_AMBULATORY_CARE_PROVIDER_SITE_OTHER): Payer: Medicare Other | Admitting: Cardiology

## 2020-05-06 ENCOUNTER — Encounter: Payer: Self-pay | Admitting: Cardiology

## 2020-05-06 ENCOUNTER — Other Ambulatory Visit: Payer: Self-pay

## 2020-05-06 VITALS — BP 142/76 | HR 91 | Ht 61.5 in | Wt 131.4 lb

## 2020-05-06 DIAGNOSIS — R002 Palpitations: Secondary | ICD-10-CM | POA: Diagnosis not present

## 2020-05-06 NOTE — Progress Notes (Signed)
Cardiology Office Note:    Date:  05/06/2020   ID:  Tonya Pope, DOB [birth year], MRN [id number]  PCP:  Virgilio Belling, PA-C  Cardiologist:  Norman Herrlich, MD    Referring MD: Virgilio Belling, PA-C    ASSESSMENT:    1. Palpitations    PLAN:    In order of problems listed above:  1. Improved perhaps precipitated with sitting antihistamine.  Reassured by the results of monitor and echo at this time hold on any medical therapy.  Should avoid over-the-counter proarrhythmic drugs instructions given to see back in the office as needed   Next appointment: As needed after testing   Medication Adjustments/Labs and Tests Ordered: Current medicines are reviewed at length with the patient today.  Concerns regarding medicines are outlined above.  No orders of the defined types were placed in this encounter.  No orders of the defined types were placed in this encounter.   No chief complaint on file.   History of Present Illness:    Tonya Pope is a 69 y.o. female with a hx of MVP and palpitation last seen 03/30/2020.  Compliance with diet, lifestyle and medications: Yes  I reviewed her testing with her she is reassured symptoms have improved she has been taking over-the-counter sedating antihistamine we will stop.  Zio monitor: Study Highlight A ZIO monitor was performed for 7 days beginning 03/30/2020 to assess palpitation. The cardiac rhythm throughout was sinus with average, minimum of maximum heart rates of 80, 47 and 159 bpm. There were no pauses of 3 seconds or greater and no episodes of second or third-degree AV node block or sinus node exit block. Ventricular ectopy was rare with isolated PVCs Supraventricular ectopy was rare with isolated APCs and one 4 beat run of atrial premature contractions. There is 17 triggered and diary events 12 of which were associated with PVCs or APCs.  Conclusion, although both ventricular and supraventricular ectopy were  rare, the majority of the triggered and diary events are symptomatic APCs or PVCs.  Echo 04/13/2020: 1. Left ventricular ejection fraction, by estimation, is 60 to 65%. The  left ventricle has normal function. Left ventricular endocardial border  not optimally defined to evaluate regional wall motion. Left ventricular  diastolic parameters are consistent  with Grade I diastolic dysfunction (impaired relaxation).  2. Right ventricular systolic function is normal. The right ventricular  size is normal. There is normal pulmonary artery systolic pressure.  3. The mitral valve is normal in structure. No evidence of mitral valve  regurgitation. No evidence of mitral stenosis.  4. The aortic valve is tricuspid. Aortic valve regurgitation is mild. No  aortic stenosis is present.  5. The inferior vena cava is normal in size with greater than 50%  respiratory variability, suggesting right atrial pressure of 3 mmHg.   Past Medical History:  Diagnosis Date  . Abdominal pain, epigastric 03/27/2014  . Achilles tendinitis of right lower extremity 02/12/2018  . Acne   . Anemia   . Anxiety   . Arthritis   . Bone spur of right foot 02/16/2018   Formatting of this note might be different from the original. Added automatically from request for surgery [phone]  . Carpal tunnel syndrome on left 10/31/2016  . Cervical spondylosis without myelopathy 10/31/2016  . Colon polyps   . Depression   . Diarrhea    C. Diff  . Difficult intubation    patient stated "had to use a smaller tube" in previous surgery  in 80's  . GERD 03/28/2007   Qualifier: Diagnosis of  By: Everett Graff    . GERD (gastroesophageal reflux disease)   . Hematuria 03/28/2007   Qualifier: Diagnosis of  By: Everett Graff    . History of colonic polyps 05/08/2008   Qualifier: Diagnosis of  By: Tawanna Cooler MD, Eugenio Hoes   . Hyperlipidemia   . Insomnia   . Medication reaction 12/26/2015  . Migraine   . Mitral valve disease 08/03/2009    Formatting of this note might be different from the original. Overview:  Qualifier: Diagnosis of  By: Tawanna Cooler MD, Eugenio Hoes  . MITRAL VALVE PROLAPSE 08/03/2009   Qualifier: Diagnosis of  By: Tawanna Cooler MD, Eugenio Hoes   . OP (osteoporosis)   . Osteoarthritis of right hip 10/31/2013  . Osteoporosis 03/28/2007   Qualifier: Diagnosis of  By: Tawanna Cooler, RN, Moreen Fowler of this note might be different from the original. Overview:  Qualifier: Diagnosis of  By: Everett Graff  . Panic attacks 02/17/2014  . Pneumonia   . Primary osteoarthritis of first carpometacarpal joint of left hand 10/31/2016  . Spinal stenosis, cervical region 01/13/2017  . Trigger index finger of left hand 12/02/2019    Past Surgical History:  Procedure Laterality Date  . ABDOMINAL HYSTERECTOMY    . ANTERIOR CERVICAL DECOMP/DISCECTOMY FUSION N/A 01/13/2017   Procedure: ACDF - C4-C5 - C5-C6;  Surgeon: Donalee Citrin, MD;  Location: [facility];  Service: Neurosurgery;  Laterality: N/A;  . CARPAL TUNNEL RELEASE Left 11/15/2016   Procedure: LEFT CARPAL TUNNEL RELEASE;  Surgeon: Cindee Salt, MD;  Location: [facility];  Service: Orthopedics;  Laterality: Left;  . CARPAL TUNNEL RELEASE Right 08/18/2016  . COLONOSCOPY W/ POLYPECTOMY    . DIAGNOSTIC LAPAROSCOPY     for endometriosis  . ENDOMETRIAL ABLATION    . TONSILLECTOMY    . TONSILLECTOMY AND ADENOIDECTOMY      Current Medications: Current Meds  Medication Sig  . Calcium Carb-Cholecalciferol (CALCIUM/VITAMIN D) 600-400 MG-UNIT TABS Take 1 tablet by mouth 2 (two) times daily.  Marland Kitchen conjugated estrogens (PREMARIN) vaginal cream Place 1 Applicatorful vaginally 2 (two) times a week.  Marland Kitchen doxylamine, Sleep, (UNISOM) 25 MG tablet Take 25 mg by mouth at bedtime as needed.  Marland Kitchen HYDROcodone-acetaminophen (NORCO/VICODIN) 5-325 MG tablet Take 1-2 tablets by mouth as needed.  . latanoprost (XALATAN) 0.005 % ophthalmic solution Place 1 drop into both eyes at bedtime.  Marland Kitchen loperamide (IMODIUM A-D) 2  MG tablet Take 4 mg by mouth as needed for diarrhea or loose stools.  Marland Kitchen loratadine (CLARITIN) 10 MG tablet Take 10 mg by mouth daily.  . meloxicam (MOBIC) 15 MG tablet Take 15 mg by mouth daily.  . methocarbamol (ROBAXIN) 500 MG tablet Take 1 tablet by mouth as needed.  . saccharomyces boulardii (FLORASTOR) 250 MG capsule Take 1 capsule (250 mg total) by mouth 2 (two) times daily. For 2 weeks  . tiZANidine (ZANAFLEX) 4 MG tablet Take 4 mg by mouth at bedtime.  Marland Kitchen venlafaxine XR (EFFEXOR-XR) 75 MG 24 hr capsule Take 75 mg by mouth daily.   Current Facility-Administered Medications for the 05/06/20 encounter (Office Visit) with Baldo Daub, MD  Medication  . 0.9 %  sodium chloride infusion     Allergies:   Epinephrine, Promethazine hcl, Codeine phosphate, and Penicillins   Social History   Socioeconomic History  . Marital status: Widowed    Spouse name: Not on file  . Number  of children: Not on file  . Years of education: Not on file  . Highest education level: Not on file  Occupational History  . Not on file  Tobacco Use  . Smoking status: Former Games developermoker  . Smokeless tobacco: Never Used  Vaping Use  . Vaping Use: Never used  Substance and Sexual Activity  . Alcohol use: Yes    Alcohol/week: 12.0 - 14.0 standard drinks    Types: 12 - 14 Glasses of wine per week    Comment: 2 glasses of wine   . Drug use: No  . Sexual activity: Not on file  Other Topics Concern  . Not on file  Social History Narrative   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    Widowed    No kids   Likes to garden and read.      Social Determinants of Health   Financial Resource Strain:   . Difficulty of Paying Living Expenses: Not on file  Food Insecurity:   . Worried About Programme researcher, broadcasting/film/videounning Out of Food in the Last Year: Not on file  . Ran Out of Food in the Last Year: Not on file  Transportation Needs:   . Lack of Transportation (Medical): Not on file  . Lack of Transportation  (Non-Medical): Not on file  Physical Activity:   . Days of Exercise per Week: Not on file  . Minutes of Exercise per Session: Not on file  Stress:   . Feeling of Stress : Not on file  Social Connections:   . Frequency of Communication with Friends and Family: Not on file  . Frequency of Social Gatherings with Friends and Family: Not on file  . Attends Religious Services: Not on file  . Active Member of Clubs or Organizations: Not on file  . Attends BankerClub or Organization Meetings: Not on file  . Marital Status: Not on file     Family History: The patient's family history includes Arthritis in her mother; Breast cancer in her maternal aunt; Colon cancer in her maternal grandfather and paternal grandmother; Depression in her sister; Diabetes in her mother; Glaucoma in her father; Heart disease in her mother; Hyperlipidemia in her father; Hypertension in her mother; Stroke in her mother; Thyroid disease in her sister. ROS:   Please see the history of present illness.    All other systems reviewed and are negative.  EKGs/Labs/Other Studies Reviewed:    The following studies were reviewed today:    Recent Labs: No results found for requested labs within last 8760 hours.  Recent Lipid Panel    Component Value Date/Time   CHOL 201 (H) 05/09/2018 1401   TRIG 90.0 05/09/2018 1401   HDL 78.80 05/09/2018 1401   CHOLHDL 3 05/09/2018 1401   VLDL 18.0 05/09/2018 1401   LDLCALC 104 (H) 05/09/2018 1401   LDLDIRECT 82.2 04/13/2012 0846    Physical Exam:    VS:  BP (!) 142/76 (BP Location: Right Arm, Patient Position: Sitting, Cuff Size: Normal)   Pulse 91   Ht 5' 1.5" (1.562 m)   Wt 131 lb 6.4 oz (59.6 kg)   SpO2 96%   BMI 24.43 kg/m     Wt Readings from Last 3 Encounters:  05/06/20 131 lb 6.4 oz (59.6 kg)  03/30/20 130 lb 6.4 oz (59.1 kg)  05/18/18 133 lb (60.3 kg)     GEN:  Well nourished, well developed in no acute distress HEENT: Normal NECK: No JVD; No carotid  bruits LYMPHATICS: No lymphadenopathy CARDIAC: RRR, no murmurs, rubs, gallops RESPIRATORY:  Clear to auscultation without rales, wheezing or rhonchi  ABDOMEN: Soft, non-tender, non-distended MUSCULOSKELETAL:  No edema; No deformity  SKIN: Warm and dry NEUROLOGIC:  Alert and oriented x 3 PSYCHIATRIC:  Normal affect    Signed, Norman Herrlich, MD  05/06/2020 3:17 PM     [facility]

## 2020-05-06 NOTE — Patient Instructions (Addendum)
Medication Instructions:  Your physician recommends that you continue on your current medications as directed. Please refer to the Current Medication list given to you today.  *If you need a refill on your cardiac medications before your next appointment, please call your pharmacy*   Lab Work: None If you have labs (blood work) drawn today and your tests are completely normal, you will receive your results only by: . MyChart Message (if you have MyChart) OR . A paper copy in the mail If you have any lab test that is abnormal or we need to change your treatment, we will call you to review the results.   Testing/Procedures: None   Follow-Up: At [facility], you and your health needs are our priority.  As part of our continuing mission to provide you with exceptional heart care, we have created designated Provider Care Teams.  These Care Teams include your primary Cardiologist (physician) and Advanced Practice Providers (APPs -  Physician Assistants and Nurse Practitioners) who all work together to provide you with the care you need, when you need it.  We recommend signing up for the patient portal called "MyChart".  Sign up information is provided on this After Visit Summary.  MyChart is used to connect with patients for Virtual Visits (Telemedicine).  Patients are able to view lab/test results, encounter notes, upcoming appointments, etc.  Non-urgent messages can be sent to your provider as well.   To learn more about what you can do with MyChart, go to [email or web address].    Your next appointment:   As needed  The format for your next appointment:   In Person  Provider:   Brian Munley, MD   Other Instructions 1. Avoid all over-the-counter antihistamines except Claritin/Loratadine and Zyrtec/Cetrizine. 2. Avoid all combination including cold sinus allergies flu decongestant and sleep medications 3. You can use Robitussin DM Mucinex and Mucinex DM for cough. 4. can use  Tylenol aspirin ibuprofen and naproxen but no combinations such as sleep or sinus. 

## 2020-05-11 DIAGNOSIS — F32A Depression, unspecified: Secondary | ICD-10-CM | POA: Diagnosis not present

## 2020-05-11 DIAGNOSIS — F419 Anxiety disorder, unspecified: Secondary | ICD-10-CM | POA: Diagnosis not present

## 2020-05-11 DIAGNOSIS — Z23 Encounter for immunization: Secondary | ICD-10-CM | POA: Diagnosis not present

## 2020-05-18 ENCOUNTER — Ambulatory Visit: Payer: Medicare Other | Admitting: Cardiology

## 2020-06-03 DIAGNOSIS — H401131 Primary open-angle glaucoma, bilateral, mild stage: Secondary | ICD-10-CM | POA: Diagnosis not present

## 2020-06-03 DIAGNOSIS — H2513 Age-related nuclear cataract, bilateral: Secondary | ICD-10-CM | POA: Diagnosis not present

## 2020-09-09 DIAGNOSIS — M65352 Trigger finger, left little finger: Secondary | ICD-10-CM | POA: Diagnosis not present

## 2020-09-09 DIAGNOSIS — M65342 Trigger finger, left ring finger: Secondary | ICD-10-CM | POA: Diagnosis not present

## 2020-09-09 DIAGNOSIS — M65321 Trigger finger, right index finger: Secondary | ICD-10-CM | POA: Diagnosis not present

## 2020-09-09 DIAGNOSIS — M65331 Trigger finger, right middle finger: Secondary | ICD-10-CM | POA: Diagnosis not present

## 2020-09-15 DIAGNOSIS — R000 Tachycardia, unspecified: Secondary | ICD-10-CM | POA: Diagnosis not present

## 2020-09-15 DIAGNOSIS — R002 Palpitations: Secondary | ICD-10-CM | POA: Diagnosis not present

## 2020-09-15 DIAGNOSIS — F419 Anxiety disorder, unspecified: Secondary | ICD-10-CM | POA: Diagnosis not present

## 2020-09-16 DIAGNOSIS — R7309 Other abnormal glucose: Secondary | ICD-10-CM | POA: Diagnosis not present

## 2020-09-16 DIAGNOSIS — R000 Tachycardia, unspecified: Secondary | ICD-10-CM | POA: Diagnosis not present

## 2020-09-16 DIAGNOSIS — R002 Palpitations: Secondary | ICD-10-CM | POA: Diagnosis not present

## 2020-09-30 DIAGNOSIS — M65341 Trigger finger, right ring finger: Secondary | ICD-10-CM | POA: Diagnosis not present

## 2020-10-01 DIAGNOSIS — F419 Anxiety disorder, unspecified: Secondary | ICD-10-CM | POA: Diagnosis not present

## 2020-11-09 DIAGNOSIS — Z87891 Personal history of nicotine dependence: Secondary | ICD-10-CM | POA: Diagnosis not present

## 2020-11-09 DIAGNOSIS — Z0000 Encounter for general adult medical examination without abnormal findings: Secondary | ICD-10-CM | POA: Diagnosis not present

## 2020-11-09 DIAGNOSIS — F411 Generalized anxiety disorder: Secondary | ICD-10-CM | POA: Diagnosis not present

## 2020-11-09 DIAGNOSIS — Z79899 Other long term (current) drug therapy: Secondary | ICD-10-CM | POA: Diagnosis not present

## 2020-11-09 DIAGNOSIS — M1611 Unilateral primary osteoarthritis, right hip: Secondary | ICD-10-CM | POA: Diagnosis not present

## 2020-11-30 DIAGNOSIS — H401131 Primary open-angle glaucoma, bilateral, mild stage: Secondary | ICD-10-CM | POA: Diagnosis not present

## 2020-12-01 DIAGNOSIS — Z1231 Encounter for screening mammogram for malignant neoplasm of breast: Secondary | ICD-10-CM | POA: Diagnosis not present

## 2020-12-15 ENCOUNTER — Emergency Department (HOSPITAL_COMMUNITY): Payer: Medicare Other

## 2020-12-15 ENCOUNTER — Encounter (HOSPITAL_COMMUNITY): Payer: Self-pay | Admitting: Emergency Medicine

## 2020-12-15 ENCOUNTER — Emergency Department (HOSPITAL_COMMUNITY)
Admission: EM | Admit: 2020-12-15 | Discharge: 2020-12-15 | Disposition: A | Discharge status: Home / Self Care | Payer: Medicare Other | Attending: Emergency Medicine | Admitting: Emergency Medicine

## 2020-12-15 ENCOUNTER — Other Ambulatory Visit: Payer: Self-pay

## 2020-12-15 DIAGNOSIS — Z20822 Contact with and (suspected) exposure to covid-19: Secondary | ICD-10-CM | POA: Diagnosis not present

## 2020-12-15 DIAGNOSIS — R059 Cough, unspecified: Secondary | ICD-10-CM | POA: Diagnosis not present

## 2020-12-15 DIAGNOSIS — Z87891 Personal history of nicotine dependence: Secondary | ICD-10-CM | POA: Diagnosis not present

## 2020-12-15 DIAGNOSIS — R509 Fever, unspecified: Secondary | ICD-10-CM | POA: Insufficient documentation

## 2020-12-15 DIAGNOSIS — R519 Headache, unspecified: Secondary | ICD-10-CM | POA: Insufficient documentation

## 2020-12-15 DIAGNOSIS — R0789 Other chest pain: Secondary | ICD-10-CM | POA: Diagnosis not present

## 2020-12-15 DIAGNOSIS — A419 Sepsis, unspecified organism: Secondary | ICD-10-CM | POA: Diagnosis not present

## 2020-12-15 DIAGNOSIS — J029 Acute pharyngitis, unspecified: Secondary | ICD-10-CM | POA: Insufficient documentation

## 2020-12-15 DIAGNOSIS — R062 Wheezing: Secondary | ICD-10-CM | POA: Diagnosis not present

## 2020-12-15 DIAGNOSIS — R000 Tachycardia, unspecified: Secondary | ICD-10-CM | POA: Diagnosis not present

## 2020-12-15 DIAGNOSIS — R109 Unspecified abdominal pain: Secondary | ICD-10-CM | POA: Diagnosis not present

## 2020-12-15 DIAGNOSIS — R0603 Acute respiratory distress: Secondary | ICD-10-CM | POA: Diagnosis not present

## 2020-12-15 DIAGNOSIS — R079 Chest pain, unspecified: Secondary | ICD-10-CM | POA: Diagnosis not present

## 2020-12-15 DIAGNOSIS — J189 Pneumonia, unspecified organism: Secondary | ICD-10-CM | POA: Diagnosis not present

## 2020-12-15 DIAGNOSIS — J9811 Atelectasis: Secondary | ICD-10-CM | POA: Diagnosis not present

## 2020-12-15 DIAGNOSIS — R0902 Hypoxemia: Secondary | ICD-10-CM | POA: Diagnosis not present

## 2020-12-15 LAB — COMPREHENSIVE METABOLIC PANEL
ALT: 49 U/L — ABNORMAL HIGH (ref 0–44)
AST: 35 U/L (ref 15–41)
Albumin: 4.3 g/dL (ref 3.5–5.0)
Alkaline Phosphatase: 56 U/L (ref 38–126)
Anion gap: 9 (ref 5–15)
BUN: 13 mg/dL (ref 8–23)
CO2: 23 mmol/L (ref 22–32)
Calcium: 9.3 mg/dL (ref 8.9–10.3)
Chloride: 103 mmol/L (ref 98–111)
Creatinine, Ser: 1.05 mg/dL — ABNORMAL HIGH (ref 0.44–1.00)
GFR, Estimated: 57 mL/min — ABNORMAL LOW (ref >60)
Glucose, Bld: 110 mg/dL — ABNORMAL HIGH (ref 70–99)
Potassium: 3.3 mmol/L — ABNORMAL LOW (ref 3.5–5.1)
Sodium: 135 mmol/L (ref 135–145)
Total Bilirubin: 0.6 mg/dL (ref 0.3–1.2)
Total Protein: 7 g/dL (ref 6.5–8.1)

## 2020-12-15 LAB — URINALYSIS, ROUTINE W REFLEX MICROSCOPIC
Bilirubin Urine: NEGATIVE
Glucose, UA: NEGATIVE mg/dL
Hgb urine dipstick: NEGATIVE
Ketones, ur: 20 mg/dL — AB
Leukocytes,Ua: NEGATIVE
Nitrite: NEGATIVE
Protein, ur: NEGATIVE mg/dL
Specific Gravity, Urine: 1.014 (ref 1.005–1.030)
pH: 5 (ref 5.0–8.0)

## 2020-12-15 LAB — CBC WITH DIFFERENTIAL/PLATELET
Abs Immature Granulocytes: 0.03 10*3/uL (ref 0.00–0.07)
Basophils Absolute: 0 10*3/uL (ref 0.0–0.1)
Basophils Relative: 0 %
Eosinophils Absolute: 0 10*3/uL (ref 0.0–0.5)
Eosinophils Relative: 0 %
HCT: 36.6 % (ref 36.0–46.0)
Hemoglobin: 12.3 g/dL (ref 12.0–15.0)
Immature Granulocytes: 1 %
Lymphocytes Relative: 9 %
Lymphs Abs: 0.5 10*3/uL — ABNORMAL LOW (ref 0.7–4.0)
MCH: 32 pg (ref 26.0–34.0)
MCHC: 33.6 g/dL (ref 30.0–36.0)
MCV: 95.3 fL (ref 80.0–100.0)
Monocytes Absolute: 0.3 10*3/uL (ref 0.1–1.0)
Monocytes Relative: 6 %
Neutro Abs: 4.5 10*3/uL (ref 1.7–7.7)
Neutrophils Relative %: 84 %
Platelets: 161 10*3/uL (ref 150–400)
RBC: 3.84 MIL/uL — ABNORMAL LOW (ref 3.87–5.11)
RDW: 13.9 % (ref 11.5–15.5)
WBC: 5.3 10*3/uL (ref 4.0–10.5)
nRBC: 0 % (ref 0.0–0.2)

## 2020-12-15 LAB — RESP PANEL BY RT-PCR (FLU A&B, COVID) ARPGX2
Influenza A by PCR: NEGATIVE
Influenza B by PCR: NEGATIVE
SARS Coronavirus 2 by RT PCR: NEGATIVE

## 2020-12-15 LAB — PROTIME-INR
INR: 1 (ref 0.8–1.2)
Prothrombin Time: 12.9 seconds (ref 11.4–15.2)

## 2020-12-15 LAB — LACTIC ACID, PLASMA
Lactic Acid, Venous: 1.6 mmol/L (ref 0.5–1.9)
Lactic Acid, Venous: 1.3 mmol/L (ref 0.5–1.9)

## 2020-12-15 MED ORDER — HYDROMORPHONE HCL 1 MG/ML IJ SOLN
0.5000 mg | Freq: Once | INTRAMUSCULAR | Status: AC
Start: 2020-12-15 — End: 2020-12-15
  Administered 2020-12-15: 0.5 mg via INTRAVENOUS
  Filled 2020-12-15: qty 1

## 2020-12-15 MED ORDER — ONDANSETRON HCL 4 MG/2ML IJ SOLN
4.0000 mg | Freq: Once | INTRAMUSCULAR | Status: AC
  Administered 2020-12-15: 4 mg via INTRAVENOUS
  Filled 2020-12-15: qty 2

## 2020-12-15 MED ORDER — SODIUM CHLORIDE 0.9 % IV SOLN
500.0000 mg | Freq: Once | INTRAVENOUS | Status: AC
  Administered 2020-12-15: 500 mg via INTRAVENOUS
  Filled 2020-12-15: qty 500

## 2020-12-15 MED ORDER — KETOROLAC TROMETHAMINE 30 MG/ML IJ SOLN
15.0000 mg | Freq: Once | INTRAMUSCULAR | Status: AC
  Administered 2020-12-15: 15 mg via INTRAVENOUS
  Filled 2020-12-15: qty 1

## 2020-12-15 MED ORDER — ACETAMINOPHEN 325 MG PO TABS
650.0000 mg | ORAL_TABLET | Freq: Once | ORAL | Status: AC
  Administered 2020-12-15: 650 mg via ORAL
  Filled 2020-12-15: qty 2

## 2020-12-15 MED ORDER — DOXYCYCLINE HYCLATE 100 MG PO CAPS: 100.0000 mg | ORAL_CAPSULE | Freq: Two times a day (BID) | ORAL | 0 refills | Status: DC

## 2020-12-15 MED ORDER — SODIUM CHLORIDE 0.9 % IV SOLN
1.0000 g | Freq: Once | INTRAVENOUS | Status: AC
  Administered 2020-12-15: 1 g via INTRAVENOUS
  Filled 2020-12-15: qty 10

## 2020-12-15 MED ORDER — IOHEXOL 350 MG/ML SOLN
75.0000 mL | Freq: Once | INTRAVENOUS | Status: AC | PRN
  Administered 2020-12-15: 75 mL via INTRAVENOUS

## 2020-12-15 MED ORDER — POTASSIUM CHLORIDE CRYS ER 20 MEQ PO TBCR
40.0000 meq | EXTENDED_RELEASE_TABLET | Freq: Once | ORAL | Status: AC
  Administered 2020-12-15: 40 meq via ORAL
  Filled 2020-12-15: qty 2

## 2020-12-15 MED ORDER — ONDANSETRON 4 MG PO TBDP: 4.0000 mg | ORAL_TABLET | Freq: Three times a day (TID) | ORAL | 0 refills | Status: DC | PRN

## 2020-12-15 MED ORDER — FENTANYL CITRATE (PF) 100 MCG/2ML IJ SOLN
100.0000 ug | Freq: Once | INTRAMUSCULAR | Status: AC
  Administered 2020-12-15: 100 ug via INTRAVENOUS
  Filled 2020-12-15: qty 2

## 2020-12-15 MED ORDER — SODIUM CHLORIDE 0.9 % IV BOLUS
1000.0000 mL | Freq: Once | INTRAVENOUS | Status: AC
  Administered 2020-12-15: 1000 mL via INTRAVENOUS

## 2020-12-15 NOTE — ED Triage Notes (Signed)
C/o fever, productive cough with beige phlegm, body aches, chills, SOB, nausea, and R sided back pain since Wednesday.  Reports 3 negative COVID test and negative flu test.

## 2020-12-15 NOTE — ED Provider Notes (Signed)
[facility] EMERGENCY DEPARTMENT Provider Note   CSN: [id number] Arrival date & time: 12/15/20  1121     History No chief complaint on file.   Tonya Pope is a 70 y.o. female.  HPI 70 year old female cough.  She has been feeling ill for about a week.  Started off with sore throat and scratchy throat.  Some pain in her right flank.  Since yesterday has been having a productive cough with tan sputum.  Has had fever today as well.  Also having headache.  She feels short of breath.  No vomiting.  Has taken home COVID test that were negative and went to urgent care today where she had a negative COVID and flu test.  She was given a shot of steroids and antibiotics. Her throat is sore but she has no problem swallowing.  Past Medical History:  Diagnosis Date  . Abdominal pain, epigastric 03/27/2014  . Achilles tendinitis of right lower extremity 02/12/2018  . Acne   . Anemia   . Anxiety   . Arthritis   . Bone spur of right foot 02/16/2018   Formatting of this note might be different from the original. Added automatically from request for surgery [phone]  . Carpal tunnel syndrome on left 10/31/2016  . Cervical spondylosis without myelopathy 10/31/2016  . Colon polyps   . Depression   . Diarrhea    C. Diff  . Difficult intubation    patient stated "had to use a smaller tube" in previous surgery in 80's  . GERD 03/28/2007   Qualifier: Diagnosis of  By: Everett Graff    . GERD (gastroesophageal reflux disease)   . Hematuria 03/28/2007   Qualifier: Diagnosis of  By: Everett Graff    . History of colonic polyps 05/08/2008   Qualifier: Diagnosis of  By: Tawanna Cooler MD, Eugenio Hoes   . Hyperlipidemia   . Insomnia   . Medication reaction 12/26/2015  . Migraine   . Mitral valve disease 08/03/2009   Formatting of this note might be different from the original. Overview:  Qualifier: Diagnosis of  By: Tawanna Cooler MD, Eugenio Hoes  . MITRAL VALVE PROLAPSE 08/03/2009   Qualifier: Diagnosis of   By: Tawanna Cooler MD, Eugenio Hoes   . OP (osteoporosis)   . Osteoarthritis of right hip 10/31/2013  . Osteoporosis 03/28/2007   Qualifier: Diagnosis of  By: Tawanna Cooler, RN, Moreen Fowler of this note might be different from the original. Overview:  Qualifier: Diagnosis of  By: Everett Graff  . Panic attacks 02/17/2014  . Pneumonia   . Primary osteoarthritis of first carpometacarpal joint of left hand 10/31/2016  . Spinal stenosis, cervical region 01/13/2017  . Trigger index finger of left hand 12/02/2019    Patient Active Problem List   Diagnosis Date Noted  . Pneumonia   . OP (osteoporosis)   . Hyperlipidemia   . GERD (gastroesophageal reflux disease)   . Difficult intubation   . Diarrhea   . Colon polyps   . Arthritis   . Anxiety   . Anemia   . Acne   . Trigger index finger of left hand 12/02/2019  . Bone spur of right foot 02/16/2018  . Achilles tendinitis of right lower extremity 02/12/2018  . Spinal stenosis, cervical region 01/13/2017  . Carpal tunnel syndrome on left 10/31/2016  . Cervical spondylosis without myelopathy 10/31/2016  . Primary osteoarthritis of first carpometacarpal joint of left hand 10/31/2016  . Medication reaction  12/26/2015  . Abdominal pain, epigastric 03/27/2014  . Panic attacks 02/17/2014  . Osteoarthritis of right hip 10/31/2013  . MITRAL VALVE PROLAPSE 08/03/2009  . Mitral valve disease 08/03/2009  . History of colonic polyps 05/08/2008  . Depression 04/17/2008  . GERD 03/28/2007  . Hematuria 03/28/2007  . Osteoporosis 03/28/2007  . Insomnia 03/28/2007    Past Surgical History:  Procedure Laterality Date  . ABDOMINAL HYSTERECTOMY    . ANTERIOR CERVICAL DECOMP/DISCECTOMY FUSION N/A 01/13/2017   Procedure: ACDF - C4-C5 - C5-C6;  Surgeon: Donalee Citrinram, Gary, MD;  Location: [facility] OR;  Service: Neurosurgery;  Laterality: N/A;  . CARPAL TUNNEL RELEASE Left 11/15/2016   Procedure: LEFT CARPAL TUNNEL RELEASE;  Surgeon: Cindee SaltKuzma, Gary, MD;  Location: [facility];  Service: Orthopedics;  Laterality: Left;  . CARPAL TUNNEL RELEASE Right 08/18/2016  . COLONOSCOPY W/ POLYPECTOMY    . DIAGNOSTIC LAPAROSCOPY     for endometriosis  . ENDOMETRIAL ABLATION    . TONSILLECTOMY    . TONSILLECTOMY AND ADENOIDECTOMY       OB History   No obstetric history on file.     Family History  Problem Relation Age of Onset  . Stroke Mother   . Arthritis Mother   . Diabetes Mother   . Heart disease Mother   . Hypertension Mother   . Hyperlipidemia Father   . Glaucoma Father   . Thyroid disease Sister   . Breast cancer Maternal Aunt   . Colon cancer Maternal Grandfather   . Depression Sister   . Colon cancer Paternal Grandmother     Social History   Tobacco Use  . Smoking status: Former Games developermoker  . Smokeless tobacco: Never Used  Vaping Use  . Vaping Use: Never used  Substance Use Topics  . Alcohol use: Yes    Alcohol/week: 12.0 - 14.0 standard drinks    Types: 12 - 14 Glasses of wine per week    Comment: 2 glasses of wine   . Drug use: No    Home Medications Prior to Admission medications   Medication Sig Start Date End Date Taking? Authorizing Provider  ALPRAZolam Prudy Feeler(XANAX) 0.25 MG tablet Take 0.25 mg by mouth daily as needed for anxiety. 10/02/20  Yes [provider]  cetirizine (ZYRTEC) 10 MG tablet Take 10 mg by mouth daily.   Yes [provider]  conjugated estrogens (PREMARIN) vaginal cream Place 1 Applicatorful vaginally 2 (two) times a week. 05/10/18  Yes Nafziger, Kandee Keenory, NP  HYDROcodone-acetaminophen (NORCO/VICODIN) 5-325 MG tablet Take 1-2 tablets by mouth as needed for moderate pain or severe pain. 06/11/18  Yes [provider]  latanoprost (XALATAN) 0.005 % ophthalmic solution Place 1 drop into both eyes at bedtime. 02/05/20  Yes [provider]  loperamide (IMODIUM A-D) 2 MG tablet Take 4 mg by mouth as needed for diarrhea or loose stools.   Yes [provider]  methocarbamol (ROBAXIN)  500 MG tablet Take 1 tablet by mouth as needed for muscle spasms. 09/12/19  Yes [provider]  pantoprazole (PROTONIX) 40 MG tablet Take 1 tablet (40 mg total) by mouth 2 (two) times daily. Patient taking differently: Take 40 mg by mouth daily. 05/09/18 08/07/18 Yes Nafziger, Kandee Keenory, NP  saccharomyces boulardii (FLORASTOR) 250 MG capsule Take 1 capsule (250 mg total) by mouth 2 (two) times daily. For 2 weeks 04/24/17  Yes Hilarie FredricksonPerry, John N, MD  tiZANidine (ZANAFLEX) 4 MG tablet Take 4 mg by mouth at bedtime. 03/28/20  Yes [provider]  venlafaxine XR (EFFEXOR-XR) 75 MG 24 hr capsule Take 75 mg by mouth daily. 03/06/20  Yes [provider]  Calcium Carb-Cholecalciferol (CALCIUM/VITAMIN D) 600-400 MG-UNIT TABS Take 1 tablet by mouth 2 (two) times daily.    [provider]    Allergies    Epinephrine, Promethazine hcl, Codeine phosphate, and Penicillins  Review of Systems   Review of Systems  Constitutional: Positive for fever.  Respiratory: Positive for cough and shortness of breath.   Cardiovascular: Negative for chest pain.  Gastrointestinal: Negative for abdominal pain and vomiting.  Genitourinary: Positive for flank pain.  All other systems reviewed and are negative.   Physical Exam Updated Vital Signs BP (!) 137/118   Pulse 98   Temp 99.2 F (37.3 C)   Resp 19   SpO2 95%   Physical Exam Vitals and nursing note reviewed.  Constitutional:      General: She is not in acute distress.    Appearance: She is well-developed. She is not ill-appearing or diaphoretic.  HENT:     Head: Normocephalic and atraumatic.     Right Ear: External ear normal.     Left Ear: External ear normal.     Nose: Nose normal.     Mouth/Throat:     Pharynx: No oropharyngeal exudate.  Eyes:     General:        Right eye: No discharge.        Left eye: No discharge.  Cardiovascular:     Rate and Rhythm: Regular rhythm. Tachycardia present.     Heart sounds: Normal  heart sounds.     Comments: HR~100 Pulmonary:     Effort: Pulmonary effort is normal.     Breath sounds: Rales (mild, R>L) present.    Chest:     Chest wall: Tenderness present.  Abdominal:     General: There is no distension.     Palpations: Abdomen is soft.     Tenderness: There is no abdominal tenderness.  Skin:    General: Skin is warm and dry.  Neurological:     Mental Status: She is alert.  Psychiatric:        Mood and Affect: Mood is not anxious.     ED Results / Procedures / Treatments   Labs (all labs ordered are listed, but only abnormal results are displayed) Labs Reviewed  COMPREHENSIVE METABOLIC PANEL - Abnormal; Notable for the following components:      Result Value   Potassium 3.3 (*)    Glucose, Bld 110 (*)    Creatinine, Ser 1.05 (*)    ALT 49 (*)    GFR, Estimated 57 (*)    All other components within normal limits  CBC WITH DIFFERENTIAL/PLATELET - Abnormal; Notable for the following components:   RBC 3.84 (*)    Lymphs Abs 0.5 (*)    All other components within normal limits  RESP PANEL BY RT-PCR (FLU A&B, COVID) ARPGX2  CULTURE, BLOOD (ROUTINE X 2)  CULTURE, BLOOD (ROUTINE X 2)  LACTIC ACID, PLASMA  LACTIC ACID, PLASMA  PROTIME-INR  URINALYSIS, ROUTINE W REFLEX MICROSCOPIC    EKG EKG Interpretation  Date/Time:  Tuesday Dec 15 2020 12:06:20 EDT Ventricular Rate:  101 PR Interval:  136 QRS Duration: 74 QT Interval:  346 QTC Calculation: 448 R Axis:   77 Text Interpretation: Sinus tachycardia no acute ST/T changes similar to 2007 Confirmed by Pricilla Loveless [phone]) on 12/15/2020 12:32:36 PM   Radiology DG Chest 2  View  Result Date: 12/15/2020 CLINICAL DATA:  Sepsis, tachycardia EXAM: CHEST - 2 VIEW COMPARISON:  03/12/2018 FINDINGS: Stable cardiomediastinal contours. Low lung volumes. Minimal streaky left basilar opacity, favor atelectasis. Otherwise, lungs are clear. No pleural effusion. No pneumothorax. Partially visualized cervical  ACDF hardware. IMPRESSION: Low lung volumes with minimal streaky left basilar opacity, favor atelectasis. Electronically Signed   By: Duanne Guess D.O.   On: 12/15/2020 13:02    Procedures Procedures   Medications Ordered in ED Medications  potassium chloride SA (KLOR-CON) CR tablet 40 mEq (has no administration in time range)  cefTRIAXone (ROCEPHIN) 1 g in sodium chloride 0.9 % 100 mL IVPB (has no administration in time range)  azithromycin (ZITHROMAX) 500 mg in sodium chloride 0.9 % 250 mL IVPB (has no administration in time range)  fentaNYL (SUBLIMAZE) injection 100 mcg (has no administration in time range)  acetaminophen (TYLENOL) tablet 650 mg (650 mg Oral Given 12/15/20 1316)  sodium chloride 0.9 % bolus 1,000 mL (0 mLs Intravenous Stopped 12/15/20 1518)    ED Course  I have reviewed the triage vital signs and the nursing notes.  Pertinent labs & imaging results that were available during my care of the patient were reviewed by me and considered in my medical decision making (see chart for details).    MDM Rules/Calculators/A&P                          Patient's covid test and flu tests are negative. She overall doesn't fell well. Pain is right lower chest/flank. It is persistent. I suspect with fever and cough she has occult pneumonia not seen on CXR. However it also could be other abdominal/retroperitoneal emergency such as infected kidney stone. Will get CT abd/pelvis and CT chest. Care transferred to Dr. Rubin Payor.  Final Clinical Impression(s) / ED Diagnoses Final diagnoses:  None    Rx / DC Orders ED Discharge Orders    None       Pricilla Loveless, MD 12/15/20 [phone]

## 2020-12-15 NOTE — ED Notes (Signed)
Before ambulating in the hall pt's O2 was 95%. Pt's O2 ranged from 91-98 during ambulation, mostly staying at 94%. After pt lied down O2 was 92%. Pt stated she felt dizzy and lightheaded during the walk, but did not feel as if she would pass out. Pt stated she had head and back pain that worsen during the walk.

## 2020-12-15 NOTE — Discharge Instructions (Addendum)
Your potassium was also mildly decreased and will need to be followed as an outpatient.

## 2020-12-15 NOTE — ED Provider Notes (Signed)
Emergency Medicine Provider Triage Evaluation Note  Tonya Pope , a 70 y.o. female  was evaluated in triage.  Pt complains of dry cough, headaches, body aches, sore throat, fevers for the past week. Negative at home COVID tests this past week and also negative COVID and flu at [facility] today. Sent here for further eval. Last took Tylenol last night. Is vaccinated. No recent sick contacts.  Review of Systems  Positive: + fevers, chills, body aches, sore throat, cough Negative: - abdominal pain, urinary symptoms.  Physical Exam  BP 122/63 (BP Location: Right Arm)   Pulse (!) 104   Temp (!) 102 F (38.9 C) (Oral)   Resp (!) 22   SpO2 97%  Gen:   Awake, no distress . Ill appearing Resp:  Normal effort. Actively coughing. Rales.  MSK:   Moves extremities without difficulty Other:    Medical Decision Making  Medically screening exam initiated at 12:20 PM.  Appropriate orders placed.  Tabita B Matthis was informed that the remainder of the evaluation will be completed by another provider, this initial triage assessment does not replace that evaluation, and the importance of remaining in the ED until their evaluation is complete.  Meets SEPSIS criteria at this time. Will need further eval in the back. Possible COVID.    Tanda Rockers, PA-C 12/15/20 1222    Jacalyn Lefevre, MD 12/15/20 [phone]

## 2020-12-20 LAB — CULTURE, BLOOD (ROUTINE X 2)
Culture: NO GROWTH
Culture: NO GROWTH

## 2021-02-03 DIAGNOSIS — Z23 Encounter for immunization: Secondary | ICD-10-CM | POA: Diagnosis not present

## 2021-03-10 DIAGNOSIS — R5383 Other fatigue: Secondary | ICD-10-CM | POA: Diagnosis not present

## 2021-03-10 DIAGNOSIS — F320 Major depressive disorder, single episode, mild: Secondary | ICD-10-CM | POA: Diagnosis not present

## 2021-03-10 DIAGNOSIS — F411 Generalized anxiety disorder: Secondary | ICD-10-CM | POA: Diagnosis not present

## 2021-04-20 DIAGNOSIS — Z23 Encounter for immunization: Secondary | ICD-10-CM | POA: Diagnosis not present

## 2021-04-26 DIAGNOSIS — R030 Elevated blood-pressure reading, without diagnosis of hypertension: Secondary | ICD-10-CM | POA: Diagnosis not present

## 2021-04-26 DIAGNOSIS — Z79899 Other long term (current) drug therapy: Secondary | ICD-10-CM | POA: Diagnosis not present

## 2021-04-26 DIAGNOSIS — M5416 Radiculopathy, lumbar region: Secondary | ICD-10-CM | POA: Diagnosis not present

## 2021-04-26 DIAGNOSIS — F329 Major depressive disorder, single episode, unspecified: Secondary | ICD-10-CM | POA: Diagnosis not present

## 2021-04-26 DIAGNOSIS — M461 Sacroiliitis, not elsewhere classified: Secondary | ICD-10-CM | POA: Diagnosis not present

## 2021-04-26 DIAGNOSIS — M47816 Spondylosis without myelopathy or radiculopathy, lumbar region: Secondary | ICD-10-CM | POA: Diagnosis not present

## 2021-04-26 DIAGNOSIS — M48061 Spinal stenosis, lumbar region without neurogenic claudication: Secondary | ICD-10-CM | POA: Diagnosis not present

## 2021-05-19 DIAGNOSIS — H2513 Age-related nuclear cataract, bilateral: Secondary | ICD-10-CM | POA: Diagnosis not present

## 2021-05-19 DIAGNOSIS — H25013 Cortical age-related cataract, bilateral: Secondary | ICD-10-CM | POA: Diagnosis not present

## 2021-05-19 DIAGNOSIS — H401131 Primary open-angle glaucoma, bilateral, mild stage: Secondary | ICD-10-CM | POA: Diagnosis not present

## 2021-05-28 DIAGNOSIS — M461 Sacroiliitis, not elsewhere classified: Secondary | ICD-10-CM | POA: Diagnosis not present

## 2021-06-30 NOTE — Progress Notes (Signed)
Cardiology Office Note:    Date:  07/01/2021   ID:  Tonya Pope, DOB 06/26/1951, MRN [id number]  PCP:  Andria Frames, PA-C  Cardiologist:  Shirlee More, MD    Referring MD: Andria Frames, PA-C    ASSESSMENT:    1. Palpitations   2. Mitral prolapse    PLAN:    In order of problems listed above:  She had a recent flare of symptoms in September unfortunately not captured if she did not have a smart watch at the time I am going to give her a prescription for low-dose metoprolol she can take as needed ask her to capture episodes with the apple watch send them to me through MyChart if frequent and bothersome ongoing beta-blocker therapy be appropriate.  Reinforced with her the need to avoid over-the-counter proarrhythmic drugs Stable on physical exam   Next appointment: 1 year   Medication Adjustments/Labs and Tests Ordered: Current medicines are reviewed at length with the patient today.  Concerns regarding medicines are outlined above.  Orders Placed This Encounter  Procedures   Basic metabolic panel   Magnesium   Meds ordered this encounter  Medications   metoprolol tartrate (LOPRESSOR) 25 MG tablet    Sig: Take 0.5 tablets (12.5 mg total) by mouth daily as needed.    Dispense:  45 tablet    Refill:  3    Complaint follow-up for palpitation History of Present Illness:    Tonya Pope is a 70 y.o. female with a hx of mitral valve prolapse and palpitation worsened with a sedating antihistamine last seen 05/06/2020.  Compliance with diet, lifestyle and medications: Yes   She has completely recovered from her respiratory illness in retrospect she had typical pleurisy. September she had a flare of palpitation subsequently got an apple watch and never caught any of the episodes  I am going to give her a prescription for Lopressor that she can take as needed for bothersome palpitation and encourage her to capture episodes with a smart watch showed her  the difference in appearance of normal PVC and atrial premature contraction I will ask her to send me strips through MyChart when she captures them.  If frequent and bothersome ongoing beta-blocker therapy can be helpful but she has had none in the last few months  She was seen in the ED in May for pleuritic chest pain with cough and sputum production CT of the chest showed no pulmonary embolism and minimal bibasilar subsegmental atelectasis.  Cardiovascular structures were normal.  EKG 12/15/2020 independently reviewed sinus rhythm and normal Past Medical History:  Diagnosis Date   Abdominal pain, epigastric 03/27/2014   Achilles tendinitis of right lower extremity 02/12/2018   Acne    Anemia    Anxiety    Arthritis    Bone spur of right foot 02/16/2018   Formatting of this note might be different from the original. Added automatically from request for surgery [id number]   Carpal tunnel syndrome on left 10/31/2016   Cervical spondylosis without myelopathy 10/31/2016   Colon polyps    Depression    Diarrhea    C. Diff   Difficult intubation    patient stated "had to use a smaller tube" in previous surgery in 80's   GERD 03/28/2007   Qualifier: Diagnosis of  By: Sherren Mocha RN, Dorian Pod     GERD (gastroesophageal reflux disease)    Hematuria 03/28/2007   Qualifier: Diagnosis of  By: Scherrie Gerlach  History of colonic polyps 05/08/2008   Qualifier: Diagnosis of  By: Tawanna Cooler MD, Tinnie Gens A    Hyperlipidemia    Insomnia    Medication reaction 12/26/2015   Migraine    Mitral valve disease 08/03/2009   Formatting of this note might be different from the original. Overview:  Qualifier: Diagnosis of  By: Tawanna Cooler MD, Eugenio Hoes   MITRAL VALVE PROLAPSE 08/03/2009   Qualifier: Diagnosis of  By: Tawanna Cooler MD, Eugenio Hoes    OP (osteoporosis)    Osteoarthritis of right hip 10/31/2013   Osteoporosis 03/28/2007   Qualifier: Diagnosis of  By: Tawanna Cooler, RN, Moreen Fowler of this note might be different from the original.  Overview:  Qualifier: Diagnosis of  By: Tawanna Cooler RN, Alvino Chapel   Panic attacks 02/17/2014   Pneumonia    Primary osteoarthritis of first carpometacarpal joint of left hand 10/31/2016   Spinal stenosis, cervical region 01/13/2017   Trigger index finger of left hand 12/02/2019    Past Surgical History:  Procedure Laterality Date   ABDOMINAL HYSTERECTOMY     ANTERIOR CERVICAL DECOMP/DISCECTOMY FUSION N/A 01/13/2017   Procedure: ACDF - C4-C5 - C5-C6;  Surgeon: Donalee Citrin, MD;  Location[facility] OR;  Service: Neurosurgery;  Laterality: N/A;   CARPAL TUNNEL RELEASE Left 11/15/2016   Procedure: LEFT CARPAL TUNNEL RELEASE;  Surgeon: Cindee Salt, MD;  Location: [facility];  Service: Orthopedics;  Laterality: Left;   CARPAL TUNNEL RELEASE Right 08/18/2016   COLONOSCOPY W/ POLYPECTOMY     DIAGNOSTIC LAPAROSCOPY     for endometriosis   ENDOMETRIAL ABLATION     TONSILLECTOMY     TONSILLECTOMY AND ADENOIDECTOMY      Current Medications: Current Meds  Medication Sig   ALPRAZolam (XANAX) 0.25 MG tablet Take 0.25 mg by mouth daily as needed for anxiety.   conjugated estrogens (PREMARIN) vaginal cream Place 1 Applicatorful vaginally 2 (two) times a week.   HYDROcodone-acetaminophen (NORCO/VICODIN) 5-325 MG tablet Take 1-2 tablets by mouth as needed for moderate pain or severe pain.   latanoprost (XALATAN) 0.005 % ophthalmic solution Place 1 drop into both eyes at bedtime.   loperamide (IMODIUM A-D) 2 MG tablet Take 4 mg by mouth as needed for diarrhea or loose stools.   meloxicam (MOBIC) 7.5 MG tablet Take 7.5 mg by mouth daily.   methocarbamol (ROBAXIN) 500 MG tablet Take 1 tablet by mouth as needed for muscle spasms.   metoprolol tartrate (LOPRESSOR) 25 MG tablet Take 0.5 tablets (12.5 mg total) by mouth daily as needed.   pantoprazole (PROTONIX) 40 MG tablet Take 1 tablet (40 mg total) by mouth 2 (two) times daily. (Patient taking differently: Take 40 mg by mouth daily.)   saccharomyces boulardii  (FLORASTOR) 250 MG capsule Take 1 capsule (250 mg total) by mouth 2 (two) times daily. For 2 weeks   tiZANidine (ZANAFLEX) 4 MG tablet Take 4 mg by mouth at bedtime.   venlafaxine XR (EFFEXOR-XR) 75 MG 24 hr capsule Take 75 mg by mouth daily.   Current Facility-Administered Medications for the 07/01/21 encounter (Office Visit) with Baldo Daub, MD  Medication   0.9 %  sodium chloride infusion     Allergies:   Epinephrine, Promethazine hcl, Codeine phosphate, and Penicillins   Social History   Socioeconomic History   Marital status: Widowed    Spouse name: Not on file   Number of children: Not on file   Years of education: Not on file   Highest education level:  Not on file  Occupational History   Not on file  Tobacco Use   Smoking status: Former   Smokeless tobacco: Never  Vaping Use   Vaping Use: Never used  Substance and Sexual Activity   Alcohol use: Yes    Alcohol/week: 12.0 - 14.0 standard drinks    Types: 12 - 14 Glasses of wine per week    Comment: 2 glasses of wine    Drug use: No   Sexual activity: Not on file  Other Topics Concern   Not on file  Social History Narrative   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    Widowed    No kids   Likes to garden and read.      Social Determinants of Health   Financial Resource Strain: Not on file  Food Insecurity: Not on file  Transportation Needs: Not on file  Physical Activity: Not on file  Stress: Not on file  Social Connections: Not on file     Family History: The patient's family history includes Arthritis in her mother; Breast cancer in her maternal aunt; Colon cancer in her maternal grandfather and paternal grandmother; Depression in her sister; Diabetes in her mother; Glaucoma in her father; Heart disease in her mother; Hyperlipidemia in her father; Hypertension in her mother; Stroke in her mother; Thyroid disease in her sister. ROS:   Please see the history of present illness.     All other systems reviewed and are negative.  EKGs/Labs/Other Studies Reviewed:    The following studies were reviewed today:   Zio monitor: 04/20/2020 Study Highlight A ZIO monitor was performed for 7 days beginning 03/30/2020 to assess palpitation. The cardiac rhythm throughout was sinus with average, minimum of maximum heart rates of 80, 47 and 159 bpm. There were no pauses of 3 seconds or greater and no episodes of second or third-degree AV node block or sinus node exit block. Ventricular ectopy was rare with isolated PVCs Supraventricular ectopy was rare with isolated APCs and one 4 beat run of atrial premature contractions. There is 17 triggered and diary events 12 of which were associated with PVCs or APCs.   Conclusion, although both ventricular and supraventricular ectopy were rare, the majority of the triggered and diary events are symptomatic APCs or PVCs.   Echo 04/13/2020:  1. Left ventricular ejection fraction, by estimation, is 60 to 65%. The  left ventricle has normal function. Left ventricular endocardial border  not optimally defined to evaluate regional wall motion. Left ventricular  diastolic parameters are consistent  with Grade I diastolic dysfunction (impaired relaxation).   2. Right ventricular systolic function is normal. The right ventricular  size is normal. There is normal pulmonary artery systolic pressure.   3. The mitral valve is normal in structure. No evidence of mitral valve  regurgitation. No evidence of mitral stenosis.   4. The aortic valve is tricuspid. Aortic valve regurgitation is mild. No  aortic stenosis is present.   5. The inferior vena cava is normal in size with greater than 50%  respiratory variability, suggesting right atrial pressure of 3 mmHg.  EKG: I reviewed in the range of 30 strips from her apple device all sinus rhythm no arrhythmia  Recent Labs: 12/15/2020: ALT 49; BUN 13; Creatinine, Ser 1.05; Hemoglobin 12.3; Platelets 161;  Potassium 3.3; Sodium 135  Recent Lipid Panel    Component Value Date/Time   CHOL 201 (H) 05/09/2018 1401   TRIG 90.0 05/09/2018 1401  HDL 78.80 05/09/2018 1401   CHOLHDL 3 05/09/2018 1401   VLDL 18.0 05/09/2018 1401   LDLCALC 104 (H) 05/09/2018 1401   LDLDIRECT 82.2 04/13/2012 0846    Physical Exam:    VS:  BP 122/78 (BP Location: Right Arm, Patient Position: Sitting, Cuff Size: Normal)    Pulse 88    Ht 5' 1.5" (1.562 m)    Wt 129 lb 12.8 oz (58.9 kg)    SpO2 97%    BMI 24.13 kg/m     Wt Readings from Last 3 Encounters:  07/01/21 129 lb 12.8 oz (58.9 kg)  05/06/20 131 lb 6.4 oz (59.6 kg)  03/30/20 130 lb 6.4 oz (59.1 kg)     GEN:  Well nourished, well developed in no acute distress HEENT: Normal NECK: No JVD; No carotid bruits LYMPHATICS: No lymphadenopathy CARDIAC: RRR, no murmurs, rubs, gallops RESPIRATORY:  Clear to auscultation without rales, wheezing or rhonchi  ABDOMEN: Soft, non-tender, non-distended MUSCULOSKELETAL:  No edema; No deformity  SKIN: Warm and dry NEUROLOGIC:  Alert and oriented x 3 PSYCHIATRIC:  Normal affect    Signed, Shirlee More, MD  07/01/2021 3:25 PM    [facility]

## 2021-07-01 ENCOUNTER — Ambulatory Visit (INDEPENDENT_AMBULATORY_CARE_PROVIDER_SITE_OTHER): Payer: Medicare Other | Admitting: Cardiology

## 2021-07-01 ENCOUNTER — Other Ambulatory Visit: Payer: Self-pay

## 2021-07-01 ENCOUNTER — Encounter: Payer: Self-pay | Admitting: Cardiology

## 2021-07-01 VITALS — BP 122/78 | HR 88 | Ht 61.5 in | Wt 129.8 lb

## 2021-07-01 DIAGNOSIS — I341 Nonrheumatic mitral (valve) prolapse: Secondary | ICD-10-CM | POA: Diagnosis not present

## 2021-07-01 DIAGNOSIS — R002 Palpitations: Secondary | ICD-10-CM

## 2021-07-01 MED ORDER — METOPROLOL TARTRATE 25 MG PO TABS: 12.5000 mg | ORAL_TABLET | Freq: Every day | ORAL | 3 refills | Status: AC | PRN

## 2021-07-01 NOTE — Patient Instructions (Signed)
Medication Instructions:  Your physician has recommended you make the following change in your medication:  START: Lopressor 12.5 mg take 0.5 tablet by mouth daily as needed.  *If you need a refill on your cardiac medications before your next appointment, please call your pharmacy*   Lab Work: Your physician recommends that you return for lab work in: TODAY BMP, Mag If you have labs (blood work) drawn today and your tests are completely normal, you will receive your results only by: MyChart Message (if you have MyChart) OR A paper copy in the mail If you have any lab test that is abnormal or we need to change your treatment, we will call you to review the results.   Testing/Procedures: None   Follow-Up: At [facility], you and your health needs are our priority.  As part of our continuing mission to provide you with exceptional heart care, we have created designated Provider Care Teams.  These Care Teams include your primary Cardiologist (physician) and Advanced Practice Providers (APPs -  Physician Assistants and Nurse Practitioners) who all work together to provide you with the care you need, when you need it.  We recommend signing up for the patient portal called "MyChart".  Sign up information is provided on this After Visit Summary.  MyChart is used to connect with patients for Virtual Visits (Telemedicine).  Patients are able to view lab/test results, encounter notes, upcoming appointments, etc.  Non-urgent messages can be sent to your provider as well.   To learn more about what you can do with MyChart, go to [email or web address].    Your next appointment:   1 year(s)  The format for your next appointment:   In Person  Provider:   Norman Herrlich, MD    Other Instructions  1. Avoid all over-the-counter antihistamines except Claritin/Loratadine and Zyrtec/Cetrizine. 2. Avoid all combination including cold sinus allergies flu decongestant and sleep medications 3. You  can use Robitussin DM Mucinex and Mucinex DM for cough. 4. can use Tylenol aspirin ibuprofen and naproxen but no combinations such as sleep or sinus.

## 2021-07-02 ENCOUNTER — Telehealth: Payer: Self-pay

## 2021-07-02 LAB — BASIC METABOLIC PANEL
BUN/Creatinine Ratio: 20 (ref 12–28)
BUN: 18 mg/dL (ref 8–27)
CO2: 24 mmol/L (ref 20–29)
Calcium: 9.7 mg/dL (ref 8.7–10.3)
Chloride: 102 mmol/L (ref 96–106)
Creatinine, Ser: 0.88 mg/dL (ref 0.57–1.00)
Glucose: 93 mg/dL (ref 70–99)
Potassium: 4.2 mmol/L (ref 3.5–5.2)
Sodium: 141 mmol/L (ref 134–144)
eGFR: 70 mL/min/{1.73_m2} (ref >59)

## 2021-07-02 LAB — MAGNESIUM: Magnesium: 1.9 mg/dL (ref 1.6–2.3)

## 2021-07-02 NOTE — Telephone Encounter (Signed)
Left message on patients voicemail to please return our call.   

## 2021-07-02 NOTE — Telephone Encounter (Signed)
-----   Message from Baldo Daub, MD sent at 07/02/2021  6:59 AM EST ----- K is low, 20 meq daily Recheck 6 weeks likely needs long term replacement

## 2021-07-05 ENCOUNTER — Telehealth: Payer: Self-pay

## 2021-07-05 NOTE — Telephone Encounter (Signed)
Left message on patients voicemail to please return our call.   

## 2021-07-05 NOTE — Telephone Encounter (Signed)
-----   Message from Baldo Daub, MD sent at 07/02/2021  6:59 AM EST ----- K is low, 20 meq daily Recheck 6 weeks likely needs long term replacement

## 2021-07-06 ENCOUNTER — Telehealth: Payer: Self-pay

## 2021-07-06 DIAGNOSIS — E876 Hypokalemia: Secondary | ICD-10-CM

## 2021-07-06 MED ORDER — POTASSIUM CHLORIDE CRYS ER 20 MEQ PO TBCR: 20.0000 meq | EXTENDED_RELEASE_TABLET | Freq: Every day | ORAL | 3 refills | Status: DC

## 2021-07-06 NOTE — Telephone Encounter (Signed)
Spoke with patient regarding results and recommendation.  Patient verbalizes understanding and is agreeable to plan of care. Advised patient to call back with any issues or concerns.  

## 2021-07-06 NOTE — Telephone Encounter (Signed)
-----   Message from Baldo Daub, MD sent at 07/02/2021  6:59 AM EST ----- K is low, 20 meq daily Recheck 6 weeks likely needs long term replacement

## 2021-07-08 ENCOUNTER — Encounter: Payer: Self-pay | Admitting: Cardiology

## 2021-07-09 MED ORDER — POTASSIUM CHLORIDE CRYS ER 20 MEQ PO TBCR: 20.0000 meq | EXTENDED_RELEASE_TABLET | Freq: Every day | ORAL | 3 refills | Status: DC

## 2021-08-20 ENCOUNTER — Encounter: Payer: Self-pay | Admitting: Cardiology

## 2021-08-20 ENCOUNTER — Other Ambulatory Visit: Payer: Self-pay

## 2021-08-20 DIAGNOSIS — E876 Hypokalemia: Secondary | ICD-10-CM

## 2021-09-15 ENCOUNTER — Encounter: Payer: Self-pay | Admitting: Cardiology

## 2022-07-22 ENCOUNTER — Other Ambulatory Visit: Payer: Self-pay

## 2022-07-22 ENCOUNTER — Emergency Department (HOSPITAL_BASED_OUTPATIENT_CLINIC_OR_DEPARTMENT_OTHER): Payer: Medicare Other

## 2022-07-22 ENCOUNTER — Emergency Department (HOSPITAL_BASED_OUTPATIENT_CLINIC_OR_DEPARTMENT_OTHER)
Admission: EM | Admit: 2022-07-22 | Discharge: 2022-07-22 | Disposition: A | Discharge status: Home / Self Care | Payer: Medicare Other | Attending: Emergency Medicine | Admitting: Emergency Medicine

## 2022-07-22 ENCOUNTER — Encounter (HOSPITAL_BASED_OUTPATIENT_CLINIC_OR_DEPARTMENT_OTHER): Payer: Self-pay | Admitting: Urology

## 2022-07-22 DIAGNOSIS — M545 Low back pain, unspecified: Secondary | ICD-10-CM | POA: Diagnosis not present

## 2022-07-22 DIAGNOSIS — M546 Pain in thoracic spine: Secondary | ICD-10-CM | POA: Diagnosis not present

## 2022-07-22 MED ORDER — METHYLPREDNISOLONE 4 MG PO TBPK: ORAL_TABLET | ORAL | 0 refills | Status: DC

## 2022-07-22 MED ORDER — CYCLOBENZAPRINE HCL 10 MG PO TABS: 10.0000 mg | ORAL_TABLET | Freq: Two times a day (BID) | ORAL | 0 refills | Status: DC | PRN

## 2022-07-22 MED ORDER — OXYCODONE HCL 5 MG PO TABS: 5.0000 mg | ORAL_TABLET | Freq: Four times a day (QID) | ORAL | 0 refills | Status: DC | PRN

## 2022-07-22 NOTE — ED Provider Notes (Signed)
[place] EMERGENCY DEPARTMENT Provider Note   CSN: [id number] Arrival date & time: 07/22/22  1612     History  Chief Complaint  Patient presents with   Back Pain    Tonya Pope is a 72 y.o. female.  Patient here with acute on chronic back pain.  She is having back pain mostly in the lumbar spine on and off for the last few weeks.  She takes muscle relaxants and lidocaine patches.  Denies any new trauma.  No problems going to the bathroom.  No loss of bowel or bladder.  Having some upper back tightness at times as well.  She denies any weakness or numbness.  No headaches or speech changes.  No chest pain or shortness of breath.  Nothing makes it better.  Movement makes it worse.  Ultimately her pain has improved throughout today with some tenacity and otherwise.  The history is provided by the patient.       Home Medications Prior to Admission medications   Medication Sig Start Date End Date Taking? Authorizing Provider  ALPRAZolam Duanne Moron) 0.25 MG tablet Take 0.25 mg by mouth daily as needed for anxiety. 10/02/20   [provider]  Calcium Carb-Cholecalciferol (CALCIUM/VITAMIN D) 600-400 MG-UNIT TABS Take 1 tablet by mouth 2 (two) times daily.    [provider]  cetirizine (ZYRTEC) 10 MG tablet Take 10 mg by mouth daily.    [provider]  conjugated estrogens (PREMARIN) vaginal cream Place 1 Applicatorful vaginally 2 (two) times a week. 05/10/18   Nafziger, Tommi Rumps, NP  cyclobenzaprine (FLEXERIL) 10 MG tablet Take 1 tablet (10 mg total) by mouth 2 (two) times daily as needed for muscle spasms. 07/22/22  Yes Ramzey Petrovic, DO  doxycycline (VIBRAMYCIN) 100 MG capsule Take 1 capsule (100 mg total) by mouth 2 (two) times daily. 12/15/20   Davonna Belling, MD  HYDROcodone-acetaminophen (NORCO/VICODIN) 5-325 MG tablet Take 1-2 tablets by mouth as needed for moderate pain or severe pain. 06/11/18   [provider]  latanoprost (XALATAN)  0.005 % ophthalmic solution Place 1 drop into both eyes at bedtime. 02/05/20   [provider]  loperamide (IMODIUM A-D) 2 MG tablet Take 4 mg by mouth as needed for diarrhea or loose stools.    [provider]  meloxicam (MOBIC) 7.5 MG tablet Take 7.5 mg by mouth daily.    [provider]  methocarbamol (ROBAXIN) 500 MG tablet Take 1 tablet by mouth as needed for muscle spasms. 09/12/19   [provider]  methylPREDNISolone (MEDROL DOSEPAK) 4 MG TBPK tablet Follow package insert 07/22/22  Yes Jay Haskew, DO  metoprolol tartrate (LOPRESSOR) 25 MG tablet Take 0.5 tablets (12.5 mg total) by mouth daily as needed. 07/01/21 09/29/21  Richardo Priest, MD  ondansetron (ZOFRAN-ODT) 4 MG disintegrating tablet Take 1 tablet (4 mg total) by mouth every 8 (eight) hours as needed for nausea or vomiting. 12/15/20   Davonna Belling, MD  oxyCODONE (ROXICODONE) 5 MG immediate release tablet Take 1 tablet (5 mg total) by mouth every 6 (six) hours as needed for up to 5 doses for severe pain or breakthrough pain. 07/22/22  Yes Kryssa Risenhoover, DO  pantoprazole (PROTONIX) 40 MG tablet Take 1 tablet (40 mg total) by mouth 2 (two) times daily. Patient taking differently: Take 40 mg by mouth daily. 05/09/18 07/01/21  Nafziger, Tommi Rumps, NP  potassium chloride SA (KLOR-CON M) 20 MEQ tablet Take 1 tablet (20 mEq total) by mouth daily. 07/09/21  Richardo Priest, MD  saccharomyces boulardii (FLORASTOR) 250 MG capsule Take 1 capsule (250 mg total) by mouth 2 (two) times daily. For 2 weeks 04/24/17   Irene Shipper, MD  tiZANidine (ZANAFLEX) 4 MG tablet Take 4 mg by mouth at bedtime. 03/28/20   [provider]  venlafaxine XR (EFFEXOR-XR) 75 MG 24 hr capsule Take 75 mg by mouth daily. 03/06/20   [provider]      Allergies    Epinephrine, Promethazine hcl, Codeine phosphate, and Penicillins    Review of Systems   Review of Systems  Physical Exam Updated Vital Signs BP 120/80    Pulse 69   Temp 98.2 F (36.8 C) (Oral)   Resp 15   Ht 5\' 2"  (1.575 m)   Wt 58.9 kg   SpO2 100%   BMI 23.75 kg/m  Physical Exam Vitals and nursing note reviewed.  Constitutional:      General: She is not in acute distress.    Appearance: She is well-developed.  HENT:     Head: Normocephalic and atraumatic.     Nose: Nose normal.     Mouth/Throat:     Mouth: Mucous membranes are moist.  Eyes:     Extraocular Movements: Extraocular movements intact.     Conjunctiva/sclera: Conjunctivae normal.     Pupils: Pupils are equal, round, and reactive to light.  Cardiovascular:     Rate and Rhythm: Normal rate and regular rhythm.     Pulses: Normal pulses.     Heart sounds: Normal heart sounds. No murmur heard. Pulmonary:     Effort: Pulmonary effort is normal. No respiratory distress.     Breath sounds: Normal breath sounds.  Abdominal:     Palpations: Abdomen is soft.     Tenderness: There is no abdominal tenderness.  Musculoskeletal:        General: Tenderness present. No swelling.     Cervical back: Normal range of motion and neck supple.     Comments: Tenderness to the paraspinal lumbar muscles bilaterally  Skin:    General: Skin is warm and dry.     Capillary Refill: Capillary refill takes less than 2 seconds.  Neurological:     General: No focal deficit present.     Mental Status: She is alert and oriented to person, place, and time.     Cranial Nerves: No cranial nerve deficit.     Sensory: No sensory deficit.     Motor: No weakness.     Coordination: Coordination normal.  Psychiatric:        Mood and Affect: Mood normal.     ED Results / Procedures / Treatments   Labs (all labs ordered are listed, but only abnormal results are displayed) Labs Reviewed - No data to display  EKG EKG Interpretation  Date/Time:  Friday July 22 2022 17:22:58 EST Ventricular Rate:  75 PR Interval:  136 QRS Duration: 76 QT Interval:  368 QTC Calculation: 410 R  Axis:   50 Text Interpretation: Normal sinus rhythm Confirmed by Lennice Sites ([id number]) on 07/22/2022 5:27:07 PM  Radiology DG Lumbar Spine Complete  Result Date: 07/22/2022 CLINICAL DATA:  Low back pain EXAM: LUMBAR SPINE - COMPLETE 4+ VIEW COMPARISON:  None Available. FINDINGS: No recent fracture is seen. Degenerative changes are noted with bony spurs, disc space narrowing and facet hypertrophy at multiple levels, most severe at L4-L5 and L5-S1 levels. Paraspinal soft tissues are unremarkable. Degenerative changes are noted in right hip. IMPRESSION: No  recent fracture is seen in lumbar spine. Lumbar spondylosis, most severe at L4-L5 and L5-S1 levels. Degenerative changes are noted in right hip with joint space narrowing and bony spurs. Electronically Signed   By: Elmer Picker M.D.   On: 07/22/2022 17:25   DG Thoracic Spine 2 View  Result Date: 07/22/2022 CLINICAL DATA:  complains of neck and lower back pain which began 3 weeks ago EXAM: THORACIC SPINE 2 VIEWS COMPARISON:  CT angio chest 12/15/2020 FINDINGS: Mild curvature of the thoracic spine appears convex towards the right. The vertebral body heights are well preserved. There is multilevel disc space narrowing and endplate spurring noted. No acute fracture. Signs of previous ACDF. IMPRESSION: 1. No acute findings. 2. Mild scoliosis and degenerative disc disease. Electronically Signed   By: Kerby Moors M.D.   On: 07/22/2022 17:24    Procedures Procedures    Medications Ordered in ED Medications - No data to display  ED Course/ Medical Decision Making/ A&P                           Medical Decision Making Amount and/or Complexity of Data Reviewed Radiology: ordered.  Risk Prescription drug management.   Tonya Pope is here with back pain.  History of osteoporosis, anxiety.  Patient tender in the paraspinal lumbar muscles bilaterally with increased tone.  Differential is likely muscle spasm.  X-rays were obtained that showed  no acute fracture or malalignment.  She does not have any symptoms of cauda equina.  No chest pain or shortness of breath.  No weakness or numbness.  She is neurovascular neuromuscular intact.  She is uses muscle relaxants including tenacity and lidocaine patches already.  Will put her on Medrol Dosepak, Flexeril and use of oxycodone for breakthrough pain.  Discharged in good condition.  She understands return precautions.  This chart was dictated using voice recognition software.  Despite best efforts to proofread,  errors can occur which can change the documentation meaning.         Final Clinical Impression(s) / ED Diagnoses Final diagnoses:  Acute low back pain without sciatica, unspecified back pain laterality    Rx / DC Orders ED Discharge Orders          Ordered    oxyCODONE (ROXICODONE) 5 MG immediate release tablet  Every 6 hours PRN        07/22/22 2057    methylPREDNISolone (MEDROL DOSEPAK) 4 MG TBPK tablet        07/22/22 2057    cyclobenzaprine (FLEXERIL) 10 MG tablet  2 times daily PRN        07/22/22 2057              Lennice Sites, DO 07/22/22 2100

## 2022-07-22 NOTE — ED Triage Notes (Signed)
[facility] sent pt for back pain  Pt states muscle spasm in shoulder and neck 3 weeks  Started having mid thoracic back pain that started last night   H/o neck surgery

## 2022-09-01 ENCOUNTER — Ambulatory Visit: Payer: Medicare Other | Admitting: Cardiology

## 2023-02-17 ENCOUNTER — Other Ambulatory Visit: Payer: Self-pay | Admitting: Student

## 2023-02-17 ENCOUNTER — Ambulatory Visit
Admission: RE | Admit: 2023-02-17 | Discharge: 2023-02-17 | Disposition: A | Discharge status: Home / Self Care | Payer: Medicare Other | Source: Ambulatory Visit | Attending: Student | Admitting: Student

## 2023-02-17 DIAGNOSIS — M25551 Pain in right hip: Secondary | ICD-10-CM

## 2023-05-08 ENCOUNTER — Encounter (HOSPITAL_BASED_OUTPATIENT_CLINIC_OR_DEPARTMENT_OTHER): Payer: Self-pay | Admitting: *Deleted

## 2023-05-08 ENCOUNTER — Emergency Department (HOSPITAL_BASED_OUTPATIENT_CLINIC_OR_DEPARTMENT_OTHER): Payer: Medicare Other

## 2023-05-08 ENCOUNTER — Other Ambulatory Visit: Payer: Self-pay

## 2023-05-08 ENCOUNTER — Inpatient Hospital Stay (HOSPITAL_BASED_OUTPATIENT_CLINIC_OR_DEPARTMENT_OTHER)
Admission: EM | Admit: 2023-05-08 | Discharge: 2023-05-12 | DRG: 398 | Disposition: A | Discharge status: Home / Self Care | Payer: Medicare Other | Attending: General Surgery | Admitting: General Surgery

## 2023-05-08 ENCOUNTER — Emergency Department (HOSPITAL_BASED_OUTPATIENT_CLINIC_OR_DEPARTMENT_OTHER): Payer: Medicare Other | Admitting: Radiology

## 2023-05-08 DIAGNOSIS — Z981 Arthrodesis status: Secondary | ICD-10-CM

## 2023-05-08 DIAGNOSIS — K3533 Acute appendicitis with perforation and localized peritonitis, with abscess: Secondary | ICD-10-CM | POA: Diagnosis not present

## 2023-05-08 DIAGNOSIS — Z87891 Personal history of nicotine dependence: Secondary | ICD-10-CM

## 2023-05-08 DIAGNOSIS — Z79899 Other long term (current) drug therapy: Secondary | ICD-10-CM

## 2023-05-08 DIAGNOSIS — M810 Age-related osteoporosis without current pathological fracture: Secondary | ICD-10-CM | POA: Diagnosis present

## 2023-05-08 DIAGNOSIS — Z90710 Acquired absence of both cervix and uterus: Secondary | ICD-10-CM

## 2023-05-08 DIAGNOSIS — Z888 Allergy status to other drugs, medicaments and biological substances status: Secondary | ICD-10-CM

## 2023-05-08 DIAGNOSIS — K567 Ileus, unspecified: Secondary | ICD-10-CM | POA: Diagnosis not present

## 2023-05-08 DIAGNOSIS — E785 Hyperlipidemia, unspecified: Secondary | ICD-10-CM | POA: Diagnosis present

## 2023-05-08 DIAGNOSIS — Z9089 Acquired absence of other organs: Secondary | ICD-10-CM

## 2023-05-08 DIAGNOSIS — Z83511 Family history of glaucoma: Secondary | ICD-10-CM

## 2023-05-08 DIAGNOSIS — Z800 Family history of malignant neoplasm of digestive organs: Secondary | ICD-10-CM

## 2023-05-08 DIAGNOSIS — Z8261 Family history of arthritis: Secondary | ICD-10-CM

## 2023-05-08 DIAGNOSIS — Z833 Family history of diabetes mellitus: Secondary | ICD-10-CM

## 2023-05-08 DIAGNOSIS — Z83438 Family history of other disorder of lipoprotein metabolism and other lipidemia: Secondary | ICD-10-CM

## 2023-05-08 DIAGNOSIS — G43909 Migraine, unspecified, not intractable, without status migrainosus: Secondary | ICD-10-CM | POA: Diagnosis present

## 2023-05-08 DIAGNOSIS — Z8349 Family history of other endocrine, nutritional and metabolic diseases: Secondary | ICD-10-CM

## 2023-05-08 DIAGNOSIS — Z8619 Personal history of other infectious and parasitic diseases: Secondary | ICD-10-CM

## 2023-05-08 DIAGNOSIS — M1611 Unilateral primary osteoarthritis, right hip: Secondary | ICD-10-CM | POA: Diagnosis present

## 2023-05-08 DIAGNOSIS — F419 Anxiety disorder, unspecified: Secondary | ICD-10-CM | POA: Diagnosis present

## 2023-05-08 DIAGNOSIS — Z8249 Family history of ischemic heart disease and other diseases of the circulatory system: Secondary | ICD-10-CM

## 2023-05-08 DIAGNOSIS — Z8701 Personal history of pneumonia (recurrent): Secondary | ICD-10-CM

## 2023-05-08 DIAGNOSIS — Z823 Family history of stroke: Secondary | ICD-10-CM

## 2023-05-08 DIAGNOSIS — Z818 Family history of other mental and behavioral disorders: Secondary | ICD-10-CM

## 2023-05-08 DIAGNOSIS — K3580 Unspecified acute appendicitis: Principal | ICD-10-CM | POA: Diagnosis present

## 2023-05-08 DIAGNOSIS — Z860100 Personal history of colon polyps, unspecified: Secondary | ICD-10-CM

## 2023-05-08 DIAGNOSIS — Z791 Long term (current) use of non-steroidal anti-inflammatories (NSAID): Secondary | ICD-10-CM

## 2023-05-08 DIAGNOSIS — Z8719 Personal history of other diseases of the digestive system: Secondary | ICD-10-CM

## 2023-05-08 DIAGNOSIS — F32A Depression, unspecified: Secondary | ICD-10-CM | POA: Diagnosis present

## 2023-05-08 DIAGNOSIS — K9189 Other postprocedural complications and disorders of digestive system: Secondary | ICD-10-CM | POA: Diagnosis not present

## 2023-05-08 DIAGNOSIS — G4700 Insomnia, unspecified: Secondary | ICD-10-CM | POA: Diagnosis present

## 2023-05-08 DIAGNOSIS — Z803 Family history of malignant neoplasm of breast: Secondary | ICD-10-CM

## 2023-05-08 DIAGNOSIS — K219 Gastro-esophageal reflux disease without esophagitis: Secondary | ICD-10-CM | POA: Diagnosis present

## 2023-05-08 DIAGNOSIS — Z8679 Personal history of other diseases of the circulatory system: Secondary | ICD-10-CM

## 2023-05-08 DIAGNOSIS — Z862 Personal history of diseases of the blood and blood-forming organs and certain disorders involving the immune mechanism: Secondary | ICD-10-CM

## 2023-05-08 DIAGNOSIS — I341 Nonrheumatic mitral (valve) prolapse: Secondary | ICD-10-CM | POA: Diagnosis present

## 2023-05-08 DIAGNOSIS — Z9889 Other specified postprocedural states: Secondary | ICD-10-CM

## 2023-05-08 DIAGNOSIS — Z880 Allergy status to penicillin: Secondary | ICD-10-CM

## 2023-05-08 DIAGNOSIS — Z885 Allergy status to narcotic agent status: Secondary | ICD-10-CM

## 2023-05-08 LAB — CBC WITH DIFFERENTIAL/PLATELET
Abs Immature Granulocytes: 0.04 10*3/uL (ref 0.00–0.07)
Basophils Absolute: 0 10*3/uL (ref 0.0–0.1)
Basophils Relative: 0 %
Eosinophils Absolute: 0 10*3/uL (ref 0.0–0.5)
Eosinophils Relative: 0 %
HCT: 35.3 % — ABNORMAL LOW (ref 36.0–46.0)
Hemoglobin: 12.2 g/dL (ref 12.0–15.0)
Immature Granulocytes: 0 %
Lymphocytes Relative: 10 %
Lymphs Abs: 1 10*3/uL (ref 0.7–4.0)
MCH: 32.4 pg (ref 26.0–34.0)
MCHC: 34.6 g/dL (ref 30.0–36.0)
MCV: 93.6 fL (ref 80.0–100.0)
Monocytes Absolute: 0.6 10*3/uL (ref 0.1–1.0)
Monocytes Relative: 6 %
Neutro Abs: 8.8 10*3/uL — ABNORMAL HIGH (ref 1.7–7.7)
Neutrophils Relative %: 84 %
Platelets: 218 10*3/uL (ref 150–400)
RBC: 3.77 MIL/uL — ABNORMAL LOW (ref 3.87–5.11)
RDW: 13.5 % (ref 11.5–15.5)
WBC: 10.6 10*3/uL — ABNORMAL HIGH (ref 4.0–10.5)
nRBC: 0 % (ref 0.0–0.2)

## 2023-05-08 LAB — URINALYSIS, W/ REFLEX TO CULTURE (INFECTION SUSPECTED)
Bacteria, UA: NONE SEEN
Bilirubin Urine: NEGATIVE
Glucose, UA: NEGATIVE mg/dL
Hgb urine dipstick: NEGATIVE
Ketones, ur: NEGATIVE mg/dL
Leukocytes,Ua: NEGATIVE
Nitrite: NEGATIVE
Protein, ur: NEGATIVE mg/dL
Specific Gravity, Urine: 1.013 (ref 1.005–1.030)
pH: 7 (ref 5.0–8.0)

## 2023-05-08 LAB — COMPREHENSIVE METABOLIC PANEL
ALT: 34 U/L (ref 0–44)
AST: 29 U/L (ref 15–41)
Albumin: 4.6 g/dL (ref 3.5–5.0)
Alkaline Phosphatase: 72 U/L (ref 38–126)
Anion gap: 7 (ref 5–15)
BUN: 14 mg/dL (ref 8–23)
CO2: 26 mmol/L (ref 22–32)
Calcium: 10 mg/dL (ref 8.9–10.3)
Chloride: 103 mmol/L (ref 98–111)
Creatinine, Ser: 0.94 mg/dL (ref 0.44–1.00)
GFR, Estimated: >60 mL/min (ref >60)
Glucose, Bld: 127 mg/dL — ABNORMAL HIGH (ref 70–99)
Potassium: 4 mmol/L (ref 3.5–5.1)
Sodium: 136 mmol/L (ref 135–145)
Total Bilirubin: 1 mg/dL (ref 0.3–1.2)
Total Protein: 7.2 g/dL (ref 6.5–8.1)

## 2023-05-08 LAB — LACTIC ACID, PLASMA: Lactic Acid, Venous: 1.3 mmol/L (ref 0.5–1.9)

## 2023-05-08 MED ORDER — FENTANYL CITRATE PF 50 MCG/ML IJ SOSY
50.0000 ug | PREFILLED_SYRINGE | Freq: Once | INTRAMUSCULAR | Status: AC
  Administered 2023-05-08: 50 ug via INTRAVENOUS
  Filled 2023-05-08: qty 1

## 2023-05-08 MED ORDER — LACTATED RINGERS IV BOLUS
1000.0000 mL | Freq: Once | INTRAVENOUS | Status: AC
  Administered 2023-05-08: 1000 mL via INTRAVENOUS

## 2023-05-08 MED ORDER — IOHEXOL 300 MG/ML  SOLN
100.0000 mL | Freq: Once | INTRAMUSCULAR | Status: AC | PRN
  Administered 2023-05-08: 100 mL via INTRAVENOUS

## 2023-05-08 NOTE — ED Triage Notes (Signed)
Pt is here for abdominal pain which began as generalized abdominal pain yesterday but now pain is mostly in her lower abdomen.  No GU symptoms, LBM was today.  Pt reports mild nausea with this.

## 2023-05-08 NOTE — ED Notes (Signed)
Lab called to run previous UA sample.

## 2023-05-08 NOTE — ED Provider Notes (Signed)
[place] EMERGENCY DEPARTMENT AT [facility] Provider Note  CSN: [id number] Arrival date & time: 05/08/23 1957  Chief Complaint(s) Abdominal Pain  HPI Tonya Pope is a 72 y.o. female here today for lower abdominal pain that began yesterday.  Patient endorses some pain that started generalized her abdomen, but over the last day has become more suprapubic in nature.  She has felt nauseated, has not vomited, has not been having diarrhea.   Past Medical History Past Medical History:  Diagnosis Date   Abdominal pain, epigastric 03/27/2014   Achilles tendinitis of right lower extremity 02/12/2018   Acne    Anemia    Anxiety    Arthritis    Bone spur of right foot 02/16/2018   Formatting of this note might be different from the original. Added automatically from request for surgery [id number]   Carpal tunnel syndrome on left 10/31/2016   Cervical spondylosis without myelopathy 10/31/2016   Colon polyps    Depression    Diarrhea    C. Diff   Difficult intubation    patient stated "had to use a smaller tube" in previous surgery in 80's   GERD 03/28/2007   Qualifier: Diagnosis of  By: Tawanna Cooler RN, Alvino Chapel     GERD (gastroesophageal reflux disease)    Hematuria 03/28/2007   Qualifier: Diagnosis of  By: Everett Graff     History of colonic polyps 05/08/2008   Qualifier: Diagnosis of  By: Tawanna Cooler MD, Tinnie Gens A    Hyperlipidemia    Insomnia    Medication reaction 12/26/2015   Migraine    Mitral valve disease 08/03/2009   Formatting of this note might be different from the original. Overview:  Qualifier: Diagnosis of  By: Tawanna Cooler MD, Tinnie Gens A   MITRAL VALVE PROLAPSE 08/03/2009   Qualifier: Diagnosis of  By: Tawanna Cooler MD, Eugenio Hoes    OP (osteoporosis)    Osteoarthritis of right hip 10/31/2013   Osteoporosis 03/28/2007   Qualifier: Diagnosis of  By: Tawanna Cooler, RN, Moreen Fowler of this note might be different from the original. Overview:  Qualifier: Diagnosis of  By: Tawanna Cooler, RN, Alvino Chapel   Panic  attacks 02/17/2014   Pneumonia    Primary osteoarthritis of first carpometacarpal joint of left hand 10/31/2016   Spinal stenosis, cervical region 01/13/2017   Trigger index finger of left hand 12/02/2019   Patient Active Problem List   Diagnosis Date Noted   Pneumonia    OP (osteoporosis)    Hyperlipidemia    GERD (gastroesophageal reflux disease)    Difficult intubation    Diarrhea    Colon polyps    Arthritis    Anxiety    Anemia    Acne    Trigger index finger of left hand 12/02/2019   Bone spur of right foot 02/16/2018   Achilles tendinitis of right lower extremity 02/12/2018   Spinal stenosis, cervical region 01/13/2017   Carpal tunnel syndrome on left 10/31/2016   Cervical spondylosis without myelopathy 10/31/2016   Primary osteoarthritis of first carpometacarpal joint of left hand 10/31/2016   Medication reaction 12/26/2015   Abdominal pain, epigastric 03/27/2014   Panic attacks 02/17/2014   Osteoarthritis of right hip 10/31/2013   Mitral valve disease 08/03/2009   Mitral valve disease 08/03/2009   History of colonic polyps 05/08/2008   Depression 04/17/2008   GERD 03/28/2007   Hematuria 03/28/2007   Osteoporosis 03/28/2007   Insomnia 03/28/2007   Home Medication(s) Prior to Admission medications   Medication  Sig Start Date End Date Taking? Authorizing Provider  ALPRAZolam Prudy Feeler) 0.25 MG tablet Take 0.25 mg by mouth daily as needed for anxiety. 10/02/20   [provider]  Calcium Carb-Cholecalciferol (CALCIUM/VITAMIN D) 600-400 MG-UNIT TABS Take 1 tablet by mouth 2 (two) times daily.    [provider]  cetirizine (ZYRTEC) 10 MG tablet Take 10 mg by mouth daily.    [provider]  conjugated estrogens (PREMARIN) vaginal cream Place 1 Applicatorful vaginally 2 (two) times a week. 05/10/18   Nafziger, Kandee Keen, NP  cyclobenzaprine (FLEXERIL) 10 MG tablet Take 1 tablet (10 mg total) by mouth 2 (two) times daily as needed for muscle spasms. 07/22/22    Curatolo, Adam, DO  doxycycline (VIBRAMYCIN) 100 MG capsule Take 1 capsule (100 mg total) by mouth 2 (two) times daily. 12/15/20   Benjiman Core, MD  HYDROcodone-acetaminophen (NORCO/VICODIN) 5-325 MG tablet Take 1-2 tablets by mouth as needed for moderate pain or severe pain. 06/11/18   [provider]  latanoprost (XALATAN) 0.005 % ophthalmic solution Place 1 drop into both eyes at bedtime. 02/05/20   [provider]  loperamide (IMODIUM A-D) 2 MG tablet Take 4 mg by mouth as needed for diarrhea or loose stools.    [provider]  meloxicam (MOBIC) 7.5 MG tablet Take 7.5 mg by mouth daily.    [provider]  methocarbamol (ROBAXIN) 500 MG tablet Take 1 tablet by mouth as needed for muscle spasms. 09/12/19   [provider]  methylPREDNISolone (MEDROL DOSEPAK) 4 MG TBPK tablet Follow package insert 07/22/22   Curatolo, Adam, DO  metoprolol tartrate (LOPRESSOR) 25 MG tablet Take 0.5 tablets (12.5 mg total) by mouth daily as needed. 07/01/21 09/29/21  Baldo Daub, MD  ondansetron (ZOFRAN-ODT) 4 MG disintegrating tablet Take 1 tablet (4 mg total) by mouth every 8 (eight) hours as needed for nausea or vomiting. 12/15/20   Benjiman Core, MD  oxyCODONE (ROXICODONE) 5 MG immediate release tablet Take 1 tablet (5 mg total) by mouth every 6 (six) hours as needed for up to 5 doses for severe pain or breakthrough pain. 07/22/22   Curatolo, Adam, DO  pantoprazole (PROTONIX) 40 MG tablet Take 1 tablet (40 mg total) by mouth 2 (two) times daily. Patient taking differently: Take 40 mg by mouth daily. 05/09/18 07/01/21  Nafziger, Kandee Keen, NP  potassium chloride SA (KLOR-CON M) 20 MEQ tablet Take 1 tablet (20 mEq total) by mouth daily. 07/09/21   Baldo Daub, MD  saccharomyces boulardii (FLORASTOR) 250 MG capsule Take 1 capsule (250 mg total) by mouth 2 (two) times daily. For 2 weeks 04/24/17   Hilarie Fredrickson, MD  tiZANidine (ZANAFLEX) 4 MG tablet Take 4 mg by mouth  at bedtime. 03/28/20   [provider]  venlafaxine XR (EFFEXOR-XR) 75 MG 24 hr capsule Take 75 mg by mouth daily. 03/06/20   [provider]  Past Surgical History Past Surgical History:  Procedure Laterality Date   ABDOMINAL HYSTERECTOMY     ANTERIOR CERVICAL DECOMP/DISCECTOMY FUSION N/A 01/13/2017   Procedure: ACDF - C4-C5 - C5-C6;  Surgeon: Donalee Citrin, MD;  Location: [facility] OR;  Service: Neurosurgery;  Laterality: N/A;   CARPAL TUNNEL RELEASE Left 11/15/2016   Procedure: LEFT CARPAL TUNNEL RELEASE;  Surgeon: Cindee Salt, MD;  Location: [facility];  Service: Orthopedics;  Laterality: Left;   CARPAL TUNNEL RELEASE Right 08/18/2016   COLONOSCOPY W/ POLYPECTOMY     DIAGNOSTIC LAPAROSCOPY     for endometriosis   ENDOMETRIAL ABLATION     TONSILLECTOMY     TONSILLECTOMY AND ADENOIDECTOMY     Family History Family History  Problem Relation Age of Onset   Stroke Mother    Arthritis Mother    Diabetes Mother    Heart disease Mother    Hypertension Mother    Hyperlipidemia Father    Glaucoma Father    Thyroid disease Sister    Breast cancer Maternal Aunt    Colon cancer Maternal Grandfather    Depression Sister    Colon cancer Paternal Grandmother     Social History Social History   Tobacco Use   Smoking status: Former   Smokeless tobacco: Never  Vaping Use   Vaping status: Never Used  Substance Use Topics   Alcohol use: Yes    Alcohol/week: 12.0 - 14.0 standard drinks of alcohol    Types: 12 - 14 Glasses of wine per week    Comment: 2 glasses of wine    Drug use: No   Allergies Epinephrine, Promethazine hcl, Codeine phosphate, and Penicillins  Review of Systems Review of Systems  Physical Exam Vital Signs  I have reviewed the triage vital signs BP (!) 109/52   Pulse 82   Temp 99.2 F (37.3 C) (Oral)   Resp  (!) 21   SpO2 98%   Physical Exam Vitals reviewed.  Pulmonary:     Effort: Pulmonary effort is normal.  Abdominal:     General: Abdomen is flat.     Palpations: Abdomen is soft.     Tenderness: There is abdominal tenderness in the suprapubic area.  Skin:    General: Skin is warm and dry.  Neurological:     Mental Status: She is alert.     ED Results and Treatments Labs (all labs ordered are listed, but only abnormal results are displayed) Labs Reviewed  COMPREHENSIVE METABOLIC PANEL - Abnormal; Notable for the following components:      Result Value   Glucose, Bld 127 (*)    All other components within normal limits  CBC WITH DIFFERENTIAL/PLATELET - Abnormal; Notable for the following components:   WBC 10.6 (*)    RBC 3.77 (*)    HCT 35.3 (*)    Neutro Abs 8.8 (*)    All other components within normal limits  LACTIC ACID, PLASMA  LACTIC ACID, PLASMA  URINALYSIS, W/ REFLEX TO CULTURE (INFECTION SUSPECTED)  Radiology DG Chest Port 1 View  Result Date: 05/08/2023 CLINICAL DATA:  Hypotension. Generalized abdominal pain beginning yesterday. Nausea. EXAM: PORTABLE CHEST 1 VIEW COMPARISON:  12/15/2020 FINDINGS: Heart size and pulmonary vascularity are normal. Lungs are clear. No pleural effusions. No pneumothorax. Mediastinal contours appear intact. Postoperative changes in the cervical spine. Degenerative changes in the thoracic spine and shoulders. IMPRESSION: No active disease. Electronically Signed   By: Burman Nieves M.D.   On: 05/08/2023 22:55    Pertinent labs & imaging results that were available during my care of the patient were reviewed by me and considered in my medical decision making (see MDM for details).  Medications Ordered in ED Medications  lactated ringers bolus 1,000 mL (0 mLs Intravenous Stopped 05/08/23 2105)  iohexol (OMNIPAQUE)  300 MG/ML solution 100 mL (100 mLs Intravenous Contrast Given 05/08/23 2148)  fentaNYL (SUBLIMAZE) injection 50 mcg (50 mcg Intravenous Given 05/08/23 2215)                                                                                                                                     Procedures Procedures  (including critical care time)  Medical Decision Making / ED Course   This patient presents to the ED for concern of abdominal pain, this involves an extensive number of treatment options, and is a complaint that carries with it a high risk of complications and morbidity.  The differential diagnosis includes cystitis, diverticulitis, intra-abdominal infection, cholecystitis, bowel obstruction.  MDM: 72 year old female here today for abdominal pain.  On exam, patient with tenderness in the suprapubic region.  Urinalysis ordered.  Will obtain imaging of the patient's abdomen pelvis.  Patient nontachycardic, afebrile. I do not believe this patient has sepsis.    Additional history obtained: -Additional history obtained from patient's sister. -External records from outside source obtained and reviewed including: Chart review including previous notes, labs, imaging, consultation notes   Lab Tests: -I ordered, reviewed, and interpreted labs.   The pertinent results include:   Labs Reviewed  COMPREHENSIVE METABOLIC PANEL - Abnormal; Notable for the following components:      Result Value   Glucose, Bld 127 (*)    All other components within normal limits  CBC WITH DIFFERENTIAL/PLATELET - Abnormal; Notable for the following components:   WBC 10.6 (*)    RBC 3.77 (*)    HCT 35.3 (*)    Neutro Abs 8.8 (*)    All other components within normal limits  LACTIC ACID, PLASMA  LACTIC ACID, PLASMA  URINALYSIS, W/ REFLEX TO CULTURE (INFECTION SUSPECTED)      EKG   EKG Interpretation Date/Time:    Ventricular Rate:    PR Interval:    QRS Duration:    QT Interval:    QTC  Calculation:   R Axis:      Text Interpretation:           Imaging Studies ordered: I  ordered imaging studies including CT imaging of the abdomen pelvis I independently visualized and interpreted imaging. I agree with the radiologist interpretation   Medicines ordered and prescription drug management: Meds ordered this encounter  Medications   lactated ringers bolus 1,000 mL   iohexol (OMNIPAQUE) 300 MG/ML solution 100 mL   fentaNYL (SUBLIMAZE) injection 50 mcg    -I have reviewed the patients home medicines and have made adjustments as needed  Critical interventions Pain management     Cardiac Monitoring: The patient was maintained on a cardiac monitor.  I personally viewed and interpreted the cardiac monitored which showed an underlying rhythm of: Sinus rhythm     Reevaluation: After the interventions noted above, I reevaluated the patient and found that they have :improved  Co morbidities that complicate the patient evaluation  Past Medical History:  Diagnosis Date   Abdominal pain, epigastric 03/27/2014   Achilles tendinitis of right lower extremity 02/12/2018   Acne    Anemia    Anxiety    Arthritis    Bone spur of right foot 02/16/2018   Formatting of this note might be different from the original. Added automatically from request for surgery [id number]   Carpal tunnel syndrome on left 10/31/2016   Cervical spondylosis without myelopathy 10/31/2016   Colon polyps    Depression    Diarrhea    C. Diff   Difficult intubation    patient stated "had to use a smaller tube" in previous surgery in 80's   GERD 03/28/2007   Qualifier: Diagnosis of  By: Tawanna Cooler RN, Alvino Chapel     GERD (gastroesophageal reflux disease)    Hematuria 03/28/2007   Qualifier: Diagnosis of  By: Everett Graff     History of colonic polyps 05/08/2008   Qualifier: Diagnosis of  By: Tawanna Cooler MD, Tinnie Gens A    Hyperlipidemia    Insomnia    Medication reaction 12/26/2015   Migraine    Mitral valve  disease 08/03/2009   Formatting of this note might be different from the original. Overview:  Qualifier: Diagnosis of  By: Tawanna Cooler MD, Tinnie Gens A   MITRAL VALVE PROLAPSE 08/03/2009   Qualifier: Diagnosis of  By: Tawanna Cooler MD, Eugenio Hoes    OP (osteoporosis)    Osteoarthritis of right hip 10/31/2013   Osteoporosis 03/28/2007   Qualifier: Diagnosis of  By: Tawanna Cooler, RN, Moreen Fowler of this note might be different from the original. Overview:  Qualifier: Diagnosis of  By: Tawanna Cooler RN, Alvino Chapel   Panic attacks 02/17/2014   Pneumonia    Primary osteoarthritis of first carpometacarpal joint of left hand 10/31/2016   Spinal stenosis, cervical region 01/13/2017   Trigger index finger of left hand 12/02/2019      Dispostion: Patient signed out to Dr. Judd Lien pending CT imaging and disposition.     Final Clinical Impression(s) / ED Diagnoses Final diagnoses:  None     @PCDICTATION @    Anders Simmonds T, DO 05/08/23 2319

## 2023-05-08 NOTE — ED Notes (Signed)
Placed in trendelenberg position.

## 2023-05-09 ENCOUNTER — Emergency Department (HOSPITAL_COMMUNITY): Payer: Medicare Other | Admitting: Anesthesiology

## 2023-05-09 ENCOUNTER — Encounter (HOSPITAL_COMMUNITY): Admission: EM | Disposition: A | Discharge status: Home / Self Care | Payer: Self-pay | Source: Home / Self Care

## 2023-05-09 ENCOUNTER — Other Ambulatory Visit: Payer: Self-pay

## 2023-05-09 ENCOUNTER — Encounter (HOSPITAL_COMMUNITY): Payer: Self-pay

## 2023-05-09 ENCOUNTER — Inpatient Hospital Stay: Admit: 2023-05-09 | Payer: Medicare Other | Admitting: General Surgery

## 2023-05-09 DIAGNOSIS — M1611 Unilateral primary osteoarthritis, right hip: Secondary | ICD-10-CM | POA: Diagnosis present

## 2023-05-09 DIAGNOSIS — K567 Ileus, unspecified: Secondary | ICD-10-CM | POA: Diagnosis not present

## 2023-05-09 DIAGNOSIS — Z860100 Personal history of colon polyps, unspecified: Secondary | ICD-10-CM | POA: Diagnosis not present

## 2023-05-09 DIAGNOSIS — M810 Age-related osteoporosis without current pathological fracture: Secondary | ICD-10-CM | POA: Diagnosis present

## 2023-05-09 DIAGNOSIS — Z800 Family history of malignant neoplasm of digestive organs: Secondary | ICD-10-CM | POA: Diagnosis not present

## 2023-05-09 DIAGNOSIS — K3533 Acute appendicitis with perforation and localized peritonitis, with abscess: Secondary | ICD-10-CM | POA: Diagnosis present

## 2023-05-09 DIAGNOSIS — Z83511 Family history of glaucoma: Secondary | ICD-10-CM | POA: Diagnosis not present

## 2023-05-09 DIAGNOSIS — K3580 Unspecified acute appendicitis: Secondary | ICD-10-CM | POA: Diagnosis present

## 2023-05-09 DIAGNOSIS — F32A Depression, unspecified: Secondary | ICD-10-CM | POA: Diagnosis present

## 2023-05-09 DIAGNOSIS — I341 Nonrheumatic mitral (valve) prolapse: Secondary | ICD-10-CM | POA: Diagnosis present

## 2023-05-09 DIAGNOSIS — Z818 Family history of other mental and behavioral disorders: Secondary | ICD-10-CM | POA: Diagnosis not present

## 2023-05-09 DIAGNOSIS — Z8249 Family history of ischemic heart disease and other diseases of the circulatory system: Secondary | ICD-10-CM | POA: Diagnosis not present

## 2023-05-09 DIAGNOSIS — G43909 Migraine, unspecified, not intractable, without status migrainosus: Secondary | ICD-10-CM | POA: Diagnosis present

## 2023-05-09 DIAGNOSIS — K219 Gastro-esophageal reflux disease without esophagitis: Secondary | ICD-10-CM | POA: Diagnosis present

## 2023-05-09 DIAGNOSIS — Z8349 Family history of other endocrine, nutritional and metabolic diseases: Secondary | ICD-10-CM | POA: Diagnosis not present

## 2023-05-09 DIAGNOSIS — K9189 Other postprocedural complications and disorders of digestive system: Secondary | ICD-10-CM | POA: Diagnosis not present

## 2023-05-09 DIAGNOSIS — Z823 Family history of stroke: Secondary | ICD-10-CM | POA: Diagnosis not present

## 2023-05-09 DIAGNOSIS — Z87891 Personal history of nicotine dependence: Secondary | ICD-10-CM | POA: Diagnosis not present

## 2023-05-09 DIAGNOSIS — K35219 Acute appendicitis with generalized peritonitis, with abscess, unspecified as to perforation: Secondary | ICD-10-CM | POA: Diagnosis not present

## 2023-05-09 DIAGNOSIS — F419 Anxiety disorder, unspecified: Secondary | ICD-10-CM | POA: Diagnosis present

## 2023-05-09 DIAGNOSIS — Z8261 Family history of arthritis: Secondary | ICD-10-CM | POA: Diagnosis not present

## 2023-05-09 DIAGNOSIS — Z83438 Family history of other disorder of lipoprotein metabolism and other lipidemia: Secondary | ICD-10-CM | POA: Diagnosis not present

## 2023-05-09 DIAGNOSIS — Z833 Family history of diabetes mellitus: Secondary | ICD-10-CM | POA: Diagnosis not present

## 2023-05-09 DIAGNOSIS — Z803 Family history of malignant neoplasm of breast: Secondary | ICD-10-CM | POA: Diagnosis not present

## 2023-05-09 DIAGNOSIS — G4700 Insomnia, unspecified: Secondary | ICD-10-CM | POA: Diagnosis present

## 2023-05-09 DIAGNOSIS — E785 Hyperlipidemia, unspecified: Secondary | ICD-10-CM | POA: Diagnosis present

## 2023-05-09 HISTORY — PX: LAPAROSCOPIC APPENDECTOMY: SHX408

## 2023-05-09 SURGERY — APPENDECTOMY, LAPAROSCOPIC
Anesthesia: General | Site: Abdomen

## 2023-05-09 MED ORDER — MORPHINE SULFATE (PF) 2 MG/ML IV SOLN
2.0000 mg | INTRAVENOUS | Status: DC | PRN
  Administered 2023-05-09 – 2023-05-11 (×3): 4 mg via INTRAVENOUS
  Filled 2023-05-09 (×3): qty 2

## 2023-05-09 MED ORDER — MORPHINE SULFATE (PF) 4 MG/ML IV SOLN
4.0000 mg | Freq: Once | INTRAVENOUS | Status: AC
  Administered 2023-05-09: 4 mg via INTRAVENOUS
  Filled 2023-05-09: qty 1

## 2023-05-09 MED ORDER — METRONIDAZOLE 500 MG/100ML IV SOLN
500.0000 mg | Freq: Two times a day (BID) | INTRAVENOUS | Status: DC
  Administered 2023-05-09 – 2023-05-10 (×2): 500 mg via INTRAVENOUS
  Filled 2023-05-09 (×2): qty 100

## 2023-05-09 MED ORDER — DOCUSATE SODIUM 50 MG/5ML PO LIQD
100.0000 mg | Freq: Two times a day (BID) | ORAL | Status: DC
  Administered 2023-05-09: 100 mg via ORAL
  Filled 2023-05-09 (×2): qty 10

## 2023-05-09 MED ORDER — OXYCODONE HCL 5 MG PO TABS
5.0000 mg | ORAL_TABLET | Freq: Once | ORAL | Status: AC
  Administered 2023-05-09: 5 mg via ORAL
  Filled 2023-05-09: qty 1

## 2023-05-09 MED ORDER — DIPHENHYDRAMINE HCL 12.5 MG/5ML PO ELIX: 12.5000 mg | ORAL_SOLUTION | Freq: Four times a day (QID) | ORAL | Status: DC | PRN

## 2023-05-09 MED ORDER — ONDANSETRON HCL 4 MG/2ML IJ SOLN
INTRAMUSCULAR | Status: AC
  Filled 2023-05-09: qty 2

## 2023-05-09 MED ORDER — SODIUM CHLORIDE 0.9 % IV SOLN
1.0000 g | Freq: Once | INTRAVENOUS | Status: AC
  Administered 2023-05-09: 1 g via INTRAVENOUS
  Filled 2023-05-09: qty 10

## 2023-05-09 MED ORDER — SIMETHICONE 80 MG PO CHEW: 40.0000 mg | CHEWABLE_TABLET | Freq: Four times a day (QID) | ORAL | Status: DC | PRN

## 2023-05-09 MED ORDER — LIDOCAINE 2% (20 MG/ML) 5 ML SYRINGE
INTRAMUSCULAR | Status: DC | PRN
  Administered 2023-05-09: 100 mg via INTRAVENOUS

## 2023-05-09 MED ORDER — LIDOCAINE 2% (20 MG/ML) 5 ML SYRINGE
INTRAMUSCULAR | Status: AC
  Filled 2023-05-09: qty 5

## 2023-05-09 MED ORDER — PHENYLEPHRINE 80 MCG/ML (10ML) SYRINGE FOR IV PUSH (FOR BLOOD PRESSURE SUPPORT)
PREFILLED_SYRINGE | INTRAVENOUS | Status: AC
  Filled 2023-05-09: qty 10

## 2023-05-09 MED ORDER — ORAL CARE MOUTH RINSE: 15.0000 mL | Freq: Once | OROMUCOSAL | Status: AC

## 2023-05-09 MED ORDER — PROPOFOL 10 MG/ML IV BOLUS
INTRAVENOUS | Status: AC
  Filled 2023-05-09: qty 20

## 2023-05-09 MED ORDER — FENTANYL CITRATE (PF) 100 MCG/2ML IJ SOLN
25.0000 ug | INTRAMUSCULAR | Status: DC | PRN
  Administered 2023-05-09 (×2): 25 ug via INTRAVENOUS

## 2023-05-09 MED ORDER — SODIUM CHLORIDE 0.9 % IR SOLN
Status: DC | PRN
  Administered 2023-05-09: 1

## 2023-05-09 MED ORDER — METRONIDAZOLE 500 MG/100ML IV SOLN
500.0000 mg | Freq: Once | INTRAVENOUS | Status: AC
  Administered 2023-05-09: 500 mg via INTRAVENOUS
  Filled 2023-05-09: qty 100

## 2023-05-09 MED ORDER — FENTANYL CITRATE (PF) 250 MCG/5ML IJ SOLN
INTRAMUSCULAR | Status: DC | PRN
  Administered 2023-05-09 (×5): 50 ug via INTRAVENOUS

## 2023-05-09 MED ORDER — EPHEDRINE 5 MG/ML INJ
INTRAVENOUS | Status: AC
  Filled 2023-05-09: qty 5

## 2023-05-09 MED ORDER — KETOROLAC TROMETHAMINE 30 MG/ML IJ SOLN: 15.0000 mg | Freq: Once | INTRAMUSCULAR | Status: DC | PRN

## 2023-05-09 MED ORDER — DIPHENHYDRAMINE HCL 50 MG/ML IJ SOLN: 12.5000 mg | Freq: Four times a day (QID) | INTRAMUSCULAR | Status: DC | PRN

## 2023-05-09 MED ORDER — CHLORHEXIDINE GLUCONATE CLOTH 2 % EX PADS: 6.0000 | MEDICATED_PAD | Freq: Once | CUTANEOUS | Status: DC

## 2023-05-09 MED ORDER — VENLAFAXINE HCL ER 75 MG PO CP24
75.0000 mg | ORAL_CAPSULE | Freq: Every day | ORAL | Status: DC
  Administered 2023-05-10 – 2023-05-12 (×3): 75 mg via ORAL
  Filled 2023-05-09 (×3): qty 1

## 2023-05-09 MED ORDER — DEXTROSE-SODIUM CHLORIDE 5-0.45 % IV SOLN: INTRAVENOUS | Status: AC

## 2023-05-09 MED ORDER — ACETAMINOPHEN 10 MG/ML IV SOLN: 1000.0000 mg | Freq: Four times a day (QID) | INTRAVENOUS | Status: DC

## 2023-05-09 MED ORDER — SUGAMMADEX SODIUM 200 MG/2ML IV SOLN
INTRAVENOUS | Status: DC | PRN
  Administered 2023-05-09: 200 mg via INTRAVENOUS

## 2023-05-09 MED ORDER — METOPROLOL TARTRATE 12.5 MG HALF TABLET: 12.5000 mg | ORAL_TABLET | Freq: Every day | ORAL | Status: DC | PRN

## 2023-05-09 MED ORDER — ACETAMINOPHEN 500 MG PO TABS: 1000.0000 mg | ORAL_TABLET | Freq: Four times a day (QID) | ORAL | Status: DC

## 2023-05-09 MED ORDER — PANTOPRAZOLE SODIUM 40 MG PO TBEC: 40.0000 mg | DELAYED_RELEASE_TABLET | Freq: Every day | ORAL | Status: DC

## 2023-05-09 MED ORDER — LACTATED RINGERS IV SOLN: INTRAVENOUS | Status: DC

## 2023-05-09 MED ORDER — PROPOFOL 10 MG/ML IV BOLUS
INTRAVENOUS | Status: DC | PRN
  Administered 2023-05-09: 100 mg via INTRAVENOUS

## 2023-05-09 MED ORDER — ROCURONIUM BROMIDE 10 MG/ML (PF) SYRINGE
PREFILLED_SYRINGE | INTRAVENOUS | Status: AC
  Filled 2023-05-09: qty 10

## 2023-05-09 MED ORDER — FAMOTIDINE 40 MG/5ML PO SUSR
20.0000 mg | Freq: Every day | ORAL | Status: DC
  Administered 2023-05-09: 20 mg via ORAL
  Filled 2023-05-09 (×3): qty 2.5

## 2023-05-09 MED ORDER — TIZANIDINE HCL 2 MG PO TABS
4.0000 mg | ORAL_TABLET | Freq: Every day | ORAL | Status: DC
  Administered 2023-05-09 – 2023-05-11 (×3): 4 mg via ORAL
  Filled 2023-05-09 (×3): qty 2

## 2023-05-09 MED ORDER — PHENYLEPHRINE HCL-NACL 20-0.9 MG/250ML-% IV SOLN
INTRAVENOUS | Status: DC | PRN
  Administered 2023-05-09: 25 ug/min via INTRAVENOUS

## 2023-05-09 MED ORDER — LATANOPROST 0.005 % OP SOLN
1.0000 [drp] | Freq: Every day | OPHTHALMIC | Status: DC
  Administered 2023-05-09 – 2023-05-11 (×3): 1 [drp] via OPHTHALMIC
  Filled 2023-05-09: qty 2.5

## 2023-05-09 MED ORDER — BUPIVACAINE-EPINEPHRINE 0.25% -1:200000 IJ SOLN
INTRAMUSCULAR | Status: DC | PRN
  Administered 2023-05-09: 6 mL

## 2023-05-09 MED ORDER — ONDANSETRON HCL 4 MG/2ML IJ SOLN
4.0000 mg | Freq: Four times a day (QID) | INTRAMUSCULAR | Status: DC | PRN
Start: 2023-05-09 — End: 2023-05-12
  Administered 2023-05-10 (×2): 4 mg via INTRAVENOUS
  Filled 2023-05-09 (×2): qty 2

## 2023-05-09 MED ORDER — OXYCODONE HCL 5 MG PO TABS: 5.0000 mg | ORAL_TABLET | Freq: Once | ORAL | Status: DC | PRN

## 2023-05-09 MED ORDER — DOCUSATE SODIUM 100 MG PO CAPS
100.0000 mg | ORAL_CAPSULE | Freq: Two times a day (BID) | ORAL | Status: DC
  Filled 2023-05-09: qty 1

## 2023-05-09 MED ORDER — OXYCODONE HCL 5 MG/5ML PO SOLN: 5.0000 mg | Freq: Once | ORAL | Status: DC | PRN

## 2023-05-09 MED ORDER — FENTANYL CITRATE (PF) 250 MCG/5ML IJ SOLN
INTRAMUSCULAR | Status: AC
  Filled 2023-05-09: qty 5

## 2023-05-09 MED ORDER — OXYCODONE HCL 5 MG/5ML PO SOLN
5.0000 mg | ORAL | Status: DC | PRN
  Administered 2023-05-10: 10 mg via ORAL
  Filled 2023-05-09: qty 10

## 2023-05-09 MED ORDER — MORPHINE SULFATE (PF) 4 MG/ML IV SOLN
4.0000 mg | Freq: Once | INTRAVENOUS | Status: AC
Start: 2023-05-09 — End: 2023-05-09
  Administered 2023-05-09: 4 mg via INTRAVENOUS
  Filled 2023-05-09: qty 1

## 2023-05-09 MED ORDER — SODIUM CHLORIDE 0.9 % IV SOLN: INTRAVENOUS | Status: DC

## 2023-05-09 MED ORDER — ROCURONIUM BROMIDE 10 MG/ML (PF) SYRINGE
PREFILLED_SYRINGE | INTRAVENOUS | Status: DC | PRN
  Administered 2023-05-09: 30 mg via INTRAVENOUS

## 2023-05-09 MED ORDER — ONDANSETRON 4 MG PO TBDP
4.0000 mg | ORAL_TABLET | Freq: Four times a day (QID) | ORAL | Status: DC | PRN
  Administered 2023-05-11: 4 mg via ORAL
  Filled 2023-05-09: qty 1

## 2023-05-09 MED ORDER — ENOXAPARIN SODIUM 40 MG/0.4ML IJ SOSY
40.0000 mg | PREFILLED_SYRINGE | INTRAMUSCULAR | Status: DC
  Administered 2023-05-09 – 2023-05-11 (×3): 40 mg via SUBCUTANEOUS
  Filled 2023-05-09 (×4): qty 0.4

## 2023-05-09 MED ORDER — SUCCINYLCHOLINE CHLORIDE 200 MG/10ML IV SOSY
PREFILLED_SYRINGE | INTRAVENOUS | Status: DC | PRN
  Administered 2023-05-09: 100 mg via INTRAVENOUS

## 2023-05-09 MED ORDER — SODIUM CHLORIDE 0.9 % IV SOLN
2.0000 g | INTRAVENOUS | Status: DC
  Administered 2023-05-09 – 2023-05-11 (×3): 2 g via INTRAVENOUS
  Filled 2023-05-09 (×3): qty 20

## 2023-05-09 MED ORDER — CHLORHEXIDINE GLUCONATE 0.12 % MT SOLN
15.0000 mL | Freq: Once | OROMUCOSAL | Status: AC
  Administered 2023-05-09: 15 mL via OROMUCOSAL

## 2023-05-09 MED ORDER — FENTANYL CITRATE (PF) 100 MCG/2ML IJ SOLN
INTRAMUSCULAR | Status: AC
  Filled 2023-05-09: qty 2

## 2023-05-09 MED ORDER — IBUPROFEN 600 MG PO TABS
600.0000 mg | ORAL_TABLET | Freq: Four times a day (QID) | ORAL | Status: DC | PRN
Start: 2023-05-09 — End: 2023-05-09

## 2023-05-09 MED ORDER — ALPRAZOLAM 0.25 MG PO TABS: 0.2500 mg | ORAL_TABLET | Freq: Every day | ORAL | Status: DC | PRN

## 2023-05-09 MED ORDER — OXYCODONE HCL 5 MG PO TABS
5.0000 mg | ORAL_TABLET | ORAL | Status: DC | PRN
  Administered 2023-05-09: 10 mg via ORAL
  Filled 2023-05-09: qty 2

## 2023-05-09 MED ORDER — ACETAMINOPHEN 500 MG PO TABS
1000.0000 mg | ORAL_TABLET | Freq: Once | ORAL | Status: AC
Start: 2023-05-09 — End: 2023-05-09
  Administered 2023-05-09: 1000 mg via ORAL
  Filled 2023-05-09: qty 2

## 2023-05-09 MED ORDER — DEXAMETHASONE SODIUM PHOSPHATE 10 MG/ML IJ SOLN
INTRAMUSCULAR | Status: AC
  Filled 2023-05-09: qty 1

## 2023-05-09 MED ORDER — DEXAMETHASONE SODIUM PHOSPHATE 10 MG/ML IJ SOLN
INTRAMUSCULAR | Status: DC | PRN
  Administered 2023-05-09: 4 mg via INTRAVENOUS

## 2023-05-09 MED ORDER — ACETAMINOPHEN 160 MG/5ML PO SOLN
1000.0000 mg | Freq: Four times a day (QID) | ORAL | Status: DC
  Administered 2023-05-09: 1000 mg via ORAL
  Filled 2023-05-09: qty 40.6

## 2023-05-09 MED ORDER — STERILE WATER FOR IRRIGATION IR SOLN
Status: DC | PRN
  Administered 2023-05-09: 1000 mL

## 2023-05-09 MED ORDER — BUPIVACAINE-EPINEPHRINE (PF) 0.25% -1:200000 IJ SOLN
INTRAMUSCULAR | Status: AC
  Filled 2023-05-09: qty 30

## 2023-05-09 MED ORDER — ONDANSETRON HCL 4 MG/2ML IJ SOLN
4.0000 mg | Freq: Once | INTRAMUSCULAR | Status: AC | PRN
  Administered 2023-05-09: 4 mg via INTRAVENOUS

## 2023-05-09 MED ORDER — MIDAZOLAM HCL 2 MG/2ML IJ SOLN
INTRAMUSCULAR | Status: AC
Start: 2023-05-09 — End: ?
  Filled 2023-05-09: qty 2

## 2023-05-09 MED ORDER — PHENYLEPHRINE 80 MCG/ML (10ML) SYRINGE FOR IV PUSH (FOR BLOOD PRESSURE SUPPORT)
PREFILLED_SYRINGE | INTRAVENOUS | Status: DC | PRN
  Administered 2023-05-09 (×2): 80 ug via INTRAVENOUS

## 2023-05-09 MED ORDER — ONDANSETRON HCL 4 MG/2ML IJ SOLN
4.0000 mg | Freq: Once | INTRAMUSCULAR | Status: AC
  Administered 2023-05-09: 4 mg via INTRAVENOUS
  Filled 2023-05-09: qty 2

## 2023-05-09 SURGICAL SUPPLY — 49 items
ADH SKN CLS APL DERMABOND .7 (GAUZE/BANDAGES/DRESSINGS) ×1
APL PRP STRL LF DISP 70% ISPRP (MISCELLANEOUS) ×1
APPLIER CLIP 5 13 M/L LIGAMAX5 (MISCELLANEOUS)
APR CLP MED LRG 5 ANG JAW (MISCELLANEOUS)
BAG COUNTER SPONGE SURGICOUNT (BAG) ×1 IMPLANT
BAG SPEC RTRVL 10 TROC 200 (ENDOMECHANICALS) ×1
BAG SPNG CNTER NS LX DISP (BAG) ×1
CANISTER SUCT 3000ML PPV (MISCELLANEOUS) ×1 IMPLANT
CHLORAPREP W/TINT 26 (MISCELLANEOUS) ×1 IMPLANT
CLIP APPLIE 5 13 M/L LIGAMAX5 (MISCELLANEOUS) IMPLANT
CLSR STERI-STRIP ANTIMIC 1/2X4 (GAUZE/BANDAGES/DRESSINGS) IMPLANT
COVER SURGICAL LIGHT HANDLE (MISCELLANEOUS) ×1 IMPLANT
CUTTER FLEX LINEAR 45M (STAPLE) ×1 IMPLANT
DERMABOND ADVANCED .7 DNX12 (GAUZE/BANDAGES/DRESSINGS) ×1 IMPLANT
ELECT REM PT RETURN 9FT ADLT (ELECTROSURGICAL) ×1
ELECTRODE REM PT RTRN 9FT ADLT (ELECTROSURGICAL) ×1 IMPLANT
GLOVE BIO SURGEON STRL SZ7 (GLOVE) ×1 IMPLANT
GLOVE BIOGEL PI IND STRL 7.5 (GLOVE) ×1 IMPLANT
GOWN STRL REUS W/ TWL LRG LVL3 (GOWN DISPOSABLE) ×3 IMPLANT
GOWN STRL REUS W/TWL LRG LVL3 (GOWN DISPOSABLE) ×3
GRASPER SUT TROCAR 14GX15 (MISCELLANEOUS) ×1 IMPLANT
IRRIG SUCT STRYKERFLOW 2 WTIP (MISCELLANEOUS) ×1
IRRIGATION SUCT STRKRFLW 2 WTP (MISCELLANEOUS) ×1 IMPLANT
KIT BASIN OR (CUSTOM PROCEDURE TRAY) ×1 IMPLANT
KIT TURNOVER KIT B (KITS) ×1 IMPLANT
NS IRRIG 1000ML POUR BTL (IV SOLUTION) ×1 IMPLANT
PAD ARMBOARD 7.5X6 YLW CONV (MISCELLANEOUS) ×2 IMPLANT
POUCH RETRIEVAL ECOSAC 10 (ENDOMECHANICALS) ×1 IMPLANT
RELOAD 45 VASCULAR/THIN (ENDOMECHANICALS) IMPLANT
RELOAD STAPLE 45 2.5 WHT GRN (ENDOMECHANICALS) IMPLANT
RELOAD STAPLE 45 3.5 BLU ETS (ENDOMECHANICALS) IMPLANT
RELOAD STAPLE TA45 3.5 REG BLU (ENDOMECHANICALS) ×1 IMPLANT
SCISSORS LAP 5X35 DISP (ENDOMECHANICALS) IMPLANT
SET TUBE SMOKE EVAC HIGH FLOW (TUBING) ×1 IMPLANT
SHEARS HARMONIC ACE PLUS 36CM (ENDOMECHANICALS) ×1 IMPLANT
SLEEVE Z-THREAD 5X100MM (TROCAR) ×1 IMPLANT
SPECIMEN JAR SMALL (MISCELLANEOUS) ×1 IMPLANT
STRIP CLOSURE SKIN 1/2X4 (GAUZE/BANDAGES/DRESSINGS) ×1 IMPLANT
SUT MNCRL AB 4-0 PS2 18 (SUTURE) ×1 IMPLANT
SUT VICRYL 0 UR6 27IN ABS (SUTURE) ×1 IMPLANT
TOWEL GREEN STERILE (TOWEL DISPOSABLE) ×1 IMPLANT
TOWEL GREEN STERILE FF (TOWEL DISPOSABLE) ×1 IMPLANT
TRAY FOLEY MTR SLVR 16FR STAT (SET/KITS/TRAYS/PACK) IMPLANT
TRAY FOLEY W/BAG SLVR 14FR (SET/KITS/TRAYS/PACK) IMPLANT
TRAY LAPAROSCOPIC MC (CUSTOM PROCEDURE TRAY) ×1 IMPLANT
TROCAR BALLN 12MMX100 BLUNT (TROCAR) ×1 IMPLANT
TROCAR Z-THREAD OPTICAL 5X100M (TROCAR) ×1 IMPLANT
WARMER LAPAROSCOPE (MISCELLANEOUS) ×1 IMPLANT
WATER STERILE IRR 1000ML POUR (IV SOLUTION) ×1 IMPLANT

## 2023-05-09 NOTE — H&P (Signed)
[facility] Admission Note  Tonya Pope 01-06-1951  [id number].    Requesting MD: Geoffery Lyons Chief Complaint/Reason for Consult: appendicitis  HPI:  Tonya Pope is a 72 y.o. female h/o palpitations who presented to the ED last night for evaluation of abdominal pain. Her symptoms started just over 36 hours ago. She reports onset of generalized abdominal pain that has now localized to the RLQ. Associated with nausea and temperature up to 100.8. Denies emesis, diarrhea, or constipation.  Patient was worked up by EDP and found to have non-perforated appendicitis on CT scan. She is mildly tachy with soft Bps and temp 100.5 in the ED. She was transferred to [facility] for evaluation by general surgery.  Abdominal surgical history: hysterectomy, multiple laparoscopies for endometriosis Anticoagulants: none Nonsmoker Drinks alcohol occasionally Denies illicit drug use Lives at home alone Employment: retired   Family History  Problem Relation Age of Onset   Stroke Mother    Arthritis Mother    Diabetes Mother    Heart disease Mother    Hypertension Mother    Hyperlipidemia Father    Glaucoma Father    Thyroid disease Sister    Breast cancer Maternal Aunt    Colon cancer Maternal Grandfather    Depression Sister    Colon cancer Paternal Grandmother     Past Medical History:  Diagnosis Date   Abdominal pain, epigastric 03/27/2014   Achilles tendinitis of right lower extremity 02/12/2018   Acne    Anemia    Anxiety    Arthritis    Bone spur of right foot 02/16/2018   Formatting of this note might be different from the original. Added automatically from request for surgery [id number]   Carpal tunnel syndrome on left 10/31/2016   Cervical spondylosis without myelopathy 10/31/2016   Colon polyps    Depression    Diarrhea    C. Diff   Difficult intubation    patient stated "had to use a smaller tube" in previous surgery in 80's   GERD 03/28/2007   Qualifier:  Diagnosis of  By: Tawanna Cooler RN, Alvino Chapel     GERD (gastroesophageal reflux disease)    Hematuria 03/28/2007   Qualifier: Diagnosis of  By: Everett Graff     History of colonic polyps 05/08/2008   Qualifier: Diagnosis of  By: Tawanna Cooler MD, Tinnie Gens A    Hyperlipidemia    Insomnia    Medication reaction 12/26/2015   Migraine    Mitral valve disease 08/03/2009   Formatting of this note might be different from the original. Overview:  Qualifier: Diagnosis of  By: Tawanna Cooler MD, Tinnie Gens A   MITRAL VALVE PROLAPSE 08/03/2009   Qualifier: Diagnosis of  By: Tawanna Cooler MD, Eugenio Hoes    OP (osteoporosis)    Osteoarthritis of right hip 10/31/2013   Osteoporosis 03/28/2007   Qualifier: Diagnosis of  By: Tawanna Cooler, RN, Moreen Fowler of this note might be different from the original. Overview:  Qualifier: Diagnosis of  By: Tawanna Cooler, RN, Alvino Chapel   Panic attacks 02/17/2014   Pneumonia    Primary osteoarthritis of first carpometacarpal joint of left hand 10/31/2016   Spinal stenosis, cervical region 01/13/2017   Trigger index finger of left hand 12/02/2019    Past Surgical History:  Procedure Laterality Date   ABDOMINAL HYSTERECTOMY     ANTERIOR CERVICAL DECOMP/DISCECTOMY FUSION N/A 01/13/2017   Procedure: ACDF - C4-C5 - C5-C6;  Surgeon: Donalee Citrin, MD;  Location: [facility] OR;  Service: Neurosurgery;  Laterality:  N/A;   CARPAL TUNNEL RELEASE Left 11/15/2016   Procedure: LEFT CARPAL TUNNEL RELEASE;  Surgeon: Cindee Salt, MD;  Location:  SURGERY CENTER;  Service: Orthopedics;  Laterality: Left;   CARPAL TUNNEL RELEASE Right 08/18/2016   COLONOSCOPY W/ POLYPECTOMY     DIAGNOSTIC LAPAROSCOPY     for endometriosis   ENDOMETRIAL ABLATION     TONSILLECTOMY     TONSILLECTOMY AND ADENOIDECTOMY      Social History:  reports that she has quit smoking. She has never used smokeless tobacco. She reports current alcohol use of about 12.0 - 14.0 standard drinks of alcohol per week. She reports that she does not use drugs.  Allergies:   Allergies  Allergen Reactions   Epinephrine Other (See Comments)    Pass out / dizzy   Promethazine Hcl Other (See Comments)    UNSPECIFIED CNS EFFECTS   Codeine Phosphate Nausea Only   Penicillins Rash    Facility-Administered Medications Prior to Admission  Medication Dose Route Frequency Provider Last Rate Last Admin   0.9 %  sodium chloride infusion  500 mL Intravenous Continuous Hilarie Fredrickson, MD       Medications Prior to Admission  Medication Sig Dispense Refill   ALPRAZolam (XANAX) 0.25 MG tablet Take 0.25 mg by mouth daily as needed for anxiety.     Calcium Carb-Cholecalciferol (CALCIUM/VITAMIN D) 600-400 MG-UNIT TABS Take 1 tablet by mouth 2 (two) times daily.     cetirizine (ZYRTEC) 10 MG tablet Take 10 mg by mouth daily.     conjugated estrogens (PREMARIN) vaginal cream Place 1 Applicatorful vaginally 2 (two) times a week. 90 g 1   cyclobenzaprine (FLEXERIL) 10 MG tablet Take 1 tablet (10 mg total) by mouth 2 (two) times daily as needed for muscle spasms. 20 tablet 0   doxycycline (VIBRAMYCIN) 100 MG capsule Take 1 capsule (100 mg total) by mouth 2 (two) times daily. 14 capsule 0   HYDROcodone-acetaminophen (NORCO/VICODIN) 5-325 MG tablet Take 1-2 tablets by mouth as needed for moderate pain or severe pain.     latanoprost (XALATAN) 0.005 % ophthalmic solution Place 1 drop into both eyes at bedtime.     loperamide (IMODIUM A-D) 2 MG tablet Take 4 mg by mouth as needed for diarrhea or loose stools.     meloxicam (MOBIC) 7.5 MG tablet Take 7.5 mg by mouth daily.     methocarbamol (ROBAXIN) 500 MG tablet Take 1 tablet by mouth as needed for muscle spasms.     methylPREDNISolone (MEDROL DOSEPAK) 4 MG TBPK tablet Follow package insert 21 each 0   metoprolol tartrate (LOPRESSOR) 25 MG tablet Take 0.5 tablets (12.5 mg total) by mouth daily as needed. 45 tablet 3   ondansetron (ZOFRAN-ODT) 4 MG disintegrating tablet Take 1 tablet (4 mg total) by mouth every 8 (eight) hours as  needed for nausea or vomiting. 8 tablet 0   oxyCODONE (ROXICODONE) 5 MG immediate release tablet Take 1 tablet (5 mg total) by mouth every 6 (six) hours as needed for up to 5 doses for severe pain or breakthrough pain. 5 tablet 0   pantoprazole (PROTONIX) 40 MG tablet Take 1 tablet (40 mg total) by mouth 2 (two) times daily. (Patient taking differently: Take 40 mg by mouth daily.) 180 tablet 3   potassium chloride SA (KLOR-CON M) 20 MEQ tablet Take 1 tablet (20 mEq total) by mouth daily. 90 tablet 3   saccharomyces boulardii (FLORASTOR) 250 MG capsule Take 1 capsule (250 mg total) by  mouth 2 (two) times daily. For 2 weeks 28 capsule 0   tiZANidine (ZANAFLEX) 4 MG tablet Take 4 mg by mouth at bedtime.     venlafaxine XR (EFFEXOR-XR) 75 MG 24 hr capsule Take 75 mg by mouth daily.      Prior to Admission medications   Medication Sig Start Date End Date Taking? Authorizing Provider  ALPRAZolam Prudy Feeler) 0.25 MG tablet Take 0.25 mg by mouth daily as needed for anxiety. 10/02/20   [provider]  Calcium Carb-Cholecalciferol (CALCIUM/VITAMIN D) 600-400 MG-UNIT TABS Take 1 tablet by mouth 2 (two) times daily.    [provider]  cetirizine (ZYRTEC) 10 MG tablet Take 10 mg by mouth daily.    [provider]  conjugated estrogens (PREMARIN) vaginal cream Place 1 Applicatorful vaginally 2 (two) times a week. 05/10/18   Nafziger, Kandee Keen, NP  cyclobenzaprine (FLEXERIL) 10 MG tablet Take 1 tablet (10 mg total) by mouth 2 (two) times daily as needed for muscle spasms. 07/22/22   Curatolo, Adam, DO  doxycycline (VIBRAMYCIN) 100 MG capsule Take 1 capsule (100 mg total) by mouth 2 (two) times daily. 12/15/20   Benjiman Core, MD  HYDROcodone-acetaminophen (NORCO/VICODIN) 5-325 MG tablet Take 1-2 tablets by mouth as needed for moderate pain or severe pain. 06/11/18   [provider]  latanoprost (XALATAN) 0.005 % ophthalmic solution Place 1 drop into both eyes at bedtime. 02/05/20    [provider]  loperamide (IMODIUM A-D) 2 MG tablet Take 4 mg by mouth as needed for diarrhea or loose stools.    [provider]  meloxicam (MOBIC) 7.5 MG tablet Take 7.5 mg by mouth daily.    [provider]  methocarbamol (ROBAXIN) 500 MG tablet Take 1 tablet by mouth as needed for muscle spasms. 09/12/19   [provider]  methylPREDNISolone (MEDROL DOSEPAK) 4 MG TBPK tablet Follow package insert 07/22/22   Curatolo, Adam, DO  metoprolol tartrate (LOPRESSOR) 25 MG tablet Take 0.5 tablets (12.5 mg total) by mouth daily as needed. 07/01/21 09/29/21  Baldo Daub, MD  ondansetron (ZOFRAN-ODT) 4 MG disintegrating tablet Take 1 tablet (4 mg total) by mouth every 8 (eight) hours as needed for nausea or vomiting. 12/15/20   Benjiman Core, MD  oxyCODONE (ROXICODONE) 5 MG immediate release tablet Take 1 tablet (5 mg total) by mouth every 6 (six) hours as needed for up to 5 doses for severe pain or breakthrough pain. 07/22/22   Curatolo, Adam, DO  pantoprazole (PROTONIX) 40 MG tablet Take 1 tablet (40 mg total) by mouth 2 (two) times daily. Patient taking differently: Take 40 mg by mouth daily. 05/09/18 07/01/21  Nafziger, Kandee Keen, NP  potassium chloride SA (KLOR-CON M) 20 MEQ tablet Take 1 tablet (20 mEq total) by mouth daily. 07/09/21   Baldo Daub, MD  saccharomyces boulardii (FLORASTOR) 250 MG capsule Take 1 capsule (250 mg total) by mouth 2 (two) times daily. For 2 weeks 04/24/17   Hilarie Fredrickson, MD  tiZANidine (ZANAFLEX) 4 MG tablet Take 4 mg by mouth at bedtime. 03/28/20   [provider]  venlafaxine XR (EFFEXOR-XR) 75 MG 24 hr capsule Take 75 mg by mouth daily. 03/06/20   [provider]    Blood pressure 91/79, pulse 100, temperature 99.5 F (37.5 C), resp. rate 18, height 5\' 1"  (1.549 m), weight 57.6 kg, SpO2 96%. Physical Exam: General: pleasant, WD/WN female who is laying in bed in NAD but appears uncomfortable HEENT: head is  normocephalic, atraumatic.  Sclera are noninjected.  Pupils equal and round.  Ears and nose without any masses or lesions.  Mouth is pink and moist. Dentition fair Heart: regular, rate, and rhythm.  Normal s1,s2. No obvious murmurs, gallops, or rubs noted.  Palpable radial and pedal pulses bilaterally  Lungs: CTAB, no wheezes, rhonchi, or rales noted.  Respiratory effort nonlabored  Abd: soft, mild distension, few BS heard, no masses, hernias, or organomegaly. Mild generalized tenderness with moderate RLQ TTP MS: no BUE/BLE edema, calves soft and nontender Skin: warm and dry with no masses, lesions, or rashes Psych: A&Ox4 with an appropriate affect Neuro: MAEs, no gross motor or sensory deficits BUE/BLE  Results for orders placed or performed during the hospital encounter of 05/08/23 (from the past 48 hour(s))  Urinalysis, w/ Reflex to Culture (Infection Suspected) -Urine, Clean Catch     Status: None   Collection Time: 05/08/23  8:31 PM  Result Value Ref Range   Specimen Source URINE, CLEAN CATCH    Color, Urine YELLOW YELLOW   APPearance CLEAR CLEAR   Specific Gravity, Urine 1.013 1.005 - 1.030   pH 7.0 5.0 - 8.0   Glucose, UA NEGATIVE NEGATIVE mg/dL   Hgb urine dipstick NEGATIVE NEGATIVE   Bilirubin Urine NEGATIVE NEGATIVE   Ketones, ur NEGATIVE NEGATIVE mg/dL   Protein, ur NEGATIVE NEGATIVE mg/dL   Nitrite NEGATIVE NEGATIVE   Leukocytes,Ua NEGATIVE NEGATIVE   RBC / HPF 0-5 0 - 5 RBC/hpf   WBC, UA 0-5 0 - 5 WBC/hpf    Comment:        Reflex urine culture not performed if WBC <=10, OR if Squamous epithelial cells >5. If Squamous epithelial cells >5 suggest recollection.    Bacteria, UA NONE SEEN NONE SEEN   Squamous Epithelial / HPF 0-5 0 - 5 /HPF   Mucus PRESENT    Hyaline Casts, UA PRESENT     Comment: Performed at [facility], [address] [place], [place] [address]  Lactic acid, plasma     Status: None   Collection Time: 05/08/23  8:32 PM  Result  Value Ref Range   Lactic Acid, Venous 1.3 0.5 - 1.9 mmol/L    Comment: Performed at [facility], [address], [place], [place] [address]  Comprehensive metabolic panel     Status: Abnormal   Collection Time: 05/08/23  8:32 PM  Result Value Ref Range   Sodium 136 135 - 145 mmol/L   Potassium 4.0 3.5 - 5.1 mmol/L   Chloride 103 98 - 111 mmol/L   CO2 26 22 - 32 mmol/L   Glucose, Bld 127 (H) 70 - 99 mg/dL    Comment: Glucose reference range applies only to samples taken after fasting for at least 8 hours.   BUN 14 8 - 23 mg/dL   Creatinine, Ser 2.95 0.44 - 1.00 mg/dL   Calcium 62.1 8.9 - 30.8 mg/dL   Total Protein 7.2 6.5 - 8.1 g/dL   Albumin 4.6 3.5 - 5.0 g/dL   AST 29 15 - 41 U/L   ALT 34 0 - 44 U/L   Alkaline Phosphatase 72 38 - 126 U/L   Total Bilirubin 1.0 0.3 - 1.2 mg/dL   GFR, Estimated >65 >78 mL/min    Comment: (NOTE) Calculated using the CKD-EPI Creatinine Equation (2021)    Anion gap 7 5 - 15    Comment: Performed at [facility], [address], [place], [place] [address]  CBC with Differential  Status: Abnormal   Collection Time: 05/08/23  8:32 PM  Result Value Ref Range   WBC 10.6 (H) 4.0 - 10.5 K/uL   RBC 3.77 (L) 3.87 - 5.11 MIL/uL   Hemoglobin 12.2 12.0 - 15.0 g/dL   HCT 14.7 (L) 82.9 - 56.2 %   MCV 93.6 80.0 - 100.0 fL   MCH 32.4 26.0 - 34.0 pg   MCHC 34.6 30.0 - 36.0 g/dL   RDW 13.0 86.5 - 78.4 %   Platelets 218 150 - 400 K/uL   nRBC 0.0 0.0 - 0.2 %   Neutrophils Relative % 84 %   Neutro Abs 8.8 (H) 1.7 - 7.7 K/uL   Lymphocytes Relative 10 %   Lymphs Abs 1.0 0.7 - 4.0 K/uL   Monocytes Relative 6 %   Monocytes Absolute 0.6 0.1 - 1.0 K/uL   Eosinophils Relative 0 %   Eosinophils Absolute 0.0 0.0 - 0.5 K/uL   Basophils Relative 0 %   Basophils Absolute 0.0 0.0 - 0.1 K/uL   Immature Granulocytes 0 %   Abs Immature Granulocytes 0.04 0.00 - 0.07 K/uL    Comment: Performed at [facility], [address], [place], [place] [address]   CT ABDOMEN PELVIS W CONTRAST  Result Date: 05/08/2023 CLINICAL DATA:  Abdominal pain, acute, nonlocalized Lower abdomen pain that started a few hours PTA with some nausea. EXAM: CT ABDOMEN AND PELVIS WITH CONTRAST TECHNIQUE: Multidetector CT imaging of the abdomen and pelvis was performed using the standard protocol following bolus administration of intravenous contrast. RADIATION DOSE REDUCTION: This exam was performed according to the departmental dose-optimization program which includes automated exposure control, adjustment of the mA and/or kV according to patient size and/or use of iterative reconstruction technique. CONTRAST:  OMNIPAQUE IOHEXOL 300 MG/ML  SOLN COMPARISON:  CT abdomen pelvis 12/15/2020 FINDINGS: Lower chest: No acute abnormality. Hepatobiliary: Subcentimeter hypodensity too small to characterize. No gallstones, gallbladder wall thickening, or pericholecystic fluid. No biliary dilatation. Pancreas: No focal lesion. Normal pancreatic contour. No surrounding inflammatory changes. No main pancreatic ductal dilatation. Spleen: Normal in size without focal abnormality. Adrenals/Urinary Tract: No adrenal nodule bilaterally. Bilateral kidneys enhance symmetrically. No hydronephrosis. No hydroureter. The urinary bladder is unremarkable. On delayed imaging, there is no urothelial wall thickening and there are no filling defects in the opacified portions of the bilateral collecting systems or ureters. Stomach/Bowel: Stomach is within normal limits. No evidence of bowel wall thickening or dilatation. The appendix is enlarged distally in caliber measuring up to 1 cm. Associated appendicoliths within the proximal and mid appendiceal lumen. Appendiceal wall thickening with no discontinuity. Periappendiceal fat stranding. Vascular/Lymphatic: No abdominal aorta or iliac aneurysm. Mild atherosclerotic plaque of the aorta and its branches. No abdominal,  pelvic, or inguinal lymphadenopathy. Reproductive: Status post hysterectomy. No adnexal masses. Other: No intraperitoneal free fluid. No intraperitoneal free gas. No organized fluid collection. Musculoskeletal: No abdominal wall hernia or abnormality. No suspicious lytic or blastic osseous lesions. No acute displaced fracture. Multilevel degenerative changes of the spine. 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 IMPRESSION: 1. Non-perforated acute appendicitis.  Associated 2 appendicoliths. 2. 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 3.  Aortic Atherosclerosis (ICD10-I70.0). Electronically Signed   By: Tish Frederickson M.D.   On: 05/08/2023 23:38   DG Chest Port 1 View  Result Date: 05/08/2023 CLINICAL DATA:  Hypotension. Generalized abdominal pain beginning yesterday. Nausea. EXAM: PORTABLE CHEST 1 VIEW COMPARISON:  12/15/2020 FINDINGS: Heart size and pulmonary vascularity are normal. Lungs are  clear. No pleural effusions. No pneumothorax. Mediastinal contours appear intact. Postoperative changes in the cervical spine. Degenerative changes in the thoracic spine and shoulders. IMPRESSION: No active disease. Electronically Signed   By: Burman Nieves M.D.   On: 05/08/2023 22:55      Assessment/Plan Acute appendicitis  - Patient with clinical and radiographic findings consistent with acute appendicitis. No signs of abscess or perforation on CT scan. I have discussed the procedure and risks of appendectomy. The risks include but are not limited to bleeding, infection, wound problems, anesthesia, injury to intra-abdominal organs, possibility of postoperative ileus. We also discussed that if she is found to have perforation during surgery she will likely need admission for IV antibiotics and is higher risk of developing an ileus or abscess. She seems to understand and agrees with the plan. She has received IV rocephin/flagyl. Keep NPO. Plan for surgery later  today.     I reviewed ED provider  notes, last 24 h vitals and pain scores, last 24 h labs and trends, and last 24 h imaging results.   Franne Forts, PA-C [facility] 05/09/2023, 10:35 AM Please see Amion for pager number during day hours 7:00am-4:30pm

## 2023-05-09 NOTE — Plan of Care (Signed)

## 2023-05-09 NOTE — Op Note (Signed)
Preoperative diagnosis: Acute appendicitis Postoperative diagnosis: Acute appendicitis with mesenteric abscess Procedure: Laparoscopic appendectomy Surgeon: Dr. Harden Mo Anesthesia: General Estimated blood loss: 20 cc Complications: None Drains: None Specimens: Appendix to pathology Sponge needle count was correct at completion Disposition to recovery stable condition  Indications: This a 72 year old female who began having general abdominal pain about 36-hour ago now localized to her right lower quadrant.  Associate with nausea and a temperature of 101.  She was evaluated by the emergency room and found to have nonperforated appendicitis CT scan.  She was transferred here for surgery.  We discussed proceeding with a laparoscopic appendectomy.  Procedure: After informed consent was obtained she was taken to the operating room.  She was already on antibiotics.  SCDs were in place.  She was placed under general anesthesia without complication.  A Foley catheter was placed.  She was prepped and draped in a standard sterile surgical fashion.  A surgical timeout was then performed.  Infiltrated Marcaine below her umbilicus and made a vertical incision.  I grasped the fascia and incised it sharply.  I then placed a 0 Vicryl pursestring suture through the fascia.  I inserted a Hassan trocar and insufflated the abdomen to 15 mmHg pressure.  I then placed 2 additional 5 mm trocars in the suprapubic region and left lower quadrant under direct vision.  Her cecum was scarred to her anterior abdominal wall with a combination of blunt dissection as well as the harmonic scalpel I took this down.  I then rolled the cecum medially after taking down the white line.  I was able to identify the terminal ileum and the cecum.  The appendix was coming off of the cecum and heading retrocecal.  Eventually I was able to traced this to the end.  Distally there was a small mesenteric abscess.  The base was clean.  I then  was able to dissect this from the around surrounding structures.  I then divided the mesentery with the harmonic scalpel.  I used the GIA stapler with a blue load to come across the base.  The appendix was placed in retrieval bag and removed from the abdomen.  I evaluated the small bowel as well as the cecum and there was no evidence of any injury.  Hemostasis was observed.  I then irrigated and removed some of the fluid that was there.  I then removed the [facility] trocar.  I tied my pursestring down.  I placed an additional 0 Vicryl suture to completely obliterate that defect.  I then remove the remaining trocars and desufflated the abdomen.  These were closed with 4-0 Monocryl and glue.  She tolerated this well was extubated and transferred recovery stable.

## 2023-05-09 NOTE — Anesthesia Preprocedure Evaluation (Signed)
Anesthesia Evaluation  Patient identified by MRN, date of birth, ID band Patient awake    Reviewed: Allergy & Precautions, H&P , NPO status , Patient's Chart, lab work & pertinent test results  History of Anesthesia Complications (+) DIFFICULT AIRWAY and history of anesthetic complications  Airway Mallampati: IV  TM Distance: <3 FB Neck ROM: Limited  Mouth opening: Limited Mouth Opening  Dental no notable dental hx.    Pulmonary neg pulmonary ROS, former smoker   Pulmonary exam normal breath sounds clear to auscultation       Cardiovascular negative cardio ROS Normal cardiovascular exam Rhythm:Regular Rate:Normal     Neuro/Psych negative neurological ROS  negative psych ROS   GI/Hepatic Neg liver ROS,GERD  ,,  Endo/Other  negative endocrine ROS    Renal/GU negative Renal ROS  negative genitourinary   Musculoskeletal negative musculoskeletal ROS (+)    Abdominal   Peds negative pediatric ROS (+)  Hematology negative hematology ROS (+)   Anesthesia Other Findings   Reproductive/Obstetrics negative OB ROS                             Anesthesia Physical Anesthesia Plan  ASA: 2  Anesthesia Plan: General   Post-op Pain Management: Tylenol PO (pre-op)*   Induction: Intravenous  PONV Risk Score and Plan: 3 and Ondansetron, Dexamethasone and Treatment may vary due to age or medical condition  Airway Management Planned: Oral ETT and Video Laryngoscope Planned  Additional Equipment:   Intra-op Plan:   Post-operative Plan: Extubation in OR  Informed Consent: I have reviewed the patients History and Physical, chart, labs and discussed the procedure including the risks, benefits and alternatives for the proposed anesthesia with the patient or authorized representative who has indicated his/her understanding and acceptance.     Dental advisory given  Plan Discussed with: CRNA and  Surgeon  Anesthesia Plan Comments:        Anesthesia Quick Evaluation

## 2023-05-09 NOTE — Anesthesia Procedure Notes (Addendum)
Procedure Name: Intubation Date/Time: 05/09/2023 11:57 AM  Performed by: April Holding, CRNAPre-anesthesia Checklist: Patient identified, Emergency Drugs available, Suction available and Patient being monitored Patient Re-evaluated:Patient Re-evaluated prior to induction Oxygen Delivery Method: Circle System Utilized Preoxygenation: Pre-oxygenation with 100% oxygen Induction Type: IV induction Ventilation: Mask ventilation without difficulty Laryngoscope Size: Glidescope and 3 Grade View: Grade II Tube type: Oral Number of attempts: 1 Airway Equipment and Method: Stylet and Oral airway Placement Confirmation: ETT inserted through vocal cords under direct vision, positive ETCO2 and breath sounds checked- equal and bilateral Secured at: 22 cm Tube secured with: Tape Dental Injury: Teeth and Oropharynx as per pre-operative assessment  Difficulty Due To: Difficulty was anticipated, Difficult Airway- due to anterior larynx, Difficult Airway- due to reduced neck mobility and Difficult Airway- due to limited oral opening

## 2023-05-09 NOTE — Transfer of Care (Signed)
Immediate Anesthesia Transfer of Care Note  Patient: Tonya Pope  Procedure(s) Performed: APPENDECTOMY LAPAROSCOPIC  Patient Location: PACU  Anesthesia Type:General  Level of Consciousness: awake, alert , and oriented  Airway & Oxygen Therapy: Patient Spontanous Breathing and Patient connected to face mask oxygen  Post-op Assessment: Report given to RN and Post -op Vital signs reviewed and stable  Post vital signs: Reviewed and stable  Last Vitals:  Vitals Value Taken Time  BP 127/64 05/09/23 1303  Temp    Pulse 97 05/09/23 1307  Resp 18 05/09/23 1307  SpO2 100 % 05/09/23 1307  Vitals shown include unfiled device data.  Last Pain:  Vitals:   05/09/23 1007  TempSrc:   PainSc: 6       Patients Stated Pain Goal: 1 (05/09/23 1007)  Complications:  Encounter Notable Events  Notable Event Outcome Phase Comment  Difficult to intubate - expected  Intraprocedure Filed from anesthesia note documentation.

## 2023-05-09 NOTE — ED Notes (Signed)
Patient placed on 2L Combine O2 for O2 saturation of 89% on RA. Patient denies any shortness of breath, chest pain. Patient was sleeping upon entering the room.

## 2023-05-09 NOTE — ED Provider Notes (Signed)
  Physical Exam  BP (!) 115/52   Pulse 92   Temp 99.2 F (37.3 C) (Oral)   Resp 18   SpO2 98%   Physical Exam Vitals and nursing note reviewed.  Constitutional:      Appearance: She is well-developed.  HENT:     Head: Normocephalic and atraumatic.  Abdominal:     Tenderness: There is abdominal tenderness in the right lower quadrant and suprapubic area. There is no right CVA tenderness, left CVA tenderness, guarding or rebound.  Neurological:     Mental Status: She is alert.     Procedures  Procedures  ED Course / MDM    Medical Decision Making Amount and/or Complexity of Data Reviewed Labs: ordered. Radiology: ordered.  Risk Prescription drug management.   Patient presenting here with complaints of abdominal pain as described in the HPI.  Patient initially seen by Dr. Andria Meuse, then care signed out to me awaiting results of a CT scan of the abdomen and pelvis.  This study has returned and is positive for appendicitis.  Patient has been given Rocephin and Flagyl along with medicine for pain and nausea.  I have spoken with Dr. Sheliah Hatch from general surgery.  Patient will remain in the ER here overnight, then go to the operating room in the morning.       Geoffery Lyons, MD 05/09/23 [phone]

## 2023-05-09 NOTE — Anesthesia Postprocedure Evaluation (Signed)
Anesthesia Post Note  Patient: Tonya Pope  Procedure(s) Performed: APPENDECTOMY LAPAROSCOPIC (Abdomen)     Patient location during evaluation: PACU Anesthesia Type: General Level of consciousness: awake and alert, oriented and patient cooperative Pain management: pain level controlled Vital Signs Assessment: post-procedure vital signs reviewed and stable Respiratory status: spontaneous breathing, nonlabored ventilation and respiratory function stable Cardiovascular status: blood pressure returned to baseline and stable Postop Assessment: no apparent nausea or vomiting Anesthetic complications: yes   Encounter Notable Events  Notable Event Outcome Phase Comment  Difficult to intubate - expected  Intraprocedure Filed from anesthesia note documentation.    Last Vitals:  Vitals:   05/09/23 1430 05/09/23 1452  BP: (!) 117/47 (!) 121/52  Pulse: 88 83  Resp: 15 (!) 22  Temp: 37.4 C 36.7 C  SpO2: 98% 98%    Last Pain:  Vitals:   05/09/23 1522  TempSrc:   PainSc: 6    Pain Goal: Patients Stated Pain Goal: 2 (05/09/23 1457)                 Lannie Fields

## 2023-05-09 NOTE — ED Notes (Signed)
Dr. Judd Lien made aware of current vital signs. No further orders at this time. Patient denies any current needs. Patient is resting on stretcher in room 8.

## 2023-05-09 NOTE — Discharge Instructions (Signed)

## 2023-05-09 NOTE — Progress Notes (Signed)
Received patient from PACU to room 6N12. Patient awake and alert, oriented x4. Vitals measured. Orders reviewed, plan of care discussed. Skin assessed. Trocar sites C/D/I with steri strips. Safety measures implemented. Patient requested prn for pain, Oxycodone 10 mg administered PO. Patient began choking, became apneic, grabbing her chest and turning red. Staff arrived to assist. Episode lastly less than 2 minutes and patient recovered. Changed order to NPO. During nursing admission assessment, patient reported occasional dysphagia d/t PSH of a neck fusion. Provider notified and followed up at bedside.

## 2023-05-09 NOTE — Progress Notes (Signed)
Called about appendicitis. She has received antibiotics. Plan for surgery today, will organize transfer after shift change. Continue pain control

## 2023-05-10 ENCOUNTER — Inpatient Hospital Stay (HOSPITAL_COMMUNITY): Payer: Medicare Other

## 2023-05-10 ENCOUNTER — Encounter (HOSPITAL_COMMUNITY): Payer: Self-pay | Admitting: General Surgery

## 2023-05-10 LAB — BASIC METABOLIC PANEL
Anion gap: 8 (ref 5–15)
BUN: 12 mg/dL (ref 8–23)
CO2: 24 mmol/L (ref 22–32)
Calcium: 8.5 mg/dL — ABNORMAL LOW (ref 8.9–10.3)
Chloride: 106 mmol/L (ref 98–111)
Creatinine, Ser: 1.03 mg/dL — ABNORMAL HIGH (ref 0.44–1.00)
GFR, Estimated: 58 mL/min — ABNORMAL LOW (ref >60)
Glucose, Bld: 116 mg/dL — ABNORMAL HIGH (ref 70–99)
Potassium: 3.8 mmol/L (ref 3.5–5.1)
Sodium: 138 mmol/L (ref 135–145)

## 2023-05-10 LAB — CBC
HCT: 29.1 % — ABNORMAL LOW (ref 36.0–46.0)
Hemoglobin: 9.8 g/dL — ABNORMAL LOW (ref 12.0–15.0)
MCH: 31.9 pg (ref 26.0–34.0)
MCHC: 33.7 g/dL (ref 30.0–36.0)
MCV: 94.8 fL (ref 80.0–100.0)
Platelets: 161 10*3/uL (ref 150–400)
RBC: 3.07 MIL/uL — ABNORMAL LOW (ref 3.87–5.11)
RDW: 13.9 % (ref 11.5–15.5)
WBC: 10.5 10*3/uL (ref 4.0–10.5)
nRBC: 0 % (ref 0.0–0.2)

## 2023-05-10 LAB — SURGICAL PATHOLOGY

## 2023-05-10 MED ORDER — ACETAMINOPHEN 10 MG/ML IV SOLN
1000.0000 mg | Freq: Four times a day (QID) | INTRAVENOUS | Status: AC
  Administered 2023-05-10 – 2023-05-11 (×3): 1000 mg via INTRAVENOUS
  Filled 2023-05-10 (×3): qty 100

## 2023-05-10 MED ORDER — LIDOCAINE 5 % EX PTCH
2.0000 | MEDICATED_PATCH | CUTANEOUS | Status: DC
  Administered 2023-05-10 – 2023-05-12 (×3): 2 via TRANSDERMAL
  Filled 2023-05-10 (×3): qty 2

## 2023-05-10 MED ORDER — METHOCARBAMOL 500 MG PO TABS
500.0000 mg | ORAL_TABLET | Freq: Four times a day (QID) | ORAL | Status: DC
  Administered 2023-05-10 – 2023-05-12 (×5): 500 mg via ORAL
  Filled 2023-05-10 (×7): qty 1

## 2023-05-10 MED ORDER — OXYCODONE HCL 5 MG PO TABS
5.0000 mg | ORAL_TABLET | ORAL | Status: DC | PRN
  Administered 2023-05-11: 5 mg via ORAL
  Filled 2023-05-10: qty 1

## 2023-05-10 MED ORDER — ACETAMINOPHEN 500 MG PO TABS
1000.0000 mg | ORAL_TABLET | Freq: Four times a day (QID) | ORAL | Status: DC
  Administered 2023-05-10: 1000 mg via ORAL
  Filled 2023-05-10: qty 2

## 2023-05-10 MED ORDER — FAMOTIDINE 20 MG PO TABS
20.0000 mg | ORAL_TABLET | Freq: Every day | ORAL | Status: DC
  Administered 2023-05-11 – 2023-05-12 (×2): 20 mg via ORAL
  Filled 2023-05-10 (×2): qty 1

## 2023-05-10 MED ORDER — DOCUSATE SODIUM 100 MG PO CAPS
100.0000 mg | ORAL_CAPSULE | Freq: Two times a day (BID) | ORAL | Status: DC
  Administered 2023-05-10 – 2023-05-12 (×4): 100 mg via ORAL
  Filled 2023-05-10 (×4): qty 1

## 2023-05-10 MED ORDER — METRONIDAZOLE 500 MG PO TABS
500.0000 mg | ORAL_TABLET | Freq: Two times a day (BID) | ORAL | Status: DC
  Administered 2023-05-10 – 2023-05-12 (×4): 500 mg via ORAL
  Filled 2023-05-10 (×4): qty 1

## 2023-05-10 NOTE — Plan of Care (Signed)

## 2023-05-10 NOTE — Progress Notes (Signed)
[facility] Progress Note  1 Day Post-Op  Subjective: CC:  Reports abdominal soreness. Feels some rumbling but has not had flatus. Reports some nausea this AM but no vomiting. Reports a little trouble swallowing pills at 0400 but did not have a severe choking episode.   Objective: Vital signs in last 24 hours: Temp:  [98.1 F (36.7 C)-99.5 F (37.5 C)] 98.2 F (36.8 C) (10/23 0734) Pulse Rate:  [83-103] 93 (10/23 0734) Resp:  [14-23] 16 (10/23 0734) BP: (94-135)/(44-64) 112/54 (10/23 0734) SpO2:  [95 %-99 %] 96 % (10/23 0734) Weight:  [57.6 kg] 57.6 kg (10/22 1456)    Intake/Output from previous day: 10/22 0701 - 10/23 0700 In: 2575.4 [P.O.:240; I.V.:2047.5; IV Piggyback:287.9] Out: 30 [Blood:30] Intake/Output this shift: No intake/output data recorded.  PE: Gen:  Alert, laying in bed holding emesis bag, no acute distress  Card:  Regular rate and rhythm,  Pulm:  Normal effort ORA Abd: Soft, mild distention but her abdomen is not tense or protuberant,  incisions C/D/I Skin: warm and dry, no rashes  Psych: A&Ox3   Lab Results:  Recent Labs    05/08/23 2032 05/10/23 0507  WBC 10.6* 10.5  HGB 12.2 9.8*  HCT 35.3* 29.1*  PLT 218 161   BMET Recent Labs    05/08/23 2032 05/10/23 0507  NA 136 138  K 4.0 3.8  CL 103 106  CO2 26 24  GLUCOSE 127* 116*  BUN 14 12  CREATININE 0.94 1.03*  CALCIUM 10.0 8.5*   PT/INR No results for input(s): "LABPROT", "INR" in the last 72 hours. CMP     Component Value Date/Time   NA 138 05/10/2023 0507   NA 141 07/01/2021 1458   K 3.8 05/10/2023 0507   CL 106 05/10/2023 0507   CO2 24 05/10/2023 0507   GLUCOSE 116 (H) 05/10/2023 0507   BUN 12 05/10/2023 0507   BUN 18 07/01/2021 1458   CREATININE 1.03 (H) 05/10/2023 0507   CALCIUM 8.5 (L) 05/10/2023 0507   PROT 7.2 05/08/2023 2032   ALBUMIN 4.6 05/08/2023 2032   AST 29 05/08/2023 2032   ALT 34 05/08/2023 2032   ALKPHOS 72 05/08/2023 2032   BILITOT 1.0  05/08/2023 2032   GFRNONAA 58 (L) 05/10/2023 0507   GFRAA 50 (L) 01/04/2017 1508   Lipase  No results found for: "LIPASE"     Studies/Results: CT ABDOMEN PELVIS W CONTRAST  Result Date: 05/08/2023 CLINICAL DATA:  Abdominal pain, acute, nonlocalized Lower abdomen pain that started a few hours PTA with some nausea. EXAM: CT ABDOMEN AND PELVIS WITH CONTRAST TECHNIQUE: Multidetector CT imaging of the abdomen and pelvis was performed using the standard protocol following bolus administration of intravenous contrast. RADIATION DOSE REDUCTION: This exam was performed according to the departmental dose-optimization program which includes automated exposure control, adjustment of the mA and/or kV according to patient size and/or use of iterative reconstruction technique. CONTRAST:  OMNIPAQUE IOHEXOL 300 MG/ML  SOLN COMPARISON:  CT abdomen pelvis 12/15/2020 FINDINGS: Lower chest: No acute abnormality. Hepatobiliary: Subcentimeter hypodensity too small to characterize. No gallstones, gallbladder wall thickening, or pericholecystic fluid. No biliary dilatation. Pancreas: No focal lesion. Normal pancreatic contour. No surrounding inflammatory changes. No main pancreatic ductal dilatation. Spleen: Normal in size without focal abnormality. Adrenals/Urinary Tract: No adrenal nodule bilaterally. Bilateral kidneys enhance symmetrically. No hydronephrosis. No hydroureter. The urinary bladder is unremarkable. On delayed imaging, there is no urothelial wall thickening and there are no filling defects in the opacified portions  of the bilateral collecting systems or ureters. Stomach/Bowel: Stomach is within normal limits. No evidence of bowel wall thickening or dilatation. The appendix is enlarged distally in caliber measuring up to 1 cm. Associated appendicoliths within the proximal and mid appendiceal lumen. Appendiceal wall thickening with no discontinuity. Periappendiceal fat stranding. Vascular/Lymphatic: No  abdominal aorta or iliac aneurysm. Mild atherosclerotic plaque of the aorta and its branches. No abdominal, pelvic, or inguinal lymphadenopathy. Reproductive: Status post hysterectomy. No adnexal masses. Other: No intraperitoneal free fluid. No intraperitoneal free gas. No organized fluid collection. Musculoskeletal: No abdominal wall hernia or abnormality. No suspicious lytic or blastic osseous lesions. No acute displaced fracture. Multilevel degenerative changes of the spine. 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 IMPRESSION: 1. Non-perforated acute appendicitis.  Associated 2 appendicoliths. 2. 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 3.  Aortic Atherosclerosis (ICD10-I70.0). Electronically Signed   By: Tish Frederickson M.D.   On: 05/08/2023 23:38   DG Chest Port 1 View  Result Date: 05/08/2023 CLINICAL DATA:  Hypotension. Generalized abdominal pain beginning yesterday. Nausea. EXAM: PORTABLE CHEST 1 VIEW COMPARISON:  12/15/2020 FINDINGS: Heart size and pulmonary vascularity are normal. Lungs are clear. No pleural effusions. No pneumothorax. Mediastinal contours appear intact. Postoperative changes in the cervical spine. Degenerative changes in the thoracic spine and shoulders. IMPRESSION: No active disease. Electronically Signed   By: Burman Nieves M.D.   On: 05/08/2023 22:55    Anti-infectives: Anti-infectives (From admission, onward)    Start     Dose/Rate Route Frequency Ordered Stop   05/09/23 1600  cefTRIAXone (ROCEPHIN) 2 g in sodium chloride 0.9 % 100 mL IVPB       Placed in "And" Linked Group   2 g 200 mL/hr over 30 Minutes Intravenous Every 24 hours 05/09/23 1451 05/16/23 1559   05/09/23 1600  metroNIDAZOLE (FLAGYL) IVPB 500 mg       Placed in "And" Linked Group   500 mg 100 mL/hr over 60 Minutes Intravenous Every 12 hours 05/09/23 1451 05/16/23 1559   05/09/23 0015  cefTRIAXone (ROCEPHIN) 1 g in sodium chloride 0.9 % 100 mL IVPB        1 g 200 mL/hr over 30 Minutes  Intravenous  Once 05/09/23 0000 05/09/23 0115   05/09/23 0015  metroNIDAZOLE (FLAGYL) IVPB 500 mg        500 mg 100 mL/hr over 60 Minutes Intravenous  Once 05/09/23 0000 05/09/23 0139        Assessment/Plan  Acute appendicitis with mesenteric abscess  POD#1 s/p laparoscopic appendectomy 10/23 Dr. Dwain Sarna - AFVSS,  WBC 10.5, hgb 9.8 from 12.2  - add lidoderm patched and robaxin for pain control - continue IV abx - encourage OOB and IS   FEN: reg, IVF at 50 mL/hr given nausea and poor PO intake thus far  ID: Rocephin/flagyl 10/22 VTE: SCD's, lovenox  Foley: none Dispo: med-surg, IV abx, await bowel function. CBC in AM    LOS: 1 day   I reviewed nursing notes, last 24 h vitals and pain scores, last 48 h intake and output, last 24 h labs and trends, and last 24 h imaging results.  Hosie Spangle, PA-C [facility] Please see Amion for pager number during day hours 7:00am-4:30pm

## 2023-05-11 LAB — CBC
HCT: 28.9 % — ABNORMAL LOW (ref 36.0–46.0)
Hemoglobin: 9.8 g/dL — ABNORMAL LOW (ref 12.0–15.0)
MCH: 31.8 pg (ref 26.0–34.0)
MCHC: 33.9 g/dL (ref 30.0–36.0)
MCV: 93.8 fL (ref 80.0–100.0)
Platelets: 177 10*3/uL (ref 150–400)
RBC: 3.08 MIL/uL — ABNORMAL LOW (ref 3.87–5.11)
RDW: 13.8 % (ref 11.5–15.5)
WBC: 7.2 10*3/uL (ref 4.0–10.5)
nRBC: 0 % (ref 0.0–0.2)

## 2023-05-11 LAB — BASIC METABOLIC PANEL
Anion gap: 8 (ref 5–15)
BUN: 13 mg/dL (ref 8–23)
CO2: 25 mmol/L (ref 22–32)
Calcium: 8.6 mg/dL — ABNORMAL LOW (ref 8.9–10.3)
Chloride: 105 mmol/L (ref 98–111)
Creatinine, Ser: 0.88 mg/dL (ref 0.44–1.00)
GFR, Estimated: >60 mL/min (ref >60)
Glucose, Bld: 112 mg/dL — ABNORMAL HIGH (ref 70–99)
Potassium: 3.2 mmol/L — ABNORMAL LOW (ref 3.5–5.1)
Sodium: 138 mmol/L (ref 135–145)

## 2023-05-11 MED ORDER — PROCHLORPERAZINE EDISYLATE 10 MG/2ML IJ SOLN
10.0000 mg | Freq: Four times a day (QID) | INTRAMUSCULAR | Status: AC | PRN
  Administered 2023-05-11: 10 mg via INTRAVENOUS
  Filled 2023-05-11: qty 2

## 2023-05-11 NOTE — Plan of Care (Signed)

## 2023-05-11 NOTE — Progress Notes (Signed)
   05/11/23 0942  Mobility  Activity Transferred from bed to chair  Level of Assistance Standby assist, set-up cues, supervision of patient - no hands on  Assistive Device None  Distance Ambulated (ft) 10 ft  Activity Response Tolerated well  Mobility Referral Yes  $Mobility charge 1 Mobility  Mobility Specialist Start Time (ACUTE ONLY) 0930  Mobility Specialist Stop Time (ACUTE ONLY) 0940  Mobility Specialist Time Calculation (min) (ACUTE ONLY) 10 min   Mobility Specialist: Progress Note  Pt agreeable to mobility session - received in bed. Required SB throughout with no AD. Pt with no complaints.  Returned to chair with all needs met - call bell within reach.   Barnie Mort, BS Mobility Specialist Please contact via SecureChat or Rehab office at [phone].

## 2023-05-11 NOTE — Progress Notes (Signed)
   05/11/23 1507  TOC Brief Assessment  Insurance and Status Reviewed  Patient has primary care physician Yes  Home environment has been reviewed Yes  Prior level of function: independent  Prior/Current Home Services No current home services  Social Determinants of Health Reivew SDOH reviewed no interventions necessary  Readmission risk has been reviewed Yes  Transition of care needs no transition of care needs at this time

## 2023-05-11 NOTE — Progress Notes (Signed)
[facility] Progress Note  2 Days Post-Op  Subjective: CC:  Overall doing better this morning. Pain rated 2/10 and not nauseated s/p compazine and oxy this morning. She reports flatus and denies vomiting. Reports little PO intake. Mobilizing.   Objective: Vital signs in last 24 hours: Temp:  [98 F (36.7 C)-98.3 F (36.8 C)] 98 F (36.7 C) (10/24 0923) Pulse Rate:  [71-87] 71 (10/24 0923) Resp:  [15-18] 15 (10/24 0923) BP: (104-131)/(49-69) 113/56 (10/24 0923) SpO2:  [93 %-96 %] 93 % (10/24 0923) Last BM Date : 05/09/23  Intake/Output from previous day: 10/23 0701 - 10/24 0700 In: 300 [IV Piggyback:300] Out: -  Intake/Output this shift: No intake/output data recorded.  PE: Gen:  Alert, laying in bed holding emesis bag, no acute distress  Card:  Regular rate and rhythm,  Pulm:  Normal effort ORA Abd: Soft, mild distention but her abdomen is not tense or protuberant,  incisions C/D/I Skin: warm and dry, no rashes  Psych: A&Ox3   Lab Results:  Recent Labs    05/08/23 2032 05/10/23 0507  WBC 10.6* 10.5  HGB 12.2 9.8*  HCT 35.3* 29.1*  PLT 218 161   BMET Recent Labs    05/08/23 2032 05/10/23 0507  NA 136 138  K 4.0 3.8  CL 103 106  CO2 26 24  GLUCOSE 127* 116*  BUN 14 12  CREATININE 0.94 1.03*  CALCIUM 10.0 8.5*   PT/INR No results for input(s): "LABPROT", "INR" in the last 72 hours. CMP     Component Value Date/Time   NA 138 05/10/2023 0507   NA 141 07/01/2021 1458   K 3.8 05/10/2023 0507   CL 106 05/10/2023 0507   CO2 24 05/10/2023 0507   GLUCOSE 116 (H) 05/10/2023 0507   BUN 12 05/10/2023 0507   BUN 18 07/01/2021 1458   CREATININE 1.03 (H) 05/10/2023 0507   CALCIUM 8.5 (L) 05/10/2023 0507   PROT 7.2 05/08/2023 2032   ALBUMIN 4.6 05/08/2023 2032   AST 29 05/08/2023 2032   ALT 34 05/08/2023 2032   ALKPHOS 72 05/08/2023 2032   BILITOT 1.0 05/08/2023 2032   GFRNONAA 58 (L) 05/10/2023 0507   GFRAA 50 (L) 01/04/2017 1508   Lipase   No results found for: "LIPASE"     Studies/Results: DG Abd Portable 1V  Result Date: 05/10/2023 CLINICAL DATA:  Vomiting.  Laparoscopic appendectomy yesterday. EXAM: PORTABLE ABDOMEN - 1 VIEW COMPARISON:  05/08/2023 CT FINDINGS: No bowel dilatation or evidence of obstruction. No evidence of ileus. Small volume formed stool in the colon. No radiopaque calculi. Right hip arthropathy again seen. IMPRESSION: Unremarkable abdominal radiographs, no evidence of obstruction or ileus. Electronically Signed   By: Narda Rutherford M.D.   On: 05/10/2023 16:25    Anti-infectives: Anti-infectives (From admission, onward)    Start     Dose/Rate Route Frequency Ordered Stop   05/10/23 2200  metroNIDAZOLE (FLAGYL) tablet 500 mg        500 mg Oral Every 12 hours 05/10/23 1420 05/16/23 0959   05/09/23 1600  cefTRIAXone (ROCEPHIN) 2 g in sodium chloride 0.9 % 100 mL IVPB       Placed in "And" Linked Group   2 g 200 mL/hr over 30 Minutes Intravenous Every 24 hours 05/09/23 1451 05/16/23 1559   05/09/23 1600  metroNIDAZOLE (FLAGYL) IVPB 500 mg  Status:  Discontinued       Placed in "And" Linked Group   500 mg 100 mL/hr over 60 Minutes  Intravenous Every 12 hours 05/09/23 1451 05/10/23 1420   05/09/23 0015  cefTRIAXone (ROCEPHIN) 1 g in sodium chloride 0.9 % 100 mL IVPB        1 g 200 mL/hr over 30 Minutes Intravenous  Once 05/09/23 0000 05/09/23 0115   05/09/23 0015  metroNIDAZOLE (FLAGYL) IVPB 500 mg        500 mg 100 mL/hr over 60 Minutes Intravenous  Once 05/09/23 0000 05/09/23 0139        Assessment/Plan  Acute appendicitis with mesenteric abscess  POD#2 s/p laparoscopic appendectomy 10/23 Dr. Dwain Sarna - AFVSS,  AM labs are pending  - seems to have a mild post-operative ileus. KUB looked ok yesterday and clinically improving with flatus. - continue IV abx - encourage OOB and IS   FEN: reg, SLIV ID: Rocephin/flagyl 10/22 VTE: SCD's, lovenox  Foley: none Dispo: med-surg, IV abx,  anticipate discharge home tomorrow 10/25 if nausea/vomiting resolved, tolerating PO, and continuing to have bowel function.    LOS: 2 days   I reviewed nursing notes, last 24 h vitals and pain scores, last 48 h intake and output, last 24 h labs and trends, and last 24 h imaging results.  Hosie Spangle, PA-C [facility] Please see Amion for pager number during day hours 7:00am-4:30pm

## 2023-05-11 NOTE — Progress Notes (Signed)
Pt wanted her colace changed from oral liquid to gel tab. Verbal order was given by the on call provider to this effect. Order completed. Also pt would like something in addition to Zofran for nausea. Provider was notified.

## 2023-05-11 NOTE — Progress Notes (Signed)
   05/11/23 1512  Mobility  Activity Ambulated independently in hallway  Level of Assistance Independent  Assistive Device None  Distance Ambulated (ft) 550 ft  Activity Response Tolerated fair  Mobility Referral Yes  $Mobility charge 1 Mobility  Mobility Specialist Start Time (ACUTE ONLY) 1502  Mobility Specialist Stop Time (ACUTE ONLY) 1510  Mobility Specialist Time Calculation (min) (ACUTE ONLY) 8 min   Mobility Specialist: Progress Note  Pt agreeable to mobility session - received in bed. Ambulated independently with no AD. C/o abd pain rated 3/10 . Returned to bed with all needs met - call bell within reach. Sister present  Barnie Mort, BS Mobility Specialist Please contact via SecureChat or Rehab office at [phone].

## 2023-05-12 ENCOUNTER — Other Ambulatory Visit (HOSPITAL_COMMUNITY): Payer: Self-pay

## 2023-05-12 MED ORDER — METRONIDAZOLE 500 MG PO TABS
500.0000 mg | ORAL_TABLET | Freq: Two times a day (BID) | ORAL | 0 refills | Status: AC
  Filled 2023-05-12: qty 6, 3d supply, fill #0

## 2023-05-12 MED ORDER — CIPROFLOXACIN HCL 500 MG PO TABS
500.0000 mg | ORAL_TABLET | Freq: Two times a day (BID) | ORAL | 0 refills | Status: AC
  Filled 2023-05-12: qty 6, 3d supply, fill #0

## 2023-05-12 MED ORDER — DOCUSATE SODIUM 100 MG PO CAPS
100.0000 mg | ORAL_CAPSULE | Freq: Two times a day (BID) | ORAL | 0 refills | Status: DC
  Filled 2023-05-12: qty 10, 5d supply, fill #0

## 2023-05-12 MED ORDER — POTASSIUM CHLORIDE 20 MEQ PO PACK
40.0000 meq | PACK | Freq: Once | ORAL | Status: AC
  Administered 2023-05-12: 40 meq via ORAL
  Filled 2023-05-12: qty 2

## 2023-05-12 MED ORDER — HYDROXYCHLOROQUINE SULFATE 200 MG PO TABS: 200.0000 mg | ORAL_TABLET | Freq: Every day | ORAL | Status: DC

## 2023-05-12 MED ORDER — OXYCODONE HCL 5 MG PO TABS
5.0000 mg | ORAL_TABLET | Freq: Four times a day (QID) | ORAL | 0 refills | Status: DC | PRN
  Filled 2023-05-12: qty 10, 3d supply, fill #0

## 2023-05-12 MED ORDER — METHOCARBAMOL 500 MG PO TABS
500.0000 mg | ORAL_TABLET | Freq: Four times a day (QID) | ORAL | 0 refills | Status: DC | PRN
  Filled 2023-05-12: qty 30, 8d supply, fill #0

## 2023-05-12 MED ORDER — ORAL CARE MOUTH RINSE: 15.0000 mL | OROMUCOSAL | Status: DC | PRN

## 2023-05-12 NOTE — Plan of Care (Signed)
  Problem: Education: Goal: Knowledge of General Education information will improve Description Including pain rating scale, medication(s)/side effects and non-pharmacologic comfort measures Outcome: Progressing   Problem: Clinical Measurements: Goal: Will remain free from infection Outcome: Progressing   Problem: Clinical Measurements: Goal: Diagnostic test results will improve Outcome: Progressing   Problem: Safety: Goal: Ability to remain free from injury will improve Outcome: Progressing

## 2023-05-12 NOTE — Progress Notes (Signed)
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

## 2023-05-12 NOTE — Discharge Summary (Signed)
[facility] Discharge Summary   Patient ID: Tonya Pope MRN: [id number] DOB/AGE: 1950-08-13 72 y.o.  Admit date: 05/08/2023 Discharge date: 05/12/2023  Admitting Diagnosis: Appendicitis   Discharge Diagnosis Patient Active Problem List   Diagnosis Date Noted   Acute appendicitis 05/09/2023   Pneumonia    OP (osteoporosis)    Hyperlipidemia    GERD (gastroesophageal reflux disease)    Difficult intubation    Diarrhea    Colon polyps    Arthritis    Anxiety    Anemia    Acne    Trigger index finger of left hand 12/02/2019   Bone spur of right foot 02/16/2018   Achilles tendinitis of right lower extremity 02/12/2018   Spinal stenosis, cervical region 01/13/2017   Carpal tunnel syndrome on left 10/31/2016   Cervical spondylosis without myelopathy 10/31/2016   Primary osteoarthritis of first carpometacarpal joint of left hand 10/31/2016   Medication reaction 12/26/2015   Abdominal pain, epigastric 03/27/2014   Panic attacks 02/17/2014   Osteoarthritis of right hip 10/31/2013   Mitral valve disease 08/03/2009   Mitral valve disease 08/03/2009   History of colonic polyps 05/08/2008   Depression 04/17/2008   GERD 03/28/2007   Hematuria 03/28/2007   Osteoporosis 03/28/2007   Insomnia 03/28/2007    Consultants None   Imaging: DG Abd Portable 1V  Result Date: 05/10/2023 CLINICAL DATA:  Vomiting.  Laparoscopic appendectomy yesterday. EXAM: PORTABLE ABDOMEN - 1 VIEW COMPARISON:  05/08/2023 CT FINDINGS: No bowel dilatation or evidence of obstruction. No evidence of ileus. Small volume formed stool in the colon. No radiopaque calculi. Right hip arthropathy again seen. IMPRESSION: Unremarkable abdominal radiographs, no evidence of obstruction or ileus. Electronically Signed   By: Narda Rutherford M.D.   On: 05/10/2023 16:25    Procedures  Dr. Dwain Sarna (05/10/23) - Laparoscopic Appendectomy  Hospital Course:  72 y/o F who presented with abdominal pain.  Workup showed appendiciits.  Patient was admitted and underwent procedure listed above where appendicitis with mesenteric abscess was appreciated. Tolerated procedure well and was transferred to the floor.  Diet was advanced as tolerated.  On POD#2, the patient was voiding well, tolerating diet, ambulating well, pain well controlled, vital signs stable, incisions c/d/i and felt stable for discharge home.  Patient will follow up in our office as below and knows to call with questions or concerns.    I have personally reviewed the patients medication history on the [place] controlled substance database.   Physical Exam: General:  Alert, NAD, pleasant, comfortable Abd:  Soft, ND, mild tenderness, incisions C/D/I   Allergies as of 05/12/2023       Reactions   Epinephrine Other (See Comments)   Pass out / dizzy   Nsaids Diarrhea   Promethazine Hcl Other (See Comments)   UNSPECIFIED CNS EFFECTS   Codeine Phosphate Nausea Only   Penicillins Rash        Medication List     TAKE these medications    ALPRAZolam 0.25 MG tablet Commonly known as: XANAX Take 0.25 mg by mouth daily as needed for anxiety.   Calcium/Vitamin D 600-400 MG-UNIT Tabs Take 1 tablet by mouth daily.   ciprofloxacin 500 MG tablet Commonly known as: Cipro Take 1 tablet (500 mg total) by mouth 2 (two) times daily for 3 days.   docusate sodium 100 MG capsule Commonly known as: COLACE Take 1 capsule (100 mg total) by mouth 2 (two) times daily.   hydroxychloroquine 200 MG tablet Commonly known as: PLAQUENIL  Take 1 tablet (200 mg total) by mouth daily. Start taking on: May 19, 2023 What changed: These instructions start on May 19, 2023. If you are unsure what to do until then, ask your doctor or other care provider.   hyoscyamine 0.125 MG Tbdp disintergrating tablet Commonly known as: ANASPAZ Place 0.125 mg under the tongue every 4 (four) hours as needed for bladder spasms.   latanoprost 0.005 % ophthalmic  solution Commonly known as: XALATAN Place 1 drop into both eyes at bedtime.   loperamide 2 MG tablet Commonly known as: IMODIUM A-D Take 4 mg by mouth as needed for diarrhea or loose stools.   methocarbamol 500 MG tablet Commonly known as: ROBAXIN Take 1 tablet (500 mg total) by mouth every 6 (six) hours as needed for muscle spasms.   metoprolol tartrate 25 MG tablet Commonly known as: LOPRESSOR Take 0.5 tablets (12.5 mg total) by mouth daily as needed.   metroNIDAZOLE 500 MG tablet Commonly known as: Flagyl Take 1 tablet (500 mg total) by mouth 2 (two) times daily with a meal for 3 days. DO NOT CONSUME ALCOHOL WHILE TAKING THIS MEDICATION.   oxyCODONE 5 MG immediate release tablet Commonly known as: Oxy IR/ROXICODONE Take 1 tablet (5 mg total) by mouth every 6 (six) hours as needed.   pantoprazole 40 MG tablet Commonly known as: PROTONIX Take 1 tablet (40 mg total) by mouth 2 (two) times daily.   saccharomyces boulardii 250 MG capsule Commonly known as: Florastor Take 1 capsule (250 mg total) by mouth 2 (two) times daily. For 2 weeks   tiZANidine 4 MG tablet Commonly known as: ZANAFLEX Take 4 mg by mouth daily as needed for muscle spasms.   venlafaxine XR 75 MG 24 hr capsule Commonly known as: EFFEXOR-XR Take 75 mg by mouth daily.   venlafaxine XR 37.5 MG 24 hr capsule Commonly known as: EFFEXOR-XR Take 37.5 mg by mouth daily.          Follow-up Information     Emelia Loron, MD. Go on 05/31/2023.   Specialty: General Surgery Why: Your appointment is 05/31/23 at 11:40am Arrive early to check in, fill out paperwork, Bring photo ID and insurance information Contact information: [address] [place] [address] [phone]                 Signed: Hosie Spangle, [facility] 05/12/2023, 12:02 PM

## 2023-09-16 HISTORY — PX: TOTAL HIP ARTHROPLASTY: SHX124

## 2023-09-29 ENCOUNTER — Emergency Department (HOSPITAL_BASED_OUTPATIENT_CLINIC_OR_DEPARTMENT_OTHER)

## 2023-09-29 ENCOUNTER — Emergency Department (HOSPITAL_BASED_OUTPATIENT_CLINIC_OR_DEPARTMENT_OTHER)
Admission: EM | Admit: 2023-09-29 | Discharge: 2023-09-29 | Disposition: A | Discharge status: Home / Self Care | Attending: Emergency Medicine | Admitting: Emergency Medicine

## 2023-09-29 ENCOUNTER — Other Ambulatory Visit: Payer: Self-pay

## 2023-09-29 ENCOUNTER — Encounter (HOSPITAL_BASED_OUTPATIENT_CLINIC_OR_DEPARTMENT_OTHER): Payer: Self-pay

## 2023-09-29 DIAGNOSIS — W19XXXA Unspecified fall, initial encounter: Secondary | ICD-10-CM | POA: Diagnosis not present

## 2023-09-29 DIAGNOSIS — S0101XA Laceration without foreign body of scalp, initial encounter: Secondary | ICD-10-CM | POA: Diagnosis not present

## 2023-09-29 DIAGNOSIS — S0990XA Unspecified injury of head, initial encounter: Secondary | ICD-10-CM | POA: Diagnosis present

## 2023-09-29 NOTE — ED Notes (Signed)
 Discharge instructions reviewed.   Opportunity for questions and concerns provided.   Alert, oriented and ambulatory.   Displays no signs of distress.   Pt has sister picking her up to provide a safe ride home.

## 2023-09-29 NOTE — ED Provider Notes (Signed)
 [place] EMERGENCY DEPARTMENT AT [facility] Provider Note   CSN: [id number] Arrival date & time: 09/29/23  0357     History  Chief Complaint  Patient presents with   Fall    Tonya Pope is a 73 y.o. female.  The history is provided by the patient.  Fall This is a new problem. The current episode started less than 1 hour ago. The problem occurs rarely. The problem has been resolved. Associated symptoms comments: Scalp laceration . Nothing aggravates the symptoms. Nothing relieves the symptoms. She has tried nothing for the symptoms. The treatment provided no relief.  Patient took Tizanidine and then drank a glass of vodka and then fell striking the back of her head.      Home Medications Prior to Admission medications   Medication Sig Start Date End Date Taking? Authorizing Provider  ALPRAZolam Prudy Feeler) 0.25 MG tablet Take 0.25 mg by mouth daily as needed for anxiety. 10/02/20   [provider]  Calcium Carb-Cholecalciferol (CALCIUM/VITAMIN D) 600-400 MG-UNIT TABS Take 1 tablet by mouth daily.    [provider]  docusate sodium (COLACE) 100 MG capsule Take 1 capsule (100 mg total) by mouth 2 (two) times daily. 05/12/23   Adam Phenix, PA-C  hydroxychloroquine (PLAQUENIL) 200 MG tablet Take 1 tablet (200 mg total) by mouth daily. 05/19/23   Adam Phenix, PA-C  hyoscyamine (ANASPAZ) 0.125 MG TBDP disintergrating tablet Place 0.125 mg under the tongue every 4 (four) hours as needed for bladder spasms. 02/22/23   [provider]  latanoprost (XALATAN) 0.005 % ophthalmic solution Place 1 drop into both eyes at bedtime. 02/05/20   [provider]  loperamide (IMODIUM A-D) 2 MG tablet Take 4 mg by mouth as needed for diarrhea or loose stools.    [provider]  methocarbamol (ROBAXIN) 500 MG tablet Take 1 tablet (500 mg total) by mouth every 6 (six) hours as needed for muscle spasms. 05/12/23   Adam Phenix,  PA-C  metoprolol tartrate (LOPRESSOR) 25 MG tablet Take 0.5 tablets (12.5 mg total) by mouth daily as needed. 07/01/21 05/09/23  Baldo Daub, MD  oxyCODONE (OXY IR/ROXICODONE) 5 MG immediate release tablet Take 1 tablet (5 mg total) by mouth every 6 (six) hours as needed. 05/12/23   Emelia Loron, MD  pantoprazole (PROTONIX) 40 MG tablet Take 1 tablet (40 mg total) by mouth 2 (two) times daily. Patient not taking: Reported on 05/09/2023 05/09/18 05/09/23  Shirline Frees, NP  saccharomyces boulardii (FLORASTOR) 250 MG capsule Take 1 capsule (250 mg total) by mouth 2 (two) times daily. For 2 weeks 04/24/17   Hilarie Fredrickson, MD  tiZANidine (ZANAFLEX) 4 MG tablet Take 4 mg by mouth daily as needed for muscle spasms. 03/28/20   [provider]  venlafaxine XR (EFFEXOR-XR) 37.5 MG 24 hr capsule Take 37.5 mg by mouth daily. 09/09/22   [provider]  venlafaxine XR (EFFEXOR-XR) 75 MG 24 hr capsule Take 75 mg by mouth daily. 03/06/20   [provider]      Allergies    Epinephrine, Nsaids, Promethazine hcl, Codeine phosphate, and Penicillins    Review of Systems   Review of Systems  Physical Exam Updated Vital Signs BP 107/66   Pulse 74   Temp 98.2 F (36.8 C)   Resp 18   Ht 5\' 1"  (1.549 m)   Wt 56.7 kg   SpO2 95%   BMI 23.62 kg/m  Physical Exam Vitals and nursing note  reviewed.  Constitutional:      General: She is not in acute distress.    Appearance: She is well-developed.  HENT:     Head: Normocephalic.      Nose: Nose normal.  Eyes:     Pupils: Pupils are equal, round, and reactive to light.  Cardiovascular:     Rate and Rhythm: Normal rate and regular rhythm.     Pulses: Normal pulses.     Heart sounds: Normal heart sounds.  Pulmonary:     Effort: Pulmonary effort is normal. No respiratory distress.     Breath sounds: Normal breath sounds.  Abdominal:     General: Bowel sounds are normal. There is no distension.     Palpations: Abdomen is  soft.     Tenderness: There is no abdominal tenderness. There is no guarding or rebound.  Musculoskeletal:        General: Normal range of motion.     Cervical back: Normal range of motion and neck supple. No rigidity.  Skin:    General: Skin is dry.     Capillary Refill: Capillary refill takes less than 2 seconds.     Findings: No erythema or rash.  Neurological:     General: No focal deficit present.     Deep Tendon Reflexes: Reflexes normal.  Psychiatric:        Mood and Affect: Mood normal.     ED Results / Procedures / Treatments   Labs (all labs ordered are listed, but only abnormal results are displayed) Labs Reviewed - No data to display  EKG None  Radiology CT Head Wo Contrast Result Date: 09/29/2023 CLINICAL DATA:  Blunt poly trauma. EXAM: CT HEAD WITHOUT CONTRAST CT CERVICAL SPINE WITHOUT CONTRAST TECHNIQUE: Multidetector CT imaging of the head and cervical spine was performed following the standard protocol without intravenous contrast. Multiplanar CT image reconstructions of the cervical spine were also generated. RADIATION DOSE REDUCTION: This exam was performed according to the departmental dose-optimization program which includes automated exposure control, adjustment of the mA and/or kV according to patient size and/or use of iterative reconstruction technique. COMPARISON:  None Available. FINDINGS: CT HEAD FINDINGS Brain: No evidence of acute infarction, hemorrhage, hydrocephalus, extra-axial collection or mass lesion/mass effect. Vascular: No hyperdense vessel or unexpected calcification. Skull: Posterior scalp staples.  No acute fracture. Sinuses/Orbits: No evidence of injury CT CERVICAL SPINE FINDINGS Alignment: Unremarkable Skull base and vertebrae: No acute fracture. No primary bone lesion or focal pathologic process. ACDF with solid arthrodesis spanning C4-C7. Bulky osteophyte with some fragmentation at C2 and C3 anteriorly. Soft tissues and spinal canal: No  prevertebral fluid or swelling. No visible canal hematoma. Disc levels: ACDF is noted above. Degenerative spurring at the upper cervical spine with especially bulky C3 osteophyte. Upper chest: Negative IMPRESSION: No evidence of acute intracranial or cervical spine injury. Electronically Signed   By: Tiburcio Pea M.D.   On: 09/29/2023 04:50   CT Cervical Spine Wo Contrast Result Date: 09/29/2023 CLINICAL DATA:  Blunt poly trauma. EXAM: CT HEAD WITHOUT CONTRAST CT CERVICAL SPINE WITHOUT CONTRAST TECHNIQUE: Multidetector CT imaging of the head and cervical spine was performed following the standard protocol without intravenous contrast. Multiplanar CT image reconstructions of the cervical spine were also generated. RADIATION DOSE REDUCTION: This exam was performed according to the departmental dose-optimization program which includes automated exposure control, adjustment of the mA and/or kV according to patient size and/or use of iterative reconstruction technique. COMPARISON:  None Available. FINDINGS: CT HEAD FINDINGS  Brain: No evidence of acute infarction, hemorrhage, hydrocephalus, extra-axial collection or mass lesion/mass effect. Vascular: No hyperdense vessel or unexpected calcification. Skull: Posterior scalp staples.  No acute fracture. Sinuses/Orbits: No evidence of injury CT CERVICAL SPINE FINDINGS Alignment: Unremarkable Skull base and vertebrae: No acute fracture. No primary bone lesion or focal pathologic process. ACDF with solid arthrodesis spanning C4-C7. Bulky osteophyte with some fragmentation at C2 and C3 anteriorly. Soft tissues and spinal canal: No prevertebral fluid or swelling. No visible canal hematoma. Disc levels: ACDF is noted above. Degenerative spurring at the upper cervical spine with especially bulky C3 osteophyte. Upper chest: Negative IMPRESSION: No evidence of acute intracranial or cervical spine injury. Electronically Signed   By: Tiburcio Pea M.D.   On: 09/29/2023 04:50     Procedures .Laceration Repair  Date/Time: 09/29/2023 5:19 AM  Performed by: Cy Blamer, MD Authorized by: Cy Blamer, MD   Consent:    Consent obtained:  Verbal   Consent given by:  Patient   Risks discussed:  Infection, need for additional repair, pain, poor cosmetic result and poor wound healing   Alternatives discussed:  No treatment Universal protocol:    Patient identity confirmed:  Arm band Anesthesia:    Anesthesia method:  None Laceration details:    Location: scalp.   Length (cm):  1.5   Depth (mm):  1 Pre-procedure details:    Preparation:  Patient was prepped and draped in usual sterile fashion Exploration:    Wound extent: fascia not violated, no foreign body and no signs of injury     Contaminated: no   Treatment:    Area cleansed with:  Povidone-iodine and chlorhexidine   Amount of cleaning:  Extensive   Debridement:  None Skin repair:    Repair method:  Staples   Number of staples:  3 Approximation:    Approximation:  Close Repair type:    Repair type:  Simple Post-procedure details:    Dressing:  Open (no dressing)   Procedure completion:  Tolerated well, no immediate complications     Medications Ordered in ED Medications - No data to display  ED Course/ Medical Decision Making/ A&P                                 Medical Decision Making Patient with fall this evening and scalp laceration   Amount and/or Complexity of Data Reviewed Independent Historian: EMS    Details: See above  External Data Reviewed: notes.    Details: Previous notes reviewed  Radiology: ordered and independent interpretation performed.    Details: Negative head CT  Risk Risk Details: CT head and C spine are negative.  Patient has been counseled against mixing alcohol with Zanaflex.  I have advised not drinking alcohol while taking opioids, benzos or muscle relaxants as this can induce somnolence and potentially hypotension and is very dangerous.  Patient  has been observed in the ED.  Stable for discharge.  Staple removal at urgent care in 5 days.  No submersion in any body of water for 14 days.      Final Clinical Impression(s) / ED Diagnoses Final diagnoses:  Laceration of scalp, initial encounter  Fall, initial encounter   No signs of systemic illness or infection. The patient is nontoxic-appearing on exam and vital signs are within normal limits.  I have reviewed the triage vital signs and the nursing notes. Pertinent labs & imaging results that were  available during my care of the patient were reviewed by me and considered in my medical decision making (see chart for details). After history, exam, and medical workup I feel the patient has been appropriately medically screened and is safe for discharge home. Pertinent diagnoses were discussed with the patient. Patient was given return precautions.  Rx / DC Orders ED Discharge Orders     None         Favio Moder, MD 09/29/23 [phone]

## 2023-09-29 NOTE — ED Triage Notes (Signed)
 Arrives [facility] from home after a fall striking head on ground. Denies LOC. Denies anticoagulants.   Cervical tenderness but says that is normal for her.   Bleeding controlled on arrival.   Had taken Tizanidine prior to  help with sleep.

## 2023-10-06 ENCOUNTER — Ambulatory Visit

## 2023-10-09 ENCOUNTER — Ambulatory Visit
Admission: RE | Admit: 2023-10-09 | Discharge: 2023-10-09 | Disposition: A | Discharge status: Home / Self Care | Source: Ambulatory Visit | Attending: Physician Assistant | Admitting: Physician Assistant

## 2023-10-09 VITALS — BP 95/51 | HR 82 | Temp 97.8°F | Resp 17

## 2023-10-09 DIAGNOSIS — S0101XD Laceration without foreign body of scalp, subsequent encounter: Secondary | ICD-10-CM

## 2023-10-09 NOTE — ED Provider Notes (Signed)
 [facility]    CSN: [id number] Arrival date & time: 10/09/23  1325      History   Chief Complaint Chief Complaint  Patient presents with   Suture / Staple Removal    Placed in ED    HPI Senta B Mongeau is a 73 y.o. female.   Patient here today for staple removal from her scalp.  She had staples placed 10 days ago after a fall.  She denies any headache, nausea, vomiting, lightheadedness or dizziness.  Blood pressure is noted to be somewhat low in office but patient reports this is normal for her.  The history is provided by the patient.  Suture / Staple Removal Pertinent negatives include no abdominal pain, no headaches and no shortness of breath.    Past Medical History:  Diagnosis Date   Abdominal pain, epigastric 03/27/2014   Achilles tendinitis of right lower extremity 02/12/2018   Acne    Anemia    Anxiety    Arthritis    Bone spur of right foot 02/16/2018   Formatting of this note might be different from the original. Added automatically from request for surgery [id number]   Carpal tunnel syndrome on left 10/31/2016   Cervical spondylosis without myelopathy 10/31/2016   Colon polyps    Depression    Diarrhea    C. Diff   Difficult intubation    patient stated "had to use a smaller tube" in previous surgery in 80's   GERD 03/28/2007   Qualifier: Diagnosis of  By: Tawanna Cooler RN, Alvino Chapel     GERD (gastroesophageal reflux disease)    Hematuria 03/28/2007   Qualifier: Diagnosis of  By: Everett Graff     History of colonic polyps 05/08/2008   Qualifier: Diagnosis of  By: Tawanna Cooler MD, Tinnie Gens A    Hyperlipidemia    Insomnia    Medication reaction 12/26/2015   Migraine    Mitral valve disease 08/03/2009   Formatting of this note might be different from the original. Overview:  Qualifier: Diagnosis of  By: Tawanna Cooler MD, Tinnie Gens A   MITRAL VALVE PROLAPSE 08/03/2009   Qualifier: Diagnosis of  By: Tawanna Cooler MD, Eugenio Hoes    OP (osteoporosis)    Osteoarthritis of right hip 10/31/2013    Osteoporosis 03/28/2007   Qualifier: Diagnosis of  By: Tawanna Cooler, RN, Moreen Fowler of this note might be different from the original. Overview:  Qualifier: Diagnosis of  By: Tawanna Cooler, RN, Alvino Chapel   Panic attacks 02/17/2014   Pneumonia    Primary osteoarthritis of first carpometacarpal joint of left hand 10/31/2016   Spinal stenosis, cervical region 01/13/2017   Trigger index finger of left hand 12/02/2019    Patient Active Problem List   Diagnosis Date Noted   Acute appendicitis 05/09/2023   Pneumonia    OP (osteoporosis)    Hyperlipidemia    GERD (gastroesophageal reflux disease)    Difficult intubation    Diarrhea    Colon polyps    Arthritis    Anxiety    Anemia    Acne    Trigger index finger of left hand 12/02/2019   Bone spur of right foot 02/16/2018   Achilles tendinitis of right lower extremity 02/12/2018   Spinal stenosis, cervical region 01/13/2017   Carpal tunnel syndrome on left 10/31/2016   Cervical spondylosis without myelopathy 10/31/2016   Primary osteoarthritis of first carpometacarpal joint of left hand 10/31/2016   Medication reaction 12/26/2015   Abdominal pain, epigastric 03/27/2014  Panic attacks 02/17/2014   Osteoarthritis of right hip 10/31/2013   Mitral valve disease 08/03/2009   Mitral valve disease 08/03/2009   History of colonic polyps 05/08/2008   Depression 04/17/2008   GERD 03/28/2007   Hematuria 03/28/2007   Osteoporosis 03/28/2007   Insomnia 03/28/2007    Past Surgical History:  Procedure Laterality Date   ABDOMINAL HYSTERECTOMY     ANTERIOR CERVICAL DECOMP/DISCECTOMY FUSION N/A 01/13/2017   Procedure: ACDF - C4-C5 - C5-C6;  Surgeon: Donalee Citrin, MD;  Location: [facility] OR;  Service: Neurosurgery;  Laterality: N/A;   CARPAL TUNNEL RELEASE Left 11/15/2016   Procedure: LEFT CARPAL TUNNEL RELEASE;  Surgeon: Cindee Salt, MD;  Location: [facility];  Service: Orthopedics;  Laterality: Left;   CARPAL TUNNEL RELEASE Right 08/18/2016    COLONOSCOPY W/ POLYPECTOMY     DIAGNOSTIC LAPAROSCOPY     for endometriosis   ENDOMETRIAL ABLATION     LAPAROSCOPIC APPENDECTOMY N/A 05/09/2023   Procedure: APPENDECTOMY LAPAROSCOPIC;  Surgeon: Emelia Loron, MD;  Location: [facility] OR;  Service: General;  Laterality: N/A;   TONSILLECTOMY     TONSILLECTOMY AND ADENOIDECTOMY      OB History   No obstetric history on file.      Home Medications    Prior to Admission medications   Medication Sig Start Date End Date Taking? Authorizing Provider  ALPRAZolam Prudy Feeler) 0.25 MG tablet Take 0.25 mg by mouth daily as needed for anxiety. 10/02/20   [provider]  Calcium Carb-Cholecalciferol (CALCIUM/VITAMIN D) 600-400 MG-UNIT TABS Take 1 tablet by mouth daily.    [provider]  docusate sodium (COLACE) 100 MG capsule Take 1 capsule (100 mg total) by mouth 2 (two) times daily. 05/12/23   Adam Phenix, PA-C  hydroxychloroquine (PLAQUENIL) 200 MG tablet Take 1 tablet (200 mg total) by mouth daily. 05/19/23   Adam Phenix, PA-C  hyoscyamine (ANASPAZ) 0.125 MG TBDP disintergrating tablet Place 0.125 mg under the tongue every 4 (four) hours as needed for bladder spasms. 02/22/23   [provider]  latanoprost (XALATAN) 0.005 % ophthalmic solution Place 1 drop into both eyes at bedtime. 02/05/20   [provider]  loperamide (IMODIUM A-D) 2 MG tablet Take 4 mg by mouth as needed for diarrhea or loose stools.    [provider]  methocarbamol (ROBAXIN) 500 MG tablet Take 1 tablet (500 mg total) by mouth every 6 (six) hours as needed for muscle spasms. 05/12/23   Adam Phenix, PA-C  metoprolol tartrate (LOPRESSOR) 25 MG tablet Take 0.5 tablets (12.5 mg total) by mouth daily as needed. 07/01/21 05/09/23  Baldo Daub, MD  oxyCODONE (OXY IR/ROXICODONE) 5 MG immediate release tablet Take 1 tablet (5 mg total) by mouth every 6 (six) hours as needed. 05/12/23   Emelia Loron, MD   pantoprazole (PROTONIX) 40 MG tablet Take 1 tablet (40 mg total) by mouth 2 (two) times daily. Patient not taking: Reported on 05/09/2023 05/09/18 05/09/23  Shirline Frees, NP  saccharomyces boulardii (FLORASTOR) 250 MG capsule Take 1 capsule (250 mg total) by mouth 2 (two) times daily. For 2 weeks 04/24/17   Hilarie Fredrickson, MD  tiZANidine (ZANAFLEX) 4 MG tablet Take 4 mg by mouth daily as needed for muscle spasms. 03/28/20   [provider]  venlafaxine XR (EFFEXOR-XR) 37.5 MG 24 hr capsule Take 37.5 mg by mouth daily. 09/09/22   [provider]  venlafaxine XR (EFFEXOR-XR) 75 MG 24 hr capsule Take 75 mg by mouth  daily. 03/06/20   [provider]    Family History Family History  Problem Relation Age of Onset   Stroke Mother    Arthritis Mother    Diabetes Mother    Heart disease Mother    Hypertension Mother    Hyperlipidemia Father    Glaucoma Father    Thyroid disease Sister    Breast cancer Maternal Aunt    Colon cancer Maternal Grandfather    Depression Sister    Colon cancer Paternal Grandmother     Social History Social History   Tobacco Use   Smoking status: Former   Smokeless tobacco: Never  Vaping Use   Vaping status: Never Used  Substance Use Topics   Alcohol use: Yes    Alcohol/week: 12.0 - 14.0 standard drinks of alcohol    Types: 12 - 14 Glasses of wine per week    Comment: 2 glasses of wine    Drug use: No     Allergies   Epinephrine, Nsaids, Promethazine hcl, Codeine phosphate, and Penicillins   Review of Systems Review of Systems  Constitutional:  Negative for chills and fever.  Eyes:  Negative for discharge and redness.  Respiratory:  Negative for shortness of breath.   Gastrointestinal:  Negative for abdominal pain, nausea and vomiting.  Skin:  Positive for wound. Negative for color change.  Neurological:  Negative for dizziness, light-headedness and headaches.     Physical Exam Triage Vital Signs ED Triage Vitals  [10/09/23 1333]  Encounter Vitals Group     BP (!) 92/49     Systolic BP Percentile      Diastolic BP Percentile      Pulse Rate 82     Resp 17     Temp 97.8 F (36.6 C)     Temp Source Oral     SpO2 94 %     Weight      Height      Head Circumference      Peak Flow      Pain Score      Pain Loc      Pain Education      Exclude from Growth Chart    No data found.  Updated Vital Signs BP (!) 95/51 (BP Location: Right Arm)   Pulse 82   Temp 97.8 F (36.6 C) (Oral)   Resp 17   SpO2 94%   Visual Acuity Right Eye Distance:   Left Eye Distance:   Bilateral Distance:    Right Eye Near:   Left Eye Near:    Bilateral Near:     Physical Exam Vitals and nursing note reviewed.  Constitutional:      General: She is not in acute distress.    Appearance: Normal appearance. She is not ill-appearing.  HENT:     Head: Normocephalic and atraumatic.  Eyes:     Conjunctiva/sclera: Conjunctivae normal.  Cardiovascular:     Rate and Rhythm: Normal rate.  Pulmonary:     Effort: Pulmonary effort is normal. No respiratory distress.  Skin:    Comments: 3 staples in place to healed wound to posterior scalp without erythema, swelling, drainage or bleeding  Neurological:     Mental Status: She is alert.  Psychiatric:        Mood and Affect: Mood normal.        Behavior: Behavior normal.        Thought Content: Thought content normal.      UC Treatments / Results  Labs (all labs ordered are listed, but only abnormal results are displayed) Labs Reviewed - No data to display  EKG   Radiology No results found.  Procedures Procedures (including critical care time)  Medications Ordered in UC Medications - No data to display  Initial Impression / Assessment and Plan / UC Course  I have reviewed the triage vital signs and the nursing notes.  Pertinent labs & imaging results that were available during my care of the patient were reviewed by me and considered in my medical  decision making (see chart for details).    Sutures removed by nursing staff without complication.  Advised follow-up with any concerns.   Final Clinical Impressions(s) / UC Diagnoses   Final diagnoses:  Scalp laceration, subsequent encounter   Discharge Instructions   None    ED Prescriptions   None    PDMP not reviewed this encounter.   Tomi Bamberger, PA-C 10/09/23 [phone]

## 2023-10-09 NOTE — ED Triage Notes (Addendum)
 Pt presents for staples removal from head.Suture placed 09/29/23 at [facility].  3 staples present. Pt denies complication

## 2024-04-18 NOTE — Telephone Encounter (Signed)
 Patient returned your call in regards to the message on 04/17/24

## 2024-04-24 ENCOUNTER — Other Ambulatory Visit: Payer: Self-pay

## 2024-04-24 ENCOUNTER — Emergency Department (HOSPITAL_COMMUNITY)

## 2024-04-24 ENCOUNTER — Emergency Department (HOSPITAL_COMMUNITY): Admission: EM | Admit: 2024-04-24 | Discharge: 2024-04-24 | Disposition: A | Discharge status: Home / Self Care

## 2024-04-24 ENCOUNTER — Encounter (HOSPITAL_COMMUNITY): Payer: Self-pay

## 2024-04-24 DIAGNOSIS — E876 Hypokalemia: Secondary | ICD-10-CM | POA: Diagnosis not present

## 2024-04-24 DIAGNOSIS — R1084 Generalized abdominal pain: Secondary | ICD-10-CM

## 2024-04-24 DIAGNOSIS — K8020 Calculus of gallbladder without cholecystitis without obstruction: Secondary | ICD-10-CM | POA: Diagnosis not present

## 2024-04-24 DIAGNOSIS — R109 Unspecified abdominal pain: Secondary | ICD-10-CM | POA: Diagnosis present

## 2024-04-24 LAB — URINALYSIS, ROUTINE W REFLEX MICROSCOPIC
Bacteria, UA: NONE SEEN
Bilirubin Urine: NEGATIVE
Glucose, UA: NEGATIVE mg/dL
Hgb urine dipstick: NEGATIVE
Ketones, ur: 5 mg/dL — AB
Nitrite: NEGATIVE
Protein, ur: NEGATIVE mg/dL
Specific Gravity, Urine: 1.025 (ref 1.005–1.030)
pH: 5 (ref 5.0–8.0)

## 2024-04-24 LAB — COMPREHENSIVE METABOLIC PANEL WITH GFR
ALT: 32 U/L (ref 0–44)
AST: 24 U/L (ref 15–41)
Albumin: 4 g/dL (ref 3.5–5.0)
Alkaline Phosphatase: 66 U/L (ref 38–126)
Anion gap: 10 (ref 5–15)
BUN: 25 mg/dL — ABNORMAL HIGH (ref 8–23)
CO2: 22 mmol/L (ref 22–32)
Calcium: 9.2 mg/dL (ref 8.9–10.3)
Chloride: 103 mmol/L (ref 98–111)
Creatinine, Ser: 0.89 mg/dL (ref 0.44–1.00)
GFR, Estimated: >60 mL/min (ref >60)
Glucose, Bld: 106 mg/dL — ABNORMAL HIGH (ref 70–99)
Potassium: 3.4 mmol/L — ABNORMAL LOW (ref 3.5–5.1)
Sodium: 135 mmol/L (ref 135–145)
Total Bilirubin: 0.7 mg/dL (ref 0.0–1.2)
Total Protein: 6.4 g/dL — ABNORMAL LOW (ref 6.5–8.1)

## 2024-04-24 LAB — CBC
HCT: 35.3 % — ABNORMAL LOW (ref 36.0–46.0)
Hemoglobin: 12 g/dL (ref 12.0–15.0)
MCH: 31.9 pg (ref 26.0–34.0)
MCHC: 34 g/dL (ref 30.0–36.0)
MCV: 93.9 fL (ref 80.0–100.0)
Platelets: 239 K/uL (ref 150–400)
RBC: 3.76 MIL/uL — ABNORMAL LOW (ref 3.87–5.11)
RDW: 12.8 % (ref 11.5–15.5)
WBC: 7.9 K/uL (ref 4.0–10.5)
nRBC: 0 % (ref 0.0–0.2)

## 2024-04-24 LAB — LIPASE, BLOOD: Lipase: 27 U/L (ref 11–51)

## 2024-04-24 LAB — TROPONIN I (HIGH SENSITIVITY): Troponin I (High Sensitivity): 3 ng/L (ref <18)

## 2024-04-24 MED ORDER — ONDANSETRON HCL 4 MG/2ML IJ SOLN
4.0000 mg | Freq: Once | INTRAMUSCULAR | Status: AC
  Administered 2024-04-24: 4 mg via INTRAVENOUS
  Filled 2024-04-24: qty 2

## 2024-04-24 MED ORDER — MORPHINE SULFATE (PF) 4 MG/ML IV SOLN
4.0000 mg | Freq: Once | INTRAVENOUS | Status: AC
  Administered 2024-04-24: 4 mg via INTRAVENOUS
  Filled 2024-04-24: qty 1

## 2024-04-24 MED ORDER — OXYCODONE HCL 5 MG PO TABS: 5.0000 mg | ORAL_TABLET | ORAL | 0 refills | Status: AC | PRN

## 2024-04-24 MED ORDER — ONDANSETRON 4 MG PO TBDP
4.0000 mg | ORAL_TABLET | Freq: Three times a day (TID) | ORAL | 0 refills | Status: AC | PRN
Start: 2024-04-24 — End: ?

## 2024-04-24 MED ORDER — IOHEXOL 350 MG/ML SOLN
75.0000 mL | Freq: Once | INTRAVENOUS | Status: AC | PRN
  Administered 2024-04-24: 75 mL via INTRAVENOUS

## 2024-04-24 NOTE — Discharge Instructions (Addendum)
 Follow up with general surgery, call today to schedule follow up. Return to the ER at any time for worsening or concerning symptoms, especially for fever (temperature 100.4 or higher), pain or vomiting not controlled with medications provided.   Take Oxycodone  as prescribed as needed for pain. Do not drive or operate machinery while taking oxycodone . Take Zofran  as needed as prescribed for nausea and vomiting.   These medications can cause constipation. Manage as needed.

## 2024-04-24 NOTE — ED Provider Notes (Signed)
 [facility] AT [facility] Provider Note   CSN: [id number] Arrival date & time: 04/24/24  9377     Patient presents with: Abdominal Pain   Tonya Pope is a 73 y.o. female.   73 year old female with complaint of right side abdominal pain onset 10pm last night, constant, waxes and wanes in severity. Radiates to back and epigastric areas. Associated with nausea, no changes in bowel or bladder habits (states diarrhea x 2 months, unchanged, non bloody). Not worse with taking a deep breath, movement, changes in position. Prior abdominal surgeries include hysterectomy and appendectomy.        Prior to Admission medications   Medication Sig Start Date End Date Taking? Authorizing Provider  ondansetron  (ZOFRAN -ODT) 4 MG disintegrating tablet Take 1 tablet (4 mg total) by mouth every 8 (eight) hours as needed for nausea or vomiting. 04/24/24  Yes Beverley Leita LABOR, PA-C  oxyCODONE  (ROXICODONE ) 5 MG immediate release tablet Take 1 tablet (5 mg total) by mouth every 4 (four) hours as needed for severe pain (pain score 7-10). 04/24/24  Yes Beverley Leita LABOR, PA-C  ALPRAZolam  (XANAX ) 0.25 MG tablet Take 0.25 mg by mouth daily as needed for anxiety. 10/02/20   [provider]  Calcium Carb-Cholecalciferol (CALCIUM/VITAMIN D ) 600-400 MG-UNIT TABS Take 1 tablet by mouth daily.    [provider]  docusate sodium  (COLACE) 100 MG capsule Take 1 capsule (100 mg total) by mouth 2 (two) times daily. 05/12/23   Augustus Almarie RAMAN, PA-C  hydroxychloroquine  (PLAQUENIL ) 200 MG tablet Take 1 tablet (200 mg total) by mouth daily. 05/19/23   Augustus Almarie RAMAN, PA-C  hyoscyamine (ANASPAZ) 0.125 MG TBDP disintergrating tablet Place 0.125 mg under the tongue every 4 (four) hours as needed for bladder spasms. 02/22/23   [provider]  latanoprost  (XALATAN ) 0.005 % ophthalmic solution Place 1 drop into both eyes at bedtime. 02/05/20   [provider]   loperamide (IMODIUM A-D) 2 MG tablet Take 4 mg by mouth as needed for diarrhea or loose stools.    [provider]  methocarbamol  (ROBAXIN ) 500 MG tablet Take 1 tablet (500 mg total) by mouth every 6 (six) hours as needed for muscle spasms. 05/12/23   Augustus Almarie RAMAN, PA-C  metoprolol  tartrate (LOPRESSOR ) 25 MG tablet Take 0.5 tablets (12.5 mg total) by mouth daily as needed. 07/01/21 05/09/23  Monetta Redell PARAS, MD  pantoprazole  (PROTONIX ) 40 MG tablet Take 1 tablet (40 mg total) by mouth 2 (two) times daily. Patient not taking: Reported on 05/09/2023 05/09/18 05/09/23  Nafziger, Cory, NP  saccharomyces boulardii (FLORASTOR) 250 MG capsule Take 1 capsule (250 mg total) by mouth 2 (two) times daily. For 2 weeks 04/24/17   Abran Norleen SAILOR, MD  tiZANidine  (ZANAFLEX ) 4 MG tablet Take 4 mg by mouth daily as needed for muscle spasms. 03/28/20   [provider]  venlafaxine  XR (EFFEXOR -XR) 37.5 MG 24 hr capsule Take 37.5 mg by mouth daily. 09/09/22   [provider]  venlafaxine  XR (EFFEXOR -XR) 75 MG 24 hr capsule Take 75 mg by mouth daily. 03/06/20   [provider]    Allergies: Epinephrine , Nsaids, Promethazine  hcl, Codeine phosphate, and Penicillins    Review of Systems Negative except as per HPI Updated Vital Signs BP 125/60   Pulse 94   Temp 98 F (36.7 C) (Oral)   Resp (!) 26   SpO2 99%   Physical Exam Vitals and nursing note reviewed.  Constitutional:  General: She is not in acute distress.    Appearance: She is well-developed. She is not diaphoretic.  HENT:     Head: Normocephalic and atraumatic.  Cardiovascular:     Rate and Rhythm: Normal rate and regular rhythm.     Heart sounds: Normal heart sounds.  Pulmonary:     Effort: Pulmonary effort is normal.     Breath sounds: Normal breath sounds.  Abdominal:     Palpations: Abdomen is soft.     Tenderness: There is abdominal tenderness in the right lower quadrant and left lower quadrant.  There is no right CVA tenderness or left CVA tenderness. Negative signs include Damarea Merkel's sign.     Comments: Mild ttp right and left lower quadrants per report after exam  Skin:    General: Skin is warm and dry.     Findings: No erythema or rash.  Neurological:     Mental Status: She is alert and oriented to person, place, and time.  Psychiatric:        Behavior: Behavior normal.     (all labs ordered are listed, but only abnormal results are displayed) Labs Reviewed  COMPREHENSIVE METABOLIC PANEL WITH GFR - Abnormal; Notable for the following components:      Result Value   Potassium 3.4 (*)    Glucose, Bld 106 (*)    BUN 25 (*)    Total Protein 6.4 (*)    All other components within normal limits  CBC - Abnormal; Notable for the following components:   RBC 3.76 (*)    HCT 35.3 (*)    All other components within normal limits  URINALYSIS, ROUTINE W REFLEX MICROSCOPIC - Abnormal; Notable for the following components:   APPearance HAZY (*)    Ketones, ur 5 (*)    Leukocytes,Ua SMALL (*)    All other components within normal limits  LIPASE, BLOOD  TROPONIN I (HIGH SENSITIVITY)    EKG: EKG Interpretation Date/Time:  Wednesday April 24 2024 07:04:15 EDT Ventricular Rate:  67 PR Interval:  150 QRS Duration:  92 QT Interval:  410 QTC Calculation: 433 R Axis:   58  Text Interpretation: Sinus rhythm Normal axis No significant change when compared to prior EKG from 09/29/2023 Confirmed by Gennaro Bouchard ([id number]) on 04/24/2024 7:08:29 AM  Radiology: US  Abdomen Limited Result Date: 04/24/2024 CLINICAL DATA:  Right upper quadrant pain. EXAM: ULTRASOUND ABDOMEN LIMITED RIGHT UPPER QUADRANT COMPARISON:  CT abdomen pelvis 04/24/2024. FINDINGS: Gallbladder: Mobile shadowing echogenic stones measuring up to 1.2 cm. Gallbladder wall thickening, 6 mm. Negative sonographic Toriana Sponsel sign. There is pericholecystic fluid. Common bile duct: Diameter: 10 mm, dilated for age. No intrahepatic  biliary ductal dilatation. Liver: Diffusely increased in echogenicity. No definite focal lesion. Portal vein is patent on color Doppler imaging with normal direction of blood flow towards the liver. Other: None. IMPRESSION: 1. Cholelithiasis with wall thickening and pericholecystic fluid but no sonographic Echo Propp sign. Findings may be due to chronic cholecystitis. 2. Common bile duct is dilated for age. Choledocholithiasis cannot be excluded. If further evaluation is desired, MR abdomen with MRCP, without and with contrast, is recommended. 3. Hepatic steatosis. Electronically Signed   By: Newell Eke M.D.   On: 04/24/2024 09:59   CT ABDOMEN PELVIS W CONTRAST Result Date: 04/24/2024 CLINICAL DATA:  Abdominal pain, acute, nonlocalized EXAM: CT ABDOMEN AND PELVIS WITH CONTRAST TECHNIQUE: Multidetector CT imaging of the abdomen and pelvis was performed using the standard protocol following bolus administration of intravenous contrast. RADIATION DOSE  REDUCTION: This exam was performed according to the departmental dose-optimization program which includes automated exposure control, adjustment of the mA and/or kV according to patient size and/or use of iterative reconstruction technique. CONTRAST:  75mL OMNIPAQUE  IOHEXOL  350 MG/ML SOLN COMPARISON:  CT scan abdomen and pelvis from 05/08/2023. FINDINGS: Lower chest: There are subpleural atelectatic changes in the visualized lung bases. No overt consolidation. No pleural effusion. There is a stable subpleural 3 x 5 mm noncalcified nodule in the left lung lower lobe, which is unchanged since the prior study and favored benign. No routine follow-up is recommended. The heart is normal in size. No pericardial effusion. Hepatobiliary: The liver is normal in size. Non-cirrhotic configuration. No suspicious mass. These is mild diffuse hepatic steatosis. No intrahepatic or extrahepatic bile duct dilation. The gallbladder is physiologically distended. No abnormal wall  thickening or pericholecystic fat stranding. No calcified gallstones. There is trace amount of fluid between the gallbladder and liver capsule, which is nonspecific and appear similar to the prior study. Correlate clinically if there is high degree of suspicion for acute cholecystitis, in which case consider additional evaluation with ultrasound abdomen or nuclear medicine HIDA scan. Pancreas: Unremarkable. No pancreatic ductal dilatation or surrounding inflammatory changes. Spleen: Within normal limits. No focal lesion. Adrenals/Urinary Tract: Adrenal glands are unremarkable. No suspicious renal mass. No hydronephrosis. No renal or ureteric calculi. Urinary bladder is under distended, precluding optimal assessment. However, no large mass or stones identified. No perivesical fat stranding. Stomach/Bowel: No disproportionate dilation of the small or large bowel loops. No evidence of abnormal bowel wall thickening or inflammatory changes. The appendix is surgically absent. Vascular/Lymphatic: No ascites or pneumoperitoneum. No abdominal or pelvic lymphadenopathy, by size criteria. No aneurysmal dilation of the major abdominal arteries. Reproductive: The uterus is surgically absent. No large adnexal mass. Other: There is a tiny fat containing umbilical hernia. The soft tissues and abdominal wall are otherwise unremarkable. Musculoskeletal: No suspicious osseous lesions. Note is made of right hip arthroplasty. There are mild - moderate multilevel degenerative changes in the visualized spine. IMPRESSION: 1. No acute inflammatory process identified within the abdomen or pelvis. 2. Trace amount of fluid between the gallbladder and the liver capsule, as discussed in detail above. 3. Multiple other nonacute observations, as described above. Electronically Signed   By: Ree Molt M.D.   On: 04/24/2024 08:35   DG Chest Port 1 View Result Date: 04/24/2024 EXAM: 1 VIEW XRAY OF THE CHEST 04/24/2024 07:20:00 AM COMPARISON:  Portable chest x ray 05/08/2023 and earlier. CLINICAL HISTORY: 73 year old Female with epigastric pain, right abdominal pain, 10-month history of diarrhea, and mild nausea. FINDINGS: LUNGS AND PLEURA: . compared to last year. No focal pulmonary opacity. No pulmonary edema. No pleural effusion. No pneumothorax. HEART AND MEDIASTINUM: No acute abnormality of the cardiac and mediastinal silhouettes. BONES AND SOFT TISSUES: Chronic cervical ACDF partially visible. No acute osseous abnormality. ABDOMEN (INCIDENTAL FINDINGS): Negative visible bowel gas pattern. IMPRESSION: 1. Lower lung volumes, No acute cardiopulmonary process. Electronically signed by: Helayne Hurst MD 04/24/2024 07:29 AM EDT RP Workstation: [id number]     Procedures   Medications Ordered in the ED  ondansetron  (ZOFRAN ) injection 4 mg (4 mg Intravenous Given 04/24/24 0719)  morphine  (PF) 4 MG/ML injection 4 mg (4 mg Intravenous Given 04/24/24 0720)  iohexol  (OMNIPAQUE ) 350 MG/ML injection 75 mL (75 mLs Intravenous Contrast Given 04/24/24 0743)  morphine  (PF) 4 MG/ML injection 4 mg (4 mg Intravenous Given 04/24/24 0900)    Clinical Course as of 04/24/24 1136  Wed Apr 24, 2024  1114 US  Abdomen Limited [AP]    Clinical Course User Index [AP] Verita Arlean CHRISTELLA Jann                                 Medical Decision Making Amount and/or Complexity of Data Reviewed Labs: ordered. Radiology: ordered.  Risk Prescription drug management.   This patient presents to the ED for concern of abdominal pain, this involves an extensive number of treatment options, and is a complaint that carries with it a high risk of complications and morbidity.  The differential diagnosis includes but not limited to SBO, acute cholecystitis, pancreatitis, ACS    Co morbidities / Chronic conditions that complicate the patient evaluation  GERD, osteoporosis, depression, anemia, diarrhea (c. Diff), endometriosis    Additional history  obtained:  Additional history obtained from EMR External records from outside source obtained and reviewed including prior labs and imaging on file   Lab Tests:  I Ordered, and personally interpreted labs.  The pertinent results include: CBC without significant findings, specifically normal WBC.  CMP with mild hypokalemia, normal AST, ALT, alk phos and bilirubin.  Lipase normal.  Troponin negative at 3.  Urinalysis with small leukocytes without urinary symptoms, no bacteria.   Imaging Studies ordered:  I ordered imaging studies including CT abdomen pelvis with contrast, chest x-ray, right upper quadrant ultrasound I independently visualized and interpreted imaging which showed enlarged gallbladder, gallstones.  Chest x-ray unremarkable. I agree with the radiologist interpretation   Cardiac Monitoring: / EKG:  The patient was maintained on a cardiac monitor.  I personally viewed and interpreted the cardiac monitored which showed an underlying rhythm of: Sinus rhythm, rate 67   Problem List / ED Course / Critical interventions / Medication management  73 year old female presents with complaint of right sided abdominal pain radiates to epigastric area and back.  Nausea without vomiting, no fevers.  On exam, is tender to lower abdomen, negative Antoine Fiallos sign.  CT abdomen pelvis concerning for enlarged gallbladder, followed with right upper quadrant ultrasound which shows gallstones, negative Rithika Seel sign.  Labs reassuring including normal LFTs, normal WBC.  Discussed results with patient.  Shared decision making.  Offered consultation with general surgery if pain persists versus follow-up outpatient.  Pain has improved.  Patient would prefer follow-up outpatient.  Understands to return to ER for pain or vomiting not controlled with medications, fever at any time or other concerning symptoms. I ordered medication including morphine , Zofran  Reevaluation of the patient after these medicines showed  that the patient pain improved I have reviewed the patients home medicines and have made adjustments as needed   Consultations Obtained:  I requested consultation with the Dr. Gennaro, ER attending,  and discussed lab and imaging findings as well as pertinent plan - they recommend: agrees with plan of care   Social Determinants of Health:  Has PCP   Test / Admission - Considered:  Stable for discharge with close follow-up and return precautions. Consider MRCP however labs are unremarkable, patient is afebrile, no right upper quadrant tenderness on exam today.      Final diagnoses:  Generalized abdominal pain  Calculus of gallbladder without cholecystitis without obstruction    ED Discharge Orders          Ordered    oxyCODONE  (ROXICODONE ) 5 MG immediate release tablet  Every 4 hours PRN        04/24/24 1116  ondansetron  (ZOFRAN -ODT) 4 MG disintegrating tablet  Every 8 hours PRN        04/24/24 1116               Beverley Leita LABOR, PA-C 04/24/24 1136    Kammerer, Megan L, DO 04/25/24 1003

## 2024-04-24 NOTE — ED Triage Notes (Signed)
 Pt is coming in with medic, coming from home, she is coming in for right abd pain that started lastnight. She been seeing her primary care for 2 months for diarrhea. Mild nausea.   Medic vitals   126/78 90hr 18rr 97%ra 121bgl

## 2024-04-24 NOTE — ED Notes (Signed)
 Patient transported to CT

## 2024-05-06 ENCOUNTER — Other Ambulatory Visit: Payer: Self-pay | Admitting: General Surgery

## 2024-05-31 ENCOUNTER — Encounter (HOSPITAL_COMMUNITY): Payer: Self-pay

## 2024-05-31 NOTE — Pre-Procedure Instructions (Signed)
 Surgical Instructions   Your procedure is scheduled on June 06, 2024. Report to [facility] Main Entrance A at 8:45 A.M., then check in with the Admitting office. Any questions or running late day of surgery: call [phone]  Questions prior to your surgery date: call [phone], Monday-Friday, 8am-4pm. If you experience any cold or flu symptoms such as cough, fever, chills, shortness of breath, etc. between now and your scheduled surgery, please notify us  at the above number.     Remember:  Do not eat after midnight the night before your surgery   You may drink clear liquids until 7:45 AM the morning of your surgery.   Clear liquids allowed are: Water , Non-Citrus Juices (without pulp), Carbonated Beverages, Clear Tea (no milk, honey, etc.), Black Coffee Only (NO MILK, CREAM OR POWDERED CREAMER of any kind), and Gatorade.  Patient Instructions  The night before surgery:  No food after midnight. ONLY clear liquids after midnight  The day of surgery (if you do NOT have diabetes):  Drink ONE (1) Pre-Surgery Clear Ensure by 7:45 AM the morning of surgery. Drink in one sitting. Do not sip.  This drink was given to you during your hospital  pre-op appointment visit.  Nothing else to drink after completing the  Pre-Surgery Clear Ensure.          If you have questions, please contact your surgeon's office.    Take these medicines the morning of surgery with A SIP OF WATER : famotidine  (PEPCID )  venlafaxine  XR (EFFEXOR -XR)    May take these medicines IF NEEDED: ALPRAZolam  (XANAX )  azelastine (ASTELIN) nasal spray  hyoscyamine (ANASPAZ)  loperamide (IMODIUM A-D)  metoprolol  tartrate (LOPRESSOR )  ondansetron  (ZOFRAN -ODT)  oxyCODONE  (ROXICODONE )    One week prior to surgery, STOP taking any Aspirin (unless otherwise instructed by your surgeon) Aleve, Naproxen, Ibuprofen , Motrin , Advil , Goody's, BC's, all herbal medications, fish oil, and non-prescription vitamins.                      Do NOT Smoke (Tobacco/Vaping) for 24 hours prior to your procedure.  If you use a CPAP at night, you may bring your mask/headgear for your overnight stay.   You will be asked to remove any contacts, glasses, piercing's, hearing aid's, dentures/partials prior to surgery. Please bring cases for these items if needed.    Patients discharged the day of surgery will not be allowed to drive home, and someone needs to stay with them for 24 hours.  SURGICAL WAITING ROOM VISITATION Patients may have no more than 2 support people in the waiting area - these visitors may rotate.   Pre-op nurse will coordinate an appropriate time for 1 ADULT support person, who may not rotate, to accompany patient in pre-op.  Children under the age of 75 must have an adult with them who is not the patient and must remain in the main waiting area with an adult.  If the patient needs to stay at the hospital during part of their recovery, the visitor guidelines for inpatient rooms apply.  Please refer to the [facility] website for the visitor guidelines for any additional information.   If you received a COVID test during your pre-op visit  it is requested that you wear a mask when out in public, stay away from anyone that may not be feeling well and notify your surgeon if you develop symptoms. If you have been in contact with anyone that has tested positive in the last 10 days please  notify you surgeon.      Pre-operative CHG Bathing Instructions   You can play a key role in reducing the risk of infection after surgery. Your skin needs to be as free of germs as possible. You can reduce the number of germs on your skin by washing with CHG (chlorhexidine  gluconate) soap before surgery. CHG is an antiseptic soap that kills germs and continues to kill germs even after washing.   DO NOT use if you have an allergy to chlorhexidine /CHG or antibacterial soaps. If your skin becomes reddened or irritated, stop  using the CHG and notify one of our RNs at [phone].              TAKE A SHOWER THE NIGHT BEFORE SURGERY   Please keep in mind the following:  DO NOT shave, including legs and underarms, 48 hours prior to surgery.   You may shave your face before/day of surgery.  Place clean sheets on your bed the night before surgery Use a clean washcloth (not used since being washed) for shower. DO NOT sleep with pet's night before surgery.  CHG Shower Instructions:  Wash your face and private area with normal soap. If you choose to wash your hair, wash first with your normal shampoo.  After you use shampoo/soap, rinse your hair and body thoroughly to remove shampoo/soap residue.  Turn the water  OFF and apply half the bottle of CHG soap to a CLEAN washcloth.  Apply CHG soap ONLY FROM YOUR NECK DOWN TO YOUR TOES (washing for 3-5 minutes)  DO NOT use CHG soap on face, private areas, open wounds, or sores.  Pay special attention to the area where your surgery is being performed.  If you are having back surgery, having someone wash your back for you may be helpful. Wait 2 minutes after CHG soap is applied, then you may rinse off the CHG soap.  Pat dry with a clean towel  Put on clean pajamas    Additional instructions for the day of surgery: If you choose, you may shower the morning of surgery with an antibacterial soap.  DO NOT APPLY any lotions, deodorants, cologne, or perfumes.   Do not wear jewelry or makeup Do not wear nail polish, gel polish, artificial nails, or any other type of covering on natural nails (fingers and toes) Do not bring valuables to the hospital. [facility] is not responsible for valuables/personal belongings. Put on clean/comfortable clothes.  Please brush your teeth.  Ask your nurse before applying any prescription medications to the skin.

## 2024-06-03 ENCOUNTER — Encounter (HOSPITAL_COMMUNITY): Payer: Self-pay

## 2024-06-03 ENCOUNTER — Encounter (HOSPITAL_COMMUNITY)
Admission: RE | Admit: 2024-06-03 | Discharge: 2024-06-03 | Disposition: A | Discharge status: Home / Self Care | Source: Ambulatory Visit | Attending: General Surgery | Admitting: General Surgery

## 2024-06-03 ENCOUNTER — Other Ambulatory Visit: Payer: Self-pay

## 2024-06-03 VITALS — BP 116/64 | HR 86 | Temp 98.5°F | Resp 17 | Ht 61.0 in | Wt 125.9 lb

## 2024-06-03 DIAGNOSIS — R002 Palpitations: Secondary | ICD-10-CM | POA: Diagnosis not present

## 2024-06-03 DIAGNOSIS — K219 Gastro-esophageal reflux disease without esophagitis: Secondary | ICD-10-CM | POA: Insufficient documentation

## 2024-06-03 DIAGNOSIS — D649 Anemia, unspecified: Secondary | ICD-10-CM | POA: Diagnosis not present

## 2024-06-03 DIAGNOSIS — E785 Hyperlipidemia, unspecified: Secondary | ICD-10-CM | POA: Diagnosis not present

## 2024-06-03 DIAGNOSIS — I2510 Atherosclerotic heart disease of native coronary artery without angina pectoris: Secondary | ICD-10-CM | POA: Insufficient documentation

## 2024-06-03 DIAGNOSIS — K8020 Calculus of gallbladder without cholecystitis without obstruction: Secondary | ICD-10-CM | POA: Diagnosis not present

## 2024-06-03 DIAGNOSIS — Z9189 Other specified personal risk factors, not elsewhere classified: Secondary | ICD-10-CM | POA: Diagnosis not present

## 2024-06-03 DIAGNOSIS — Z01812 Encounter for preprocedural laboratory examination: Secondary | ICD-10-CM | POA: Insufficient documentation

## 2024-06-03 DIAGNOSIS — K449 Diaphragmatic hernia without obstruction or gangrene: Secondary | ICD-10-CM | POA: Diagnosis not present

## 2024-06-03 HISTORY — DX: Personal history of other diseases of the digestive system: Z87.19

## 2024-06-03 HISTORY — DX: Cardiac arrhythmia, unspecified: I49.9

## 2024-06-03 LAB — CBC
HCT: 35.2 % — ABNORMAL LOW (ref 36.0–46.0)
Hemoglobin: 12 g/dL (ref 12.0–15.0)
MCH: 31.3 pg (ref 26.0–34.0)
MCHC: 34.1 g/dL (ref 30.0–36.0)
MCV: 91.7 fL (ref 80.0–100.0)
Platelets: 272 K/uL (ref 150–400)
RBC: 3.84 MIL/uL — ABNORMAL LOW (ref 3.87–5.11)
RDW: 13.3 % (ref 11.5–15.5)
WBC: 5.3 K/uL (ref 4.0–10.5)
nRBC: 0 % (ref 0.0–0.2)

## 2024-06-03 LAB — BASIC METABOLIC PANEL WITH GFR
Anion gap: 9 (ref 5–15)
BUN: 24 mg/dL — ABNORMAL HIGH (ref 8–23)
CO2: 26 mmol/L (ref 22–32)
Calcium: 9.1 mg/dL (ref 8.9–10.3)
Chloride: 105 mmol/L (ref 98–111)
Creatinine, Ser: 0.77 mg/dL (ref 0.44–1.00)
GFR, Estimated: >60 mL/min (ref >60)
Glucose, Bld: 92 mg/dL (ref 70–99)
Potassium: 3.2 mmol/L — ABNORMAL LOW (ref 3.5–5.1)
Sodium: 140 mmol/L (ref 135–145)

## 2024-06-03 NOTE — Progress Notes (Signed)
 PCP - Tari Banter, PA-C Cardiologist - Dr. Lennart Kallman - Last office visit 10/30/2023  PPM/ICD - Denies Device Orders - n/a Rep Notified - n/a  Chest x-ray - 04/24/2024 EKG - 04/24/2024 Stress Test - Denies ECHO - 04/13/2020 Cardiac Cath - Denies  Sleep Study - Denies CPAP - n/a  No DM  Last dose of GLP1 agonist- n/a GLP1 instructions: n/a  Blood Thinner Instructions: n/a Aspirin Instructions: n/a  ERAS Protcol - Clear liquids until 0745 morning of surgery PRE-SURGERY Ensure or G2- Ensure given to pt with instructions  COVID TEST- n/a   Anesthesia review: Yes. Difficult airway flag from previous surgery 04/2023   Patient denies shortness of breath, fever, cough and chest pain at PAT appointment. Pt denies any respiratory illness/infection in the last two months.    All instructions explained to the patient, with a verbal understanding of the material. Patient agrees to go over the instructions while at home for a better understanding. Patient also instructed to self quarantine after being tested for COVID-19. The opportunity to ask questions was provided.

## 2024-06-04 NOTE — Anesthesia Preprocedure Evaluation (Signed)
 Anesthesia Evaluation  Patient identified by MRN, date of birth, ID band Patient awake    Reviewed: Allergy & Precautions, NPO status , Patient's Chart, lab work & pertinent test results, reviewed documented beta blocker date and time   History of Anesthesia Complications (+) DIFFICULT AIRWAYNegative for: history of anesthetic complications  Airway Mallampati: II  TM Distance: >3 FB Neck ROM: Limited  Mouth opening: Limited Mouth Opening  Dental  (+) Teeth Intact, Dental Advisory Given   Pulmonary neg sleep apnea, neg COPD, neg recent URI   breath sounds clear to auscultation       Cardiovascular hypertension, Pt. on medications and Pt. on home beta blockers + Valvular Problems/Murmurs MVP  Rhythm:Regular Rate:Normal     Neuro/Psych    GI/Hepatic hiatal hernia,GERD  Medicated and Controlled,,  Endo/Other  neg diabetes    Renal/GU      Musculoskeletal  (+) Arthritis ,  S/p ACDF   Abdominal   Peds  Hematology  (+) Blood dyscrasia, anemia   Anesthesia Other Findings   Reproductive/Obstetrics                              Anesthesia Physical Anesthesia Plan  ASA: 2  Anesthesia Plan: General   Post-op Pain Management:    Induction: Intravenous  PONV Risk Score and Plan: 3 and Ondansetron , Dexamethasone  and Treatment may vary due to age or medical condition  Airway Management Planned: Oral ETT and Video Laryngoscope Planned  Additional Equipment:   Intra-op Plan:   Post-operative Plan: Extubation in OR  Informed Consent:      Dental advisory given  Plan Discussed with:   Anesthesia Plan Comments: (PAT note written 06/04/2024 by Jamielyn Petrucci, PA-C.  )         Anesthesia Quick Evaluation

## 2024-06-04 NOTE — Progress Notes (Signed)
 Anesthesia Chart Review:  Case: [id number] Date/Time: 06/06/24 1034   Procedure: LAPAROSCOPIC CHOLECYSTECTOMY - ICG DYE   Anesthesia type: General   Diagnosis: Calculus of gallbladder without cholecystitis without obstruction [K80.20]   Pre-op diagnosis: GALLSTONES   [facility] / [facility]   Surgeons: Ebbie Cough, MD       DISCUSSION: Patient is a 73 year old female scheduled for the above procedure.  History includes never smoker, DIFFICULT INTUBATION, palpitations (metoprolol  as needed), MVP (normal MV structure, no MR or MS 03/2020 TTE), HLD, GERD, hiatal hernia, anemia, osteoarthritis (right THA 10/02/2023), spinal surgery (C4-6 ACDF 01/13/2017), appendectomy(05/09/2023).  For 05/09/2023 intubation, she was an anticipated difficult airway: Induction Type: IV induction Ventilation: Mask ventilation without difficulty Laryngoscope Size: Glidescope and 3 Grade View: Grade II Tube type: Oral Number of attempts: 1 Airway Equipment and Method: Stylet and Oral airway Dental Injury: Teeth and Oropharynx as per pre-operative assessment  Difficulty Due To: Difficulty was anticipated, Difficult Airway- due to anterior larynx, Difficult Airway- due to reduced neck mobility and Difficult Airway- due to limited oral opening  Anesthesia team to evaluate on the day of surgery.    VS: BP 116/64   Pulse 86   Temp 36.9 C   Resp 17   Ht 5' 1 (1.549 m)   Wt 57.1 kg   SpO2 95%   BMI 23.79 kg/m    PROVIDERS: Tammy Tari DASEN, PA-C is PCP Debora Mealing, MD is cardiologist ([facility]), last visit 10/30/2023.   LABS: Labs reviewed: Acceptable for surgery. (all labs ordered are listed, but only abnormal results are displayed)  Labs Reviewed  BASIC METABOLIC PANEL WITH GFR - Abnormal; Notable for the following components:      Result Value   Potassium 3.2 (*)    BUN 24 (*)    All other components within normal limits  CBC - Abnormal; Notable for the following components:   RBC  3.84 (*)    HCT 35.2 (*)    All other components within normal limits    IMAGES: US  Abd Limited 04/24/2024: MPRESSION: 1. Cholelithiasis with wall thickening and pericholecystic fluid but no sonographic Murphy sign. Findings may be due to chronic cholecystitis. 2. Common bile duct is dilated for age. Choledocholithiasis cannot be excluded. If further evaluation is desired, MR abdomen with MRCP, without and with contrast, is recommended. 3. Hepatic steatosis.   1V PCXR 04/24/2024: FINDINGS: - LUNGS AND PLEURA: Compared to last year. No focal pulmonary opacity. No pulmonary edema. No pleural effusion. No pneumothorax. - HEART AND MEDIASTINUM: No acute abnormality of the cardiac and mediastinal silhouettes. - BONES AND SOFT TISSUES: Chronic cervical ACDF partially visible. No acute osseous abnormality. - ABDOMEN (INCIDENTAL FINDINGS): Negative visible bowel gas pattern. IMPRESSION: 1. Lower lung volumes, No acute cardiopulmonary process.   EKG: 04/24/2024: Sinus rhythm Normal axis No significant change when compared to prior EKG from 09/29/2023 Confirmed by Gennaro Bouchard ([id number]) on 04/24/2024 7:08:29 AM  CV: Echo 04/13/2020: IMPRESSIONS   1. Left ventricular ejection fraction, by estimation, is 60 to 65%. The  left ventricle has normal function. Left ventricular endocardial border  not optimally defined to evaluate regional wall motion. Left ventricular  diastolic parameters are consistent  with Grade I diastolic dysfunction (impaired relaxation).   2. Right ventricular systolic function is normal. The right ventricular  size is normal. There is normal pulmonary artery systolic pressure.   3. The mitral valve is normal in structure. No evidence of mitral valve  regurgitation. No evidence  of mitral stenosis.   4. The aortic valve is tricuspid. Aortic valve regurgitation is mild. No  aortic stenosis is present.   5. The inferior vena cava is normal in size with greater than 50%   respiratory variability, suggesting right atrial pressure of 3 mmHg.   Long Term Monitor 03/30/2020: - A ZIO monitor was performed for 7 days beginning 03/30/2020 to assess palpitation. - The cardiac rhythm throughout was sinus with average, minimum of maximum heart rates of 80, 47 and 159 bpm. - There were no pauses of 3 seconds or greater and no episodes of second or third-degree AV node block or sinus node exit block. - Ventricular ectopy was rare with isolated PVCs - Supraventricular ectopy was rare with isolated APCs and one 4 beat run of atrial premature contractions. - There is 17 triggered and diary events 12 of which were associated with PVCs or APCs. - Conclusion, although both ventricular and supraventricular ectopy were rare, the majority of the triggered and diary events are symptomatic APCs or PVCs.   Past Medical History:  Diagnosis Date   Abdominal pain, epigastric 03/27/2014   Achilles tendinitis of right lower extremity 02/12/2018   Acne    Anemia    Anxiety    Arthritis    Bone spur of right foot 02/16/2018   Formatting of this note might be different from the original. Added automatically from request for surgery [id number]   Carpal tunnel syndrome on left 10/31/2016   Cervical spondylosis without myelopathy 10/31/2016   Colon polyps    Depression    Diarrhea    C. Diff   Difficult intubation    patient stated had to use a smaller tube in previous surgery in 80's   Dysrhythmia    Palpitations - PRN metoprolol    GERD 03/28/2007   Qualifier: Diagnosis of  By: Krystal RN, Leeroy     GERD (gastroesophageal reflux disease)    Hematuria 03/28/2007   Qualifier: Diagnosis of  By: Krystal RN, Leeroy     History of colonic polyps 05/08/2008   Qualifier: Diagnosis of  By: Krystal MD, Reyes A    History of hiatal hernia    mild   Hyperlipidemia    Insomnia    Medication reaction 12/26/2015   Migraine    Mitral valve disease 08/03/2009   Formatting of this note might be  different from the original. Overview:  Qualifier: Diagnosis of  By: Krystal MD, Reyes A   MITRAL VALVE PROLAPSE 08/03/2009   Qualifier: Diagnosis of  By: Krystal MD, Reyes LABOR    OP (osteoporosis)    Osteoarthritis of right hip 10/31/2013   Osteoporosis 03/28/2007   Qualifier: Diagnosis of  By: Krystal, RN, Leeroy Deal of this note might be different from the original. Overview:  Qualifier: Diagnosis of  By: Krystal RN, Leeroy   Panic attacks 02/17/2014   Pneumonia    Primary osteoarthritis of first carpometacarpal joint of left hand 10/31/2016   Spinal stenosis, cervical region 01/13/2017   Trigger index finger of left hand 12/02/2019    Past Surgical History:  Procedure Laterality Date   ABDOMINAL HYSTERECTOMY     ANTERIOR CERVICAL DECOMP/DISCECTOMY FUSION N/A 01/13/2017   Procedure: ACDF - C4-C5 - C5-C6;  Surgeon: Onetha Kuba, MD;  Location: [facility] OR;  Service: Neurosurgery;  Laterality: N/A;   CARPAL TUNNEL RELEASE Left 11/15/2016   Procedure: LEFT CARPAL TUNNEL RELEASE;  Surgeon: Murrell Kuba, MD;  Location: [facility];  Service: Orthopedics;  Laterality: Left;   CARPAL TUNNEL RELEASE Right 08/18/2016   COLONOSCOPY W/ POLYPECTOMY     DIAGNOSTIC LAPAROSCOPY     for endometriosis   ENDOMETRIAL ABLATION     LAPAROSCOPIC APPENDECTOMY N/A 05/09/2023   Procedure: APPENDECTOMY LAPAROSCOPIC;  Surgeon: Ebbie Cough, MD;  Location: [facility];  Service: General;  Laterality: N/A;   TONSILLECTOMY AND ADENOIDECTOMY     TOTAL HIP ARTHROPLASTY Right 09/2023   Dr. Ernie at [facility]    MEDICATIONS:  ALPRAZolam  (XANAX ) 0.25 MG tablet   azelastine (ASTELIN) 0.1 % nasal spray   CALCIUM PO   Cholecalciferol (VITAMIN D3 PO)   famotidine  (PEPCID ) 40 MG tablet   hyoscyamine (ANASPAZ) 0.125 MG TBDP disintergrating tablet   latanoprost  (XALATAN ) 0.005 % ophthalmic solution   loperamide (IMODIUM A-D) 2 MG tablet   metoprolol  tartrate (LOPRESSOR ) 25 MG tablet   Multiple Vitamins-Minerals  (HAIR SKIN NAILS PO)   ondansetron  (ZOFRAN -ODT) 4 MG disintegrating tablet   oxyCODONE  (ROXICODONE ) 5 MG immediate release tablet   tiZANidine  (ZANAFLEX ) 4 MG tablet   venlafaxine  XR (EFFEXOR -XR) 37.5 MG 24 hr capsule   venlafaxine  XR (EFFEXOR -XR) 75 MG 24 hr capsule    0.9 %  sodium chloride  infusion    Isaiah Ruder, PA-C Surgical Short Stay/Anesthesiology [facility] Phone [phone] [facility] Phone [phone] 06/04/2024 4:22 PM

## 2024-06-04 NOTE — H&P (Signed)
 73 year old female I know from a laparoscopic appendectomy last year. She presents with a recent emergency room visit for abdominal pain. This was more diffuse but ended up localizing more to her right upper quadrant. This was associated with nausea. This did radiate to her back as well. She underwent an evaluation that showed normal liver function test. She initially underwent a CT scan that showed a distended gallbladder with no abnormal wall thickening. There is a trace amount of fluid between the gallbladder and the liver capsule. She underwent a ultrasound that showed cholelithiasis that she did have some wall thickening and Perry cholecystic fluid. Her common bile duct was noted to be 10 mm. She had resolution of her pain while she was in the emergency room and was sent home for outpatient evaluation. She has not been eating much since she was there on October 8. She has not had any additional episodes.  Review of Systems: A complete review of systems was obtained from the patient. I have reviewed this information and discussed as appropriate with the patient. See HPI as well for other ROS.  Review of Systems  Gastrointestinal: Positive for abdominal pain and nausea.  All other systems reviewed and are negative.  Medical History: History reviewed. No pertinent past medical history.  Past Surgical History:  Procedure Laterality Date  LAPAROSCOPIC APPENDECTOMY   Allergies  Allergen Reactions  Epinephrine  Other (See Comments)  Pass out / dizzy. This happened with dental procedure and gyn procedure. Hydroxychloroquine  Hives and Rash  Promethazine  Hcl Other (See Comments)  REACTION: CNS S.E. dystonia UNSPECIFIED CNS EFFECTS  Codeine Other (See Comments), Nausea and Rash  REACTION: nausea  Penicillins Other (See Comments) and Rash  REACTION: rash   Current Outpatient Medications on File Prior to Visit  Medication Sig Dispense Refill  ALPRAZolam  (XANAX ) 0.25 MG tablet Take 1 tablet by  mouth at bedtime as needed  azelastine (ASTELIN) 137 mcg nasal spray INSTILL TWO SPRAYS INTO AFFECTED NOSTRIL TWICE DAILY AS DIRECTED  calcium carbonate-vit D3-min 600 mg-10 mcg (400 unit) Tab Take 1 tablet by mouth once daily  docusate (COLACE) 100 MG capsule Take 100 mg by mouth 2 (two) times daily  hydroxychloroquine  (PLAQUENIL ) 200 mg tablet Take 200 mg by mouth once daily  hyoscyamine 0.125 mg disintegrating tablet place 1 tablet under the tongue every 4 hours as needed for abdominal cramping/diarrhea  latanoprost  (XALATAN ) 0.005 % ophthalmic solution  loperamide (IMODIUM A-D) 2 mg tablet Take 4 mg by mouth  methocarbamoL  (ROBAXIN ) 500 MG tablet Take 500 mg by mouth every 6 (six) hours as needed  metoprolol  tartrate (LOPRESSOR ) 25 MG tablet Take 12.5 mg by mouth  ondansetron  (ZOFRAN -ODT) 4 MG disintegrating tablet Take 4 mg by mouth every 8 (eight) hours as needed  oxyCODONE  (ROXICODONE ) 5 MG immediate release tablet Take 5 mg by mouth every 6 (six) hours as needed  pantoprazole  (PROTONIX ) 40 MG DR tablet  Saccharomyces boulardii (FLORASTOR) 250 mg capsule  tiZANidine  (ZANAFLEX ) 4 MG tablet Take 1 tablet by mouth at bedtime as needed  venlafaxine  (EFFEXOR -XR) 37.5 MG XR capsule Take 37.5 mg by mouth once daily  venlafaxine  (EFFEXOR -XR) 75 MG XR capsule   History reviewed. No pertinent family history.   Social History   Tobacco Use  Smoking Status Former  Types: Cigarettes  Smokeless Tobacco Never  Marital status: Widowed  Tobacco Use  Smoking status: Former  Types: Cigarettes  Smokeless tobacco: Never  Vaping Use  Vaping status: Never Used  Substance and Sexual Activity  Alcohol use: Never  Drug use: Never   Objective:   Vitals:  05/06/24 0944 05/06/24 0945  BP: 99/63  Pulse: 103  Temp: 36.4 C (97.6 F)  SpO2: 99%  Weight: 58.2 kg (128 lb 6.4 oz)  Height: 154.9 cm (5' 1)  PainSc: 0-No pain   Body mass index is 24.26 kg/m.  Physical Exam Vitals reviewed.   Constitutional:  Appearance: Normal appearance.  Eyes:  General: No scleral icterus. Abdominal:  General: There is no distension.  Palpations: Abdomen is soft.  Tenderness: There is no abdominal tenderness.  Comments: Healed scars s/p lap appy  Neurological:  Mental Status: She is alert.   Assessment and Plan:   Calculus of gallbladder without cholecystitis without obstruction  Laparoscopic cholecystectomy  I do think symptoms related to her gallbladder and recommended surgery I discussed the procedure in detail. We discussed the risks and benefits of a laparoscopic cholecystectomy and possible cholangiogram including, but not limited to bleeding, infection, injury to surrounding structures such as the intestine or liver, bile leak, retained gallstones, need to convert to an open procedure, prolonged diarrhea, blood clots such as DVT, common bile duct injury, anesthesia risks, and possible need for additional procedures. We also discussed small risk of subtotal cholecystectomy. The likelihood of improvement in symptoms and return to the patient's normal status is good. We discussed the typical post-operative recovery course

## 2024-06-05 ENCOUNTER — Encounter (HOSPITAL_COMMUNITY): Payer: Self-pay | Admitting: General Surgery

## 2024-06-06 ENCOUNTER — Encounter (HOSPITAL_COMMUNITY): Payer: Self-pay | Admitting: Vascular Surgery

## 2024-06-06 ENCOUNTER — Ambulatory Visit (HOSPITAL_COMMUNITY): Payer: Self-pay

## 2024-06-06 ENCOUNTER — Other Ambulatory Visit: Payer: Self-pay

## 2024-06-06 ENCOUNTER — Ambulatory Visit (HOSPITAL_COMMUNITY)
Admission: RE | Admit: 2024-06-06 | Discharge: 2024-06-06 | Disposition: A | Discharge status: Home / Self Care | Attending: General Surgery | Admitting: General Surgery

## 2024-06-06 ENCOUNTER — Encounter (HOSPITAL_COMMUNITY): Payer: Self-pay | Admitting: General Surgery

## 2024-06-06 ENCOUNTER — Encounter (HOSPITAL_COMMUNITY)
Admission: RE | Disposition: A | Discharge status: Home / Self Care | Payer: Self-pay | Source: Home / Self Care | Attending: General Surgery

## 2024-06-06 DIAGNOSIS — Z79899 Other long term (current) drug therapy: Secondary | ICD-10-CM | POA: Diagnosis not present

## 2024-06-06 DIAGNOSIS — K811 Chronic cholecystitis: Secondary | ICD-10-CM

## 2024-06-06 DIAGNOSIS — I10 Essential (primary) hypertension: Secondary | ICD-10-CM | POA: Diagnosis not present

## 2024-06-06 DIAGNOSIS — K8064 Calculus of gallbladder and bile duct with chronic cholecystitis without obstruction: Secondary | ICD-10-CM | POA: Insufficient documentation

## 2024-06-06 DIAGNOSIS — K219 Gastro-esophageal reflux disease without esophagitis: Secondary | ICD-10-CM | POA: Diagnosis not present

## 2024-06-06 DIAGNOSIS — K8020 Calculus of gallbladder without cholecystitis without obstruction: Secondary | ICD-10-CM | POA: Diagnosis present

## 2024-06-06 HISTORY — PX: CHOLECYSTECTOMY: SHX55

## 2024-06-06 SURGERY — LAPAROSCOPIC CHOLECYSTECTOMY
Anesthesia: General

## 2024-06-06 MED ORDER — FENTANYL CITRATE (PF) 100 MCG/2ML IJ SOLN
25.0000 ug | INTRAMUSCULAR | Status: DC | PRN
  Administered 2024-06-06: 50 ug via INTRAVENOUS
  Administered 2024-06-06: 25 ug via INTRAVENOUS

## 2024-06-06 MED ORDER — FENTANYL CITRATE (PF) 100 MCG/2ML IJ SOLN: 100.0000 ug | Freq: Once | INTRAMUSCULAR | Status: AC

## 2024-06-06 MED ORDER — ENSURE PRE-SURGERY PO LIQD: 296.0000 mL | Freq: Once | ORAL | Status: DC

## 2024-06-06 MED ORDER — LIDOCAINE 2% (20 MG/ML) 5 ML SYRINGE
INTRAMUSCULAR | Status: AC
  Filled 2024-06-06: qty 5

## 2024-06-06 MED ORDER — BUPIVACAINE-EPINEPHRINE (PF) 0.25% -1:200000 IJ SOLN
INTRAMUSCULAR | Status: DC | PRN
  Administered 2024-06-06: 40 mL

## 2024-06-06 MED ORDER — FENTANYL CITRATE (PF) 250 MCG/5ML IJ SOLN
INTRAMUSCULAR | Status: DC | PRN
  Administered 2024-06-06: 50 ug via INTRAVENOUS

## 2024-06-06 MED ORDER — ACETAMINOPHEN 325 MG PO TABS: 650.0000 mg | ORAL_TABLET | ORAL | Status: DC | PRN

## 2024-06-06 MED ORDER — FENTANYL CITRATE (PF) 100 MCG/2ML IJ SOLN
INTRAMUSCULAR | Status: AC
  Filled 2024-06-06: qty 2

## 2024-06-06 MED ORDER — SUGAMMADEX SODIUM 200 MG/2ML IV SOLN
INTRAVENOUS | Status: DC | PRN
Start: 2024-06-06 — End: 2024-06-06
  Administered 2024-06-06: 200 mg via INTRAVENOUS

## 2024-06-06 MED ORDER — PHENYLEPHRINE HCL-NACL 20-0.9 MG/250ML-% IV SOLN
INTRAVENOUS | Status: DC | PRN
Start: 2024-06-06 — End: 2024-06-06
  Administered 2024-06-06: 80 ug via INTRAVENOUS

## 2024-06-06 MED ORDER — PHENYLEPHRINE 80 MCG/ML (10ML) SYRINGE FOR IV PUSH (FOR BLOOD PRESSURE SUPPORT)
PREFILLED_SYRINGE | INTRAVENOUS | Status: AC
  Filled 2024-06-06: qty 10

## 2024-06-06 MED ORDER — AMISULPRIDE (ANTIEMETIC) 5 MG/2ML IV SOLN
10.0000 mg | Freq: Once | INTRAVENOUS | Status: AC | PRN
  Administered 2024-06-06: 10 mg via INTRAVENOUS

## 2024-06-06 MED ORDER — CHLORHEXIDINE GLUCONATE CLOTH 2 % EX PADS: 6.0000 | MEDICATED_PAD | Freq: Once | CUTANEOUS | Status: DC

## 2024-06-06 MED ORDER — PHENYLEPHRINE 80 MCG/ML (10ML) SYRINGE FOR IV PUSH (FOR BLOOD PRESSURE SUPPORT)
PREFILLED_SYRINGE | INTRAVENOUS | Status: DC | PRN
  Administered 2024-06-06: 160 ug via INTRAVENOUS
  Administered 2024-06-06 (×2): 80 ug via INTRAVENOUS
  Administered 2024-06-06: 160 ug via INTRAVENOUS
  Administered 2024-06-06: 80 ug via INTRAVENOUS

## 2024-06-06 MED ORDER — OXYCODONE HCL 5 MG/5ML PO SOLN: 5.0000 mg | Freq: Once | ORAL | Status: AC | PRN

## 2024-06-06 MED ORDER — SODIUM CHLORIDE 0.9% FLUSH: 3.0000 mL | INTRAVENOUS | Status: DC | PRN

## 2024-06-06 MED ORDER — ACETAMINOPHEN 650 MG RE SUPP: 650.0000 mg | RECTAL | Status: DC | PRN

## 2024-06-06 MED ORDER — CHLORHEXIDINE GLUCONATE 0.12 % MT SOLN
15.0000 mL | Freq: Once | OROMUCOSAL | Status: AC
  Administered 2024-06-06: 15 mL via OROMUCOSAL
  Filled 2024-06-06: qty 15

## 2024-06-06 MED ORDER — EPHEDRINE 5 MG/ML INJ
INTRAVENOUS | Status: AC
  Filled 2024-06-06: qty 5

## 2024-06-06 MED ORDER — BUPIVACAINE HCL (PF) 0.25 % IJ SOLN
INTRAMUSCULAR | Status: AC
  Filled 2024-06-06: qty 30

## 2024-06-06 MED ORDER — OXYCODONE HCL 5 MG PO TABS: 5.0000 mg | ORAL_TABLET | ORAL | Status: DC | PRN

## 2024-06-06 MED ORDER — PROPOFOL 10 MG/ML IV BOLUS
INTRAVENOUS | Status: AC
  Filled 2024-06-06: qty 20

## 2024-06-06 MED ORDER — DEXAMETHASONE SOD PHOSPHATE PF 10 MG/ML IJ SOLN
INTRAMUSCULAR | Status: DC | PRN
Start: 2024-06-06 — End: 2024-06-06
  Administered 2024-06-06: 10 mg via INTRAVENOUS

## 2024-06-06 MED ORDER — ORAL CARE MOUTH RINSE: 15.0000 mL | Freq: Once | OROMUCOSAL | Status: AC

## 2024-06-06 MED ORDER — MIDAZOLAM HCL 2 MG/2ML IJ SOLN
INTRAMUSCULAR | Status: AC
  Filled 2024-06-06: qty 2

## 2024-06-06 MED ORDER — ONDANSETRON HCL 4 MG/2ML IJ SOLN
INTRAMUSCULAR | Status: AC
  Filled 2024-06-06: qty 2

## 2024-06-06 MED ORDER — ROCURONIUM BROMIDE 10 MG/ML (PF) SYRINGE
PREFILLED_SYRINGE | INTRAVENOUS | Status: DC | PRN
  Administered 2024-06-06: 60 mg via INTRAVENOUS

## 2024-06-06 MED ORDER — ROCURONIUM BROMIDE 10 MG/ML (PF) SYRINGE
PREFILLED_SYRINGE | INTRAVENOUS | Status: AC
  Filled 2024-06-06: qty 10

## 2024-06-06 MED ORDER — EPHEDRINE SULFATE-NACL 50-0.9 MG/10ML-% IV SOSY
PREFILLED_SYRINGE | INTRAVENOUS | Status: DC | PRN
  Administered 2024-06-06 (×5): 5 mg via INTRAVENOUS

## 2024-06-06 MED ORDER — OXYCODONE HCL 5 MG PO TABS
ORAL_TABLET | ORAL | Status: AC
  Filled 2024-06-06: qty 1

## 2024-06-06 MED ORDER — AMISULPRIDE (ANTIEMETIC) 5 MG/2ML IV SOLN
INTRAVENOUS | Status: AC
  Filled 2024-06-06: qty 4

## 2024-06-06 MED ORDER — SODIUM CHLORIDE 0.9 % IV SOLN: 250.0000 mL | INTRAVENOUS | Status: DC | PRN

## 2024-06-06 MED ORDER — FENTANYL CITRATE (PF) 100 MCG/2ML IJ SOLN
INTRAMUSCULAR | Status: AC
  Administered 2024-06-06: 100 ug via INTRAVENOUS
  Filled 2024-06-06: qty 2

## 2024-06-06 MED ORDER — OXYCODONE HCL 5 MG PO TABS: 5.0000 mg | ORAL_TABLET | Freq: Four times a day (QID) | ORAL | 0 refills | Status: AC | PRN

## 2024-06-06 MED ORDER — OXYCODONE HCL 5 MG PO TABS
5.0000 mg | ORAL_TABLET | Freq: Once | ORAL | Status: AC | PRN
  Administered 2024-06-06: 5 mg via ORAL

## 2024-06-06 MED ORDER — ONDANSETRON HCL 4 MG/2ML IJ SOLN
INTRAMUSCULAR | Status: DC | PRN
  Administered 2024-06-06: 4 mg via INTRAVENOUS

## 2024-06-06 MED ORDER — SPY AGENT GREEN - (INDOCYANINE FOR INJECTION)
1.2500 mg | Freq: Once | INTRAMUSCULAR | Status: AC
  Administered 2024-06-06: 1.25 mg via INTRAVENOUS

## 2024-06-06 MED ORDER — PROPOFOL 10 MG/ML IV BOLUS
INTRAVENOUS | Status: DC | PRN
Start: 2024-06-06 — End: 2024-06-06
  Administered 2024-06-06: 20 mg via INTRAVENOUS
  Administered 2024-06-06: 80 mg via INTRAVENOUS

## 2024-06-06 MED ORDER — LIDOCAINE 2% (20 MG/ML) 5 ML SYRINGE
INTRAMUSCULAR | Status: DC | PRN
  Administered 2024-06-06: 100 mg via INTRAVENOUS

## 2024-06-06 MED ORDER — CEFAZOLIN SODIUM-DEXTROSE 2-4 GM/100ML-% IV SOLN
2.0000 g | INTRAVENOUS | Status: AC
  Administered 2024-06-06: 2 g via INTRAVENOUS
  Filled 2024-06-06: qty 100

## 2024-06-06 MED ORDER — ONDANSETRON HCL 4 MG/2ML IJ SOLN
4.0000 mg | Freq: Once | INTRAMUSCULAR | Status: AC | PRN
  Administered 2024-06-06: 4 mg via INTRAVENOUS

## 2024-06-06 MED ORDER — ACETAMINOPHEN 500 MG PO TABS
1000.0000 mg | ORAL_TABLET | ORAL | Status: AC
  Administered 2024-06-06: 1000 mg via ORAL
  Filled 2024-06-06: qty 2

## 2024-06-06 MED ORDER — BUPIVACAINE HCL (PF) 0.25 % IJ SOLN
INTRAMUSCULAR | Status: DC | PRN
  Administered 2024-06-06: 10 mL

## 2024-06-06 MED ORDER — MIDAZOLAM HCL (PF) 2 MG/2ML IJ SOLN
INTRAMUSCULAR | Status: DC | PRN
Start: 2024-06-06 — End: 2024-06-06
  Administered 2024-06-06 (×2): 1 mg via INTRAVENOUS

## 2024-06-06 MED ORDER — KETAMINE HCL 50 MG/5ML IJ SOSY
PREFILLED_SYRINGE | INTRAMUSCULAR | Status: AC
Start: 2024-06-06 — End: 2024-06-06
  Filled 2024-06-06: qty 5

## 2024-06-06 MED ORDER — ACETAMINOPHEN 10 MG/ML IV SOLN: 1000.0000 mg | Freq: Once | INTRAVENOUS | Status: DC | PRN

## 2024-06-06 MED ORDER — LACTATED RINGERS IV SOLN: INTRAVENOUS | Status: DC

## 2024-06-06 SURGICAL SUPPLY — 32 items
BAG COUNTER SPONGE SURGICOUNT (BAG) ×1 IMPLANT
BLADE CLIPPER SURG (BLADE) IMPLANT
CANISTER SUCTION 3000ML PPV (SUCTIONS) ×1 IMPLANT
CHLORAPREP W/TINT 26 (MISCELLANEOUS) ×1 IMPLANT
CLIP APPLIE 5 13 M/L LIGAMAX5 (MISCELLANEOUS) ×1 IMPLANT
COVER SURGICAL LIGHT HANDLE (MISCELLANEOUS) ×1 IMPLANT
DERMABOND ADVANCED .7 DNX12 (GAUZE/BANDAGES/DRESSINGS) ×1 IMPLANT
ELECTRODE REM PT RTRN 9FT ADLT (ELECTROSURGICAL) ×1 IMPLANT
GLOVE BIO SURGEON STRL SZ7 (GLOVE) ×1 IMPLANT
GLOVE BIOGEL PI IND STRL 7.5 (GLOVE) ×1 IMPLANT
GOWN STRL REUS W/ TWL LRG LVL3 (GOWN DISPOSABLE) ×3 IMPLANT
GRASPER SUT TROCAR 14GX15 (MISCELLANEOUS) ×1 IMPLANT
IRRIGATION SUCT STRKRFLW 2 WTP (MISCELLANEOUS) ×1 IMPLANT
KIT BASIN OR (CUSTOM PROCEDURE TRAY) ×1 IMPLANT
KIT IMAGING PINPOINTPAQ (MISCELLANEOUS) IMPLANT
KIT TURNOVER KIT B (KITS) ×1 IMPLANT
PAD ARMBOARD POSITIONER FOAM (MISCELLANEOUS) ×1 IMPLANT
POUCH RETRIEVAL ECOSAC 10 (ENDOMECHANICALS) ×1 IMPLANT
SCISSORS LAP 5X35 DISP (ENDOMECHANICALS) ×1 IMPLANT
SET TUBE SMOKE EVAC HIGH FLOW (TUBING) ×1 IMPLANT
SLEEVE Z-THREAD 5X100MM (TROCAR) ×2 IMPLANT
SOLN 0.9% NACL POUR BTL 1000ML (IV SOLUTION) ×1 IMPLANT
SOLN STERILE WATER BTL 1000 ML (IV SOLUTION) ×1 IMPLANT
STRIP CLOSURE SKIN 1/2X4 (GAUZE/BANDAGES/DRESSINGS) ×1 IMPLANT
SUT MNCRL AB 4-0 PS2 18 (SUTURE) ×1 IMPLANT
SUT VICRYL 0 UR6 27IN ABS (SUTURE) ×1 IMPLANT
TOWEL GREEN STERILE (TOWEL DISPOSABLE) ×1 IMPLANT
TOWEL GREEN STERILE FF (TOWEL DISPOSABLE) ×1 IMPLANT
TRAY LAPAROSCOPIC MC (CUSTOM PROCEDURE TRAY) ×1 IMPLANT
TROCAR BALLN 12MMX100 BLUNT (TROCAR) ×1 IMPLANT
TROCAR Z-THREAD OPTICAL 5X100M (TROCAR) ×1 IMPLANT
WARMER LAPAROSCOPE (MISCELLANEOUS) ×1 IMPLANT

## 2024-06-06 NOTE — Op Note (Signed)
  Preoperative diagnosis: biliary colic Postoperative diagnosis chronic cholecystitis Procedure: Laparoscopic cholecystectomy Surgeon: Dr. Adina Bury Anesthesia: General  Complications: None Drains: None Estimated blood loss: Minimal Specimens: Gallbladder and contents to pathology Sponge count was correct at completion Disposition recovery stable   Indications:  73 year old female I know from a laparoscopic appendectomy last year. She presents with a recent emergency room visit for abdominal pain. This was more diffuse but ended up localizing more to her right upper quadrant. This was associated with nausea. This did radiate to her back as well. She underwent an evaluation that showed normal liver function tests. She initially underwent a CT scan that showed a distended gallbladder with no abnormal wall thickening. There is a trace amount of fluid between the gallbladder and the liver capsule. She underwent a ultrasound that showed cholelithiasis that she did have some wall thickening and Perry cholecystic fluid. Her common bile duct was noted to be 10 mm. She had resolution of her pain while she was in the emergency room and was sent home for outpatient evaluation. She has not been eating much since she was there on October 8. S We discussed lap chole.    Procedure: After informed consent was obtained she was taken to the operating room.  She was given antibiotics.  She was given ICG dye. SCDs were placed.  She was placed under anesthesia without complication.  She was prepped and draped in a standard sterile surgical fashion.  A surgical timeout was then performed.   I infiltrated marcaine  below the umbilicus and made a vertical incision. I then grasped the fascia and incised it.  I entered the peritoneum bluntly and placed a 0 vicryl pursestring suture.  I then inserted a hasson trocar and insufflated the abdomen to 15 mm Hg pressure. I then inserted three additional 5 mm trocars in the  epigastrium and right side of the abdomen. The gallbladder was then retracted cephalad and lateral. She did have evidence of chronic cholecystitis. The gallbladder was tense and I had to aspirate this to grasp the gallbladder.  She had some omental adhesions that I took down with a combination of cautery and blunt dissectoin.   I was then able to dissect the triangle and clearly obtain a critical view of safety.  I confirmed my anatomy with the ICG dye as I saw the common duct and the cystic duct. I took the gallbladder off the liver bed for some distance just to confirm this clearly obtaining the critical view of safety.  I then clipped the cystic artery and then divided it leaving two clips in place.   I then clipped the duct with three clips. I divided the duct leaving two in place. The clips completely traversed the duct and the duct was viable.the gallbladder was then removed from the liver bed. The duct was short on the cbd so I did not do a cholangiogram.  The gallbladder was then placed in a retrieval bag.  I obtained hemostasis. I then removed the gallbladder in the retrieval bag.  The umbilical  trocar was removed.I tied my pursestring down and then placed an additional 0 vicryl suture to completely obliterate the defect.  I then removed the remaining trocars and desufflated the abdomen.  These were closed with 4-0 Monocryl and glue.  She tolerated this well was extubated and transferred recovery stable

## 2024-06-06 NOTE — Anesthesia Procedure Notes (Signed)
 Procedure Name: Intubation Date/Time: 06/06/2024 11:43 AM  Performed by: Erlene Powell POUR, CRNAPre-anesthesia Checklist: Patient identified, Emergency Drugs available, Suction available and Patient being monitored Patient Re-evaluated:Patient Re-evaluated prior to induction Oxygen Delivery Method: Circle system utilized Preoxygenation: Pre-oxygenation with 100% oxygen Induction Type: IV induction Ventilation: Mask ventilation without difficulty Laryngoscope Size: Glidescope and 3 Grade View: Grade I Tube type: Oral Tube size: 6.5 mm Number of attempts: 1 Airway Equipment and Method: Stylet Placement Confirmation: ETT inserted through vocal cords under direct vision, positive ETCO2 and breath sounds checked- equal and bilateral Secured at: 21 cm Tube secured with: Tape Dental Injury: Teeth and Oropharynx as per pre-operative assessment  Difficulty Due To: Difficulty was anticipated Comments: TMJ, limited mouth opening.

## 2024-06-06 NOTE — Discharge Instructions (Signed)
 [facility] LAPAROSCOPIC SURGERY: POST OP INSTRUCTIONS  Always review your discharge instruction sheet given to you by the facility where your surgery was performed. IF YOU HAVE DISABILITY OR FAMILY LEAVE FORMS, YOU MUST BRING THEM TO THE OFFICE FOR PROCESSING.   DO NOT GIVE THEM TO YOUR DOCTOR.  A prescription for pain medication may be given to you upon discharge.  Take your pain medication as prescribed, if needed.  If narcotic pain medicine is not needed, then you may take acetaminophen  (Tylenol ), naprosyn (Alleve), or ibuprofen  (Advil ) as needed. Take your usually prescribed medications unless otherwise directed. If you need a refill on your pain medication, please contact your pharmacy.  They will contact our office to request authorization. Prescriptions will not be filled after 5pm or on week-ends. You should follow a light diet the first few days after arrival home, such as soup and crackers, etc.  Be sure to include lots of fluids daily. Most patients will experience some swelling and bruising in the area of the incisions.  Ice packs will help.  Swelling and bruising can take several days to resolve.  It is common to experience some constipation if taking pain medication after surgery.  Increasing fluid intake and taking a stool softener (such as Colace) will usually help or prevent this problem from occurring.  A mild laxative (Milk of Magnesia or Miralax) should be taken according to package instructions if there are no bowel movements after 48 hours. Unless discharge instructions indicate otherwise, you may remove your bandages 48 hours after surgery, and you may shower at that time.  You may have steri-strips (small skin tapes) in place directly over the incision.  These strips should be left on the skin for 7-10 days.  If your surgeon used skin glue on the incision, you may shower in 24 hours.  The glue will flake off over the next  2-3 weeks.  Any sutures or staples will be removed at the office during your follow-up visit. ACTIVITIES:  You may resume regular (light) daily activities beginning the next day--such as daily self-care, walking, climbing stairs--gradually increasing activities as tolerated.  You may have sexual intercourse when it is comfortable.  Refrain from any heavy lifting or straining until approved by your doctor. You may drive when you are no longer taking prescription pain medication, you can comfortably wear a seatbelt, and you can safely maneuver your car and apply brakes. RETURN TO WORK:  __________________________________________________________ Rosine should see your doctor in the office for a follow-up appointment approximately 2-3 weeks after your surgery.  Make sure that you call for this appointment within a day or two after you arrive home to insure a convenient appointment time. OTHER INSTRUCTIONS: __________________________________________________________________________________________________________________________ __________________________________________________________________________________________________________________________ WHEN TO CALL YOUR DOCTOR: Fever over 101.0 Inability to urinate Continued bleeding from incision. Increased pain, redness, or drainage from the incision. Increasing abdominal pain  The clinic staff is available to answer your questions during regular business hours.  Please don't hesitate to call and ask to speak to one of the nurses for clinical concerns.  If you have a medical emergency, go to the nearest emergency room or call 911.  A surgeon from [facility] is always on call at the hospital. [address], [address], [place], [place]  [address] ? [address] [address], [place], [place]   [address] [phone] ? [phone] ? FAX [phone] Web site: [email or web address]

## 2024-06-06 NOTE — Anesthesia Procedure Notes (Signed)
 Anesthesia Regional Block: TAP block   Pre-Anesthetic Checklist: , timeout performed,  Correct Patient, Correct Site, Correct Laterality,  Correct Procedure, Correct Position, site marked,  Risks and benefits discussed,  At surgeon's request and post-op pain management  Laterality: Lower, Left, Right and Upper  Prep: chloraprep       Needles:  Injection technique: Single-shot  Needle Type: Echogenic Stimulator Needle     Needle Length: 9cm  Needle Gauge: 20     Additional Needles:   Procedures:,,,, ultrasound used (permanent image in chart),, #20gu IV placed    Narrative:  Start time: 06/06/2024 11:05 AM End time: 06/06/2024 11:05 AM Injection made incrementally with aspirations every 5 mL.  Performed by: Personally  Anesthesiologist: Waddell Lauraine NOVAK, MD

## 2024-06-06 NOTE — Anesthesia Postprocedure Evaluation (Signed)
 Anesthesia Post Note  Patient: Tonya Pope  Procedure(s) Performed: LAPAROSCOPIC CHOLECYSTECTOMY WITH ICG DYE     Patient location during evaluation: PACU Anesthesia Type: General Level of consciousness: awake Pain management: pain level controlled Vital Signs Assessment: post-procedure vital signs reviewed and stable Respiratory status: spontaneous breathing Cardiovascular status: blood pressure returned to baseline Postop Assessment: no apparent nausea or vomiting Anesthetic complications: no   No notable events documented.  Last Vitals:  Vitals:   06/06/24 1415 06/06/24 1430  BP: (!) 126/57 (!) 130/58  Pulse: 75 89  Resp: 12 18  Temp:    SpO2: 98% 93%    Last Pain:  Vitals:   06/06/24 1400  TempSrc:   PainSc: 4                  Lauraine KATHEE [place]

## 2024-06-06 NOTE — Interval H&P Note (Signed)
 History and Physical Interval Note:  06/06/2024 10:48 AM  Tonya Pope  has presented today for surgery, with the diagnosis of GALLSTONES.  The various methods of treatment have been discussed with the patient and family. After consideration of risks, benefits and other options for treatment, the patient has consented to  Procedure(s) with comments: LAPAROSCOPIC CHOLECYSTECTOMY (N/A) - ICG DYE as a surgical intervention.  The patient's history has been reviewed, patient examined, no change in status, stable for surgery.  I have reviewed the patient's chart and labs.  Questions were answered to the patient's satisfaction.     [name]

## 2024-06-06 NOTE — Transfer of Care (Signed)
 Immediate Anesthesia Transfer of Care Note  Patient: Tonya Pope  Procedure(s) Performed: LAPAROSCOPIC CHOLECYSTECTOMY WITH ICG DYE  Patient Location: PACU  Anesthesia Type:General  Level of Consciousness: awake and oriented  Airway & Oxygen Therapy: Patient Spontanous Breathing and Patient connected to face mask oxygen  Post-op Assessment: Report given to RN, Post -op Vital signs reviewed and stable, and Patient moving all extremities  Post vital signs: Reviewed and stable  Last Vitals:  Vitals Value Taken Time  BP 136/78 06/06/24 12:52  Temp 36.6 C 06/06/24 12:52  Pulse 88 06/06/24 12:53  Resp 21 06/06/24 12:53  SpO2 95 % 06/06/24 12:53  Vitals shown include unfiled device data.  Last Pain:  Vitals:   06/06/24 1003  TempSrc:   PainSc: 0-No pain         Complications: No notable events documented.

## 2024-06-07 ENCOUNTER — Encounter (HOSPITAL_COMMUNITY): Payer: Self-pay | Admitting: General Surgery

## 2024-06-10 LAB — SURGICAL PATHOLOGY
